# Patient Record
Sex: Female | Born: 1935 | Race: White | Hispanic: No | Marital: Married | Smoking: Never smoker
Health system: Southern US, Community
[De-identification: ages and names within clinical notes are randomized; demographics above are authoritative.]

## PROBLEM LIST (undated history)

## (undated) DIAGNOSIS — M1990 Unspecified osteoarthritis, unspecified site: Secondary | ICD-10-CM

## (undated) DIAGNOSIS — G709 Myoneural disorder, unspecified: Secondary | ICD-10-CM

## (undated) DIAGNOSIS — E785 Hyperlipidemia, unspecified: Secondary | ICD-10-CM

## (undated) DIAGNOSIS — R222 Localized swelling, mass and lump, trunk: Secondary | ICD-10-CM

## (undated) DIAGNOSIS — F419 Anxiety disorder, unspecified: Secondary | ICD-10-CM

## (undated) DIAGNOSIS — C801 Malignant (primary) neoplasm, unspecified: Secondary | ICD-10-CM

## (undated) HISTORY — DX: Malignant (primary) neoplasm, unspecified: C80.1

## (undated) HISTORY — PX: ABDOMINAL HYSTERECTOMY: SHX81

## (undated) HISTORY — PX: JOINT REPLACEMENT: SHX530

## (undated) HISTORY — DX: Unspecified osteoarthritis, unspecified site: M19.90

## (undated) HISTORY — DX: Anxiety disorder, unspecified: F41.9

## (undated) HISTORY — DX: Localized swelling, mass and lump, trunk: R22.2

## (undated) HISTORY — DX: Hyperlipidemia, unspecified: E78.5

## (undated) HISTORY — PX: CHOLECYSTECTOMY: SHX55

## (undated) HISTORY — PX: APPENDECTOMY: SHX54

## (undated) HISTORY — PX: EYE SURGERY: SHX253

## (undated) HISTORY — DX: Myoneural disorder, unspecified: G70.9

## (undated) HISTORY — PX: BREAST SURGERY: SHX581

---

## 1998-07-09 ENCOUNTER — Encounter: Admission: RE | Admit: 1998-07-09 | Discharge: 1998-07-09 | Payer: Self-pay | Admitting: Family Medicine

## 1998-07-13 ENCOUNTER — Encounter: Admission: RE | Admit: 1998-07-13 | Discharge: 1998-07-13 | Payer: Self-pay | Admitting: Sports Medicine

## 1998-09-09 ENCOUNTER — Encounter: Admission: RE | Admit: 1998-09-09 | Discharge: 1998-09-09 | Payer: Self-pay | Admitting: Family Medicine

## 1998-09-27 ENCOUNTER — Ambulatory Visit (HOSPITAL_BASED_OUTPATIENT_CLINIC_OR_DEPARTMENT_OTHER): Admission: RE | Admit: 1998-09-27 | Discharge: 1998-09-27 | Payer: Self-pay | Admitting: *Deleted

## 1998-10-12 ENCOUNTER — Inpatient Hospital Stay (HOSPITAL_COMMUNITY): Admission: RE | Admit: 1998-10-12 | Discharge: 1998-10-13 | Payer: Self-pay | Admitting: *Deleted

## 1998-10-12 ENCOUNTER — Encounter: Payer: Self-pay | Admitting: *Deleted

## 1999-08-25 ENCOUNTER — Encounter: Admission: RE | Admit: 1999-08-25 | Discharge: 1999-08-25 | Payer: Self-pay | Admitting: Family Medicine

## 1999-09-01 ENCOUNTER — Encounter: Admission: RE | Admit: 1999-09-01 | Discharge: 1999-09-01 | Payer: Self-pay | Admitting: Sports Medicine

## 1999-09-01 ENCOUNTER — Encounter: Payer: Self-pay | Admitting: Sports Medicine

## 2000-01-12 ENCOUNTER — Encounter: Admission: RE | Admit: 2000-01-12 | Discharge: 2000-01-12 | Payer: Self-pay | Admitting: Hematology and Oncology

## 2000-01-12 ENCOUNTER — Encounter: Payer: Self-pay | Admitting: Hematology and Oncology

## 2000-06-21 ENCOUNTER — Encounter: Admission: RE | Admit: 2000-06-21 | Discharge: 2000-06-21 | Payer: Self-pay | Admitting: Family Medicine

## 2000-06-21 ENCOUNTER — Ambulatory Visit (HOSPITAL_COMMUNITY): Admission: RE | Admit: 2000-06-21 | Discharge: 2000-06-21 | Payer: Self-pay | Admitting: Family Medicine

## 2000-08-13 ENCOUNTER — Encounter: Admission: RE | Admit: 2000-08-13 | Discharge: 2000-08-13 | Payer: Self-pay | Admitting: Family Medicine

## 2000-09-13 ENCOUNTER — Encounter: Admission: RE | Admit: 2000-09-13 | Discharge: 2000-09-13 | Payer: Self-pay | Admitting: Hematology and Oncology

## 2000-09-13 ENCOUNTER — Encounter: Payer: Self-pay | Admitting: Hematology and Oncology

## 2001-01-24 ENCOUNTER — Ambulatory Visit (HOSPITAL_COMMUNITY): Admission: RE | Admit: 2001-01-24 | Discharge: 2001-01-24 | Payer: Self-pay | Admitting: Hematology and Oncology

## 2001-01-24 ENCOUNTER — Encounter: Payer: Self-pay | Admitting: Hematology and Oncology

## 2001-03-01 ENCOUNTER — Encounter: Admission: RE | Admit: 2001-03-01 | Discharge: 2001-03-01 | Payer: Self-pay | Admitting: Family Medicine

## 2001-03-14 ENCOUNTER — Encounter: Admission: RE | Admit: 2001-03-14 | Discharge: 2001-03-14 | Payer: Self-pay | Admitting: Family Medicine

## 2001-04-04 ENCOUNTER — Encounter: Admission: RE | Admit: 2001-04-04 | Discharge: 2001-04-04 | Payer: Self-pay | Admitting: Family Medicine

## 2001-04-12 ENCOUNTER — Encounter: Admission: RE | Admit: 2001-04-12 | Discharge: 2001-07-11 | Payer: Self-pay | Admitting: Family Medicine

## 2001-04-18 ENCOUNTER — Encounter: Admission: RE | Admit: 2001-04-18 | Discharge: 2001-04-18 | Payer: Self-pay | Admitting: Family Medicine

## 2001-05-03 ENCOUNTER — Encounter: Admission: RE | Admit: 2001-05-03 | Discharge: 2001-05-03 | Payer: Self-pay | Admitting: Family Medicine

## 2001-05-09 ENCOUNTER — Encounter: Admission: RE | Admit: 2001-05-09 | Discharge: 2001-05-09 | Payer: Self-pay | Admitting: Family Medicine

## 2001-08-06 ENCOUNTER — Encounter: Admission: RE | Admit: 2001-08-06 | Discharge: 2001-11-04 | Payer: Self-pay | Admitting: Family Medicine

## 2001-09-16 ENCOUNTER — Encounter: Admission: RE | Admit: 2001-09-16 | Discharge: 2001-09-16 | Payer: Self-pay | Admitting: Hematology and Oncology

## 2001-09-16 ENCOUNTER — Encounter: Payer: Self-pay | Admitting: Hematology and Oncology

## 2001-11-05 ENCOUNTER — Encounter: Admission: RE | Admit: 2001-11-05 | Discharge: 2002-02-03 | Payer: Self-pay | Admitting: Family Medicine

## 2002-02-04 ENCOUNTER — Encounter: Admission: RE | Admit: 2002-02-04 | Discharge: 2002-05-05 | Payer: Self-pay | Admitting: Family Medicine

## 2002-03-19 ENCOUNTER — Encounter: Admission: RE | Admit: 2002-03-19 | Discharge: 2002-03-19 | Payer: Self-pay | Admitting: Family Medicine

## 2002-03-25 ENCOUNTER — Encounter: Admission: RE | Admit: 2002-03-25 | Discharge: 2002-03-25 | Payer: Self-pay | Admitting: Family Medicine

## 2002-06-02 ENCOUNTER — Encounter: Admission: RE | Admit: 2002-06-02 | Discharge: 2002-08-12 | Payer: Self-pay | Admitting: Family Medicine

## 2002-09-29 ENCOUNTER — Encounter: Admission: RE | Admit: 2002-09-29 | Discharge: 2002-09-29 | Payer: Self-pay | Admitting: *Deleted

## 2002-09-29 ENCOUNTER — Encounter: Payer: Self-pay | Admitting: *Deleted

## 2003-01-12 ENCOUNTER — Encounter: Admission: RE | Admit: 2003-01-12 | Discharge: 2003-01-12 | Payer: Self-pay | Admitting: Family Medicine

## 2003-02-25 ENCOUNTER — Ambulatory Visit (HOSPITAL_COMMUNITY): Admission: RE | Admit: 2003-02-25 | Discharge: 2003-02-25 | Payer: Self-pay | Admitting: *Deleted

## 2003-02-25 ENCOUNTER — Encounter: Payer: Self-pay | Admitting: *Deleted

## 2003-04-20 ENCOUNTER — Encounter: Admission: RE | Admit: 2003-04-20 | Discharge: 2003-04-20 | Payer: Self-pay | Admitting: Family Medicine

## 2003-08-19 ENCOUNTER — Encounter: Admission: RE | Admit: 2003-08-19 | Discharge: 2003-08-19 | Payer: Self-pay | Admitting: Family Medicine

## 2003-09-03 ENCOUNTER — Encounter: Admission: RE | Admit: 2003-09-03 | Discharge: 2003-09-03 | Payer: Self-pay | Admitting: Sports Medicine

## 2003-10-02 ENCOUNTER — Encounter: Admission: RE | Admit: 2003-10-02 | Discharge: 2003-10-02 | Payer: Self-pay | Admitting: Oncology

## 2004-01-04 ENCOUNTER — Inpatient Hospital Stay (HOSPITAL_COMMUNITY): Admission: RE | Admit: 2004-01-04 | Discharge: 2004-01-08 | Payer: Self-pay | Admitting: Orthopedic Surgery

## 2004-02-05 ENCOUNTER — Other Ambulatory Visit: Admission: RE | Admit: 2004-02-05 | Discharge: 2004-02-05 | Payer: Self-pay | Admitting: Oncology

## 2004-04-25 ENCOUNTER — Encounter: Admission: RE | Admit: 2004-04-25 | Discharge: 2004-04-25 | Payer: Self-pay | Admitting: Family Medicine

## 2004-05-18 ENCOUNTER — Encounter: Admission: RE | Admit: 2004-05-18 | Discharge: 2004-05-18 | Payer: Self-pay | Admitting: Family Medicine

## 2004-05-24 ENCOUNTER — Encounter: Admission: RE | Admit: 2004-05-24 | Discharge: 2004-05-24 | Payer: Self-pay | Admitting: Family Medicine

## 2004-08-08 ENCOUNTER — Inpatient Hospital Stay (HOSPITAL_COMMUNITY): Admission: RE | Admit: 2004-08-08 | Discharge: 2004-08-11 | Payer: Self-pay | Admitting: Orthopedic Surgery

## 2004-09-07 ENCOUNTER — Ambulatory Visit: Payer: Self-pay | Admitting: Family Medicine

## 2004-10-04 ENCOUNTER — Encounter: Admission: RE | Admit: 2004-10-04 | Discharge: 2004-10-04 | Payer: Self-pay | Admitting: Oncology

## 2005-01-20 ENCOUNTER — Ambulatory Visit: Payer: Self-pay | Admitting: Oncology

## 2005-07-21 ENCOUNTER — Ambulatory Visit: Payer: Self-pay | Admitting: Oncology

## 2005-08-10 ENCOUNTER — Ambulatory Visit: Payer: Self-pay | Admitting: Family Medicine

## 2005-10-05 ENCOUNTER — Encounter: Admission: RE | Admit: 2005-10-05 | Discharge: 2005-10-05 | Payer: Self-pay | Admitting: Oncology

## 2005-11-24 ENCOUNTER — Ambulatory Visit: Payer: Self-pay | Admitting: Sports Medicine

## 2006-01-12 ENCOUNTER — Ambulatory Visit: Payer: Self-pay | Admitting: Oncology

## 2006-02-20 ENCOUNTER — Ambulatory Visit: Payer: Self-pay | Admitting: Family Medicine

## 2006-07-12 ENCOUNTER — Ambulatory Visit: Payer: Self-pay | Admitting: Oncology

## 2006-07-16 LAB — COMPREHENSIVE METABOLIC PANEL
AST: 25 U/L (ref 0–37)
Albumin: 4.3 g/dL (ref 3.5–5.2)
BUN: 13 mg/dL (ref 6–23)
CO2: 24 mEq/L (ref 19–32)
Calcium: 9.4 mg/dL (ref 8.4–10.5)
Chloride: 106 mEq/L (ref 96–112)
Glucose, Bld: 124 mg/dL — ABNORMAL HIGH (ref 70–99)
Potassium: 4.3 mEq/L (ref 3.5–5.3)

## 2006-07-16 LAB — CBC WITH DIFFERENTIAL/PLATELET
Basophils Absolute: 0.1 10*3/uL (ref 0.0–0.1)
EOS%: 2.3 % (ref 0.0–7.0)
Eosinophils Absolute: 0.2 10*3/uL (ref 0.0–0.5)
HCT: 41.9 % (ref 34.8–46.6)
HGB: 14.3 g/dL (ref 11.6–15.9)
MONO#: 0.6 10*3/uL (ref 0.1–0.9)
NEUT#: 2.5 10*3/uL (ref 1.5–6.5)
RDW: 13.6 % (ref 11.3–14.5)
WBC: 10.1 10*3/uL — ABNORMAL HIGH (ref 3.9–10.0)
lymph#: 6.7 10*3/uL — ABNORMAL HIGH (ref 0.9–3.3)

## 2006-07-16 LAB — LACTATE DEHYDROGENASE: LDH: 149 U/L (ref 94–250)

## 2006-08-16 ENCOUNTER — Ambulatory Visit: Payer: Self-pay | Admitting: Family Medicine

## 2006-09-12 ENCOUNTER — Encounter: Admission: RE | Admit: 2006-09-12 | Discharge: 2006-09-12 | Payer: Self-pay | Admitting: Gastroenterology

## 2006-10-08 ENCOUNTER — Ambulatory Visit: Payer: Self-pay | Admitting: Family Medicine

## 2006-10-08 ENCOUNTER — Encounter: Admission: RE | Admit: 2006-10-08 | Discharge: 2006-10-08 | Payer: Self-pay | Admitting: Oncology

## 2006-10-08 ENCOUNTER — Encounter: Admission: RE | Admit: 2006-10-08 | Discharge: 2006-10-08 | Payer: Self-pay | Admitting: Family Medicine

## 2006-12-27 DIAGNOSIS — M1990 Unspecified osteoarthritis, unspecified site: Secondary | ICD-10-CM | POA: Insufficient documentation

## 2006-12-27 DIAGNOSIS — L821 Other seborrheic keratosis: Secondary | ICD-10-CM

## 2006-12-27 DIAGNOSIS — E785 Hyperlipidemia, unspecified: Secondary | ICD-10-CM | POA: Insufficient documentation

## 2006-12-27 DIAGNOSIS — B351 Tinea unguium: Secondary | ICD-10-CM

## 2006-12-27 DIAGNOSIS — G2589 Other specified extrapyramidal and movement disorders: Secondary | ICD-10-CM

## 2007-01-10 ENCOUNTER — Ambulatory Visit: Payer: Self-pay | Admitting: Oncology

## 2007-01-15 LAB — CBC WITH DIFFERENTIAL/PLATELET
EOS%: 1.7 % (ref 0.0–7.0)
Eosinophils Absolute: 0.2 10*3/uL (ref 0.0–0.5)
MCV: 90.6 fL (ref 81.0–101.0)
MONO%: 5.8 % (ref 0.0–13.0)
NEUT#: 2.2 10*3/uL (ref 1.5–6.5)
RBC: 4.69 10*6/uL (ref 3.70–5.32)
RDW: 13.8 % (ref 11.3–14.5)

## 2007-01-15 LAB — COMPREHENSIVE METABOLIC PANEL
AST: 32 U/L (ref 0–37)
Albumin: 4.2 g/dL (ref 3.5–5.2)
Alkaline Phosphatase: 99 U/L (ref 39–117)
Potassium: 4.2 mEq/L (ref 3.5–5.3)
Sodium: 140 mEq/L (ref 135–145)
Total Protein: 7.1 g/dL (ref 6.0–8.3)

## 2007-01-23 ENCOUNTER — Telehealth: Payer: Self-pay | Admitting: Family Medicine

## 2007-01-28 ENCOUNTER — Ambulatory Visit: Payer: Self-pay | Admitting: Family Medicine

## 2007-03-11 ENCOUNTER — Encounter: Admission: RE | Admit: 2007-03-11 | Discharge: 2007-03-11 | Payer: Self-pay | Admitting: Sports Medicine

## 2007-03-11 ENCOUNTER — Ambulatory Visit: Payer: Self-pay | Admitting: Family Medicine

## 2007-03-11 ENCOUNTER — Telehealth: Payer: Self-pay | Admitting: *Deleted

## 2007-03-11 DIAGNOSIS — R05 Cough: Secondary | ICD-10-CM

## 2007-03-11 DIAGNOSIS — R059 Cough, unspecified: Secondary | ICD-10-CM | POA: Insufficient documentation

## 2007-03-20 ENCOUNTER — Ambulatory Visit: Payer: Self-pay | Admitting: Family Medicine

## 2007-04-01 ENCOUNTER — Telehealth: Payer: Self-pay | Admitting: Family Medicine

## 2007-04-01 ENCOUNTER — Telehealth: Payer: Self-pay | Admitting: *Deleted

## 2007-05-17 ENCOUNTER — Telehealth: Payer: Self-pay | Admitting: *Deleted

## 2007-05-24 ENCOUNTER — Ambulatory Visit: Payer: Self-pay | Admitting: Family Medicine

## 2007-06-07 ENCOUNTER — Telehealth (INDEPENDENT_AMBULATORY_CARE_PROVIDER_SITE_OTHER): Payer: Self-pay | Admitting: Family Medicine

## 2007-06-26 ENCOUNTER — Encounter (INDEPENDENT_AMBULATORY_CARE_PROVIDER_SITE_OTHER): Payer: Self-pay | Admitting: Family Medicine

## 2007-06-26 ENCOUNTER — Ambulatory Visit: Payer: Self-pay | Admitting: Family Medicine

## 2007-06-26 LAB — CONVERTED CEMR LAB

## 2007-06-27 ENCOUNTER — Telehealth (INDEPENDENT_AMBULATORY_CARE_PROVIDER_SITE_OTHER): Payer: Self-pay | Admitting: Family Medicine

## 2007-06-27 LAB — CONVERTED CEMR LAB
AST: 49 units/L — ABNORMAL HIGH (ref 0–37)
BUN: 14 mg/dL (ref 6–23)
Calcium: 9.2 mg/dL (ref 8.4–10.5)
Chloride: 106 meq/L (ref 96–112)
Creatinine, Ser: 0.7 mg/dL (ref 0.40–1.20)
Glucose, Bld: 137 mg/dL — ABNORMAL HIGH (ref 70–99)
HCT: 41.6 % (ref 36.0–46.0)
Hemoglobin: 13.8 g/dL (ref 12.0–15.0)
Platelets: 192 10*3/uL (ref 150–400)
Potassium: 4 meq/L (ref 3.5–5.3)
Sodium: 140 meq/L (ref 135–145)

## 2007-07-05 ENCOUNTER — Ambulatory Visit: Payer: Self-pay | Admitting: Oncology

## 2007-07-05 ENCOUNTER — Encounter: Admission: RE | Admit: 2007-07-05 | Discharge: 2007-07-05 | Payer: Self-pay | Admitting: Sports Medicine

## 2007-07-09 LAB — CBC WITH DIFFERENTIAL/PLATELET
BASO%: 1.1 % (ref 0.0–2.0)
EOS%: 2.1 % (ref 0.0–7.0)
MCH: 31.2 pg (ref 26.0–34.0)
MCHC: 34.7 g/dL (ref 32.0–36.0)
MCV: 89.8 fL (ref 81.0–101.0)
MONO%: 5.3 % (ref 0.0–13.0)
RBC: 4.48 10*6/uL (ref 3.70–5.32)
RDW: 13.4 % (ref 11.3–14.5)
lymph#: 6.9 10*3/uL — ABNORMAL HIGH (ref 0.9–3.3)

## 2007-07-09 LAB — COMPREHENSIVE METABOLIC PANEL
Albumin: 4.3 g/dL (ref 3.5–5.2)
Alkaline Phosphatase: 96 U/L (ref 39–117)
BUN: 9 mg/dL (ref 6–23)
CO2: 24 mEq/L (ref 19–32)
Glucose, Bld: 117 mg/dL — ABNORMAL HIGH (ref 70–99)
Total Bilirubin: 0.7 mg/dL (ref 0.3–1.2)
Total Protein: 7.2 g/dL (ref 6.0–8.3)

## 2007-07-09 LAB — MORPHOLOGY

## 2007-07-11 ENCOUNTER — Encounter: Admission: RE | Admit: 2007-07-11 | Discharge: 2007-07-11 | Payer: Self-pay | Admitting: Sports Medicine

## 2007-07-16 ENCOUNTER — Encounter (INDEPENDENT_AMBULATORY_CARE_PROVIDER_SITE_OTHER): Payer: Self-pay | Admitting: Family Medicine

## 2007-07-17 ENCOUNTER — Encounter (INDEPENDENT_AMBULATORY_CARE_PROVIDER_SITE_OTHER): Payer: Self-pay | Admitting: Family Medicine

## 2007-08-14 ENCOUNTER — Ambulatory Visit: Payer: Self-pay | Admitting: Family Medicine

## 2007-08-21 ENCOUNTER — Encounter (INDEPENDENT_AMBULATORY_CARE_PROVIDER_SITE_OTHER): Payer: Self-pay | Admitting: Family Medicine

## 2007-10-10 ENCOUNTER — Encounter: Admission: RE | Admit: 2007-10-10 | Discharge: 2007-10-10 | Payer: Self-pay | Admitting: Oncology

## 2007-10-28 ENCOUNTER — Encounter: Admission: RE | Admit: 2007-10-28 | Discharge: 2007-10-28 | Payer: Self-pay | Admitting: Oncology

## 2008-01-02 ENCOUNTER — Ambulatory Visit: Payer: Self-pay | Admitting: Oncology

## 2008-01-06 LAB — COMPREHENSIVE METABOLIC PANEL
ALT: 42 U/L — ABNORMAL HIGH (ref 0–35)
CO2: 22 mEq/L (ref 19–32)
Creatinine, Ser: 0.67 mg/dL (ref 0.40–1.20)
Glucose, Bld: 117 mg/dL — ABNORMAL HIGH (ref 70–99)
Total Bilirubin: 0.5 mg/dL (ref 0.3–1.2)

## 2008-01-06 LAB — CBC WITH DIFFERENTIAL/PLATELET
BASO%: 0.6 % (ref 0.0–2.0)
HCT: 40.8 % (ref 34.8–46.6)
LYMPH%: 68.6 % — ABNORMAL HIGH (ref 14.0–48.0)
MCHC: 34.6 g/dL (ref 32.0–36.0)
MCV: 91 fL (ref 81.0–101.0)
MONO#: 0.6 10*3/uL (ref 0.1–0.9)
MONO%: 5.6 % (ref 0.0–13.0)
NEUT%: 23.8 % — ABNORMAL LOW (ref 39.6–76.8)
Platelets: 158 10*3/uL (ref 145–400)
WBC: 10.6 10*3/uL — ABNORMAL HIGH (ref 3.9–10.0)

## 2008-01-06 LAB — LACTATE DEHYDROGENASE: LDH: 165 U/L (ref 94–250)

## 2008-01-13 ENCOUNTER — Encounter (INDEPENDENT_AMBULATORY_CARE_PROVIDER_SITE_OTHER): Payer: Self-pay | Admitting: Family Medicine

## 2008-02-18 ENCOUNTER — Ambulatory Visit: Payer: Self-pay | Admitting: Family Medicine

## 2008-02-20 ENCOUNTER — Encounter (INDEPENDENT_AMBULATORY_CARE_PROVIDER_SITE_OTHER): Payer: Self-pay | Admitting: *Deleted

## 2008-02-20 ENCOUNTER — Ambulatory Visit: Payer: Self-pay | Admitting: Family Medicine

## 2008-07-01 ENCOUNTER — Ambulatory Visit: Payer: Self-pay | Admitting: Oncology

## 2008-07-07 LAB — CBC WITH DIFFERENTIAL/PLATELET
Basophils Absolute: 0.1 10*3/uL (ref 0.0–0.1)
EOS%: 1.4 % (ref 0.0–7.0)
HCT: 43.8 % (ref 34.8–46.6)
HGB: 15 g/dL (ref 11.6–15.9)
MCH: 31.7 pg (ref 26.0–34.0)
MONO#: 0.6 10*3/uL (ref 0.1–0.9)
NEUT%: 20.4 % — ABNORMAL LOW (ref 39.6–76.8)
Platelets: 164 10*3/uL (ref 145–400)
lymph#: 8.3 10*3/uL — ABNORMAL HIGH (ref 0.9–3.3)

## 2008-07-07 LAB — COMPREHENSIVE METABOLIC PANEL
ALT: 72 U/L — ABNORMAL HIGH (ref 0–35)
AST: 62 U/L — ABNORMAL HIGH (ref 0–37)
Alkaline Phosphatase: 92 U/L (ref 39–117)
Glucose, Bld: 135 mg/dL — ABNORMAL HIGH (ref 70–99)
Sodium: 138 mEq/L (ref 135–145)
Total Bilirubin: 0.6 mg/dL (ref 0.3–1.2)
Total Protein: 7.3 g/dL (ref 6.0–8.3)

## 2008-07-14 ENCOUNTER — Encounter (INDEPENDENT_AMBULATORY_CARE_PROVIDER_SITE_OTHER): Payer: Self-pay | Admitting: Family Medicine

## 2008-07-22 ENCOUNTER — Ambulatory Visit: Payer: Self-pay | Admitting: Family Medicine

## 2008-07-22 DIAGNOSIS — R351 Nocturia: Secondary | ICD-10-CM

## 2008-07-22 DIAGNOSIS — R030 Elevated blood-pressure reading, without diagnosis of hypertension: Secondary | ICD-10-CM | POA: Insufficient documentation

## 2008-07-22 DIAGNOSIS — F438 Other reactions to severe stress: Secondary | ICD-10-CM

## 2008-07-27 ENCOUNTER — Ambulatory Visit: Payer: Self-pay | Admitting: Family Medicine

## 2008-07-27 ENCOUNTER — Encounter (INDEPENDENT_AMBULATORY_CARE_PROVIDER_SITE_OTHER): Payer: Self-pay | Admitting: Family Medicine

## 2008-07-27 LAB — CONVERTED CEMR LAB
Cholesterol: 231 mg/dL — ABNORMAL HIGH (ref 0–200)
HDL: 50 mg/dL (ref >39)
Total CHOL/HDL Ratio: 4.6
Triglycerides: 159 mg/dL — ABNORMAL HIGH (ref <150)

## 2008-08-07 ENCOUNTER — Encounter (INDEPENDENT_AMBULATORY_CARE_PROVIDER_SITE_OTHER): Payer: Self-pay | Admitting: Family Medicine

## 2008-11-03 ENCOUNTER — Encounter: Admission: RE | Admit: 2008-11-03 | Discharge: 2008-11-03 | Payer: Self-pay | Admitting: Oncology

## 2008-11-10 ENCOUNTER — Encounter: Admission: RE | Admit: 2008-11-10 | Discharge: 2008-11-10 | Payer: Self-pay | Admitting: Oncology

## 2008-11-10 ENCOUNTER — Encounter (INDEPENDENT_AMBULATORY_CARE_PROVIDER_SITE_OTHER): Payer: Self-pay | Admitting: Diagnostic Radiology

## 2008-11-18 ENCOUNTER — Encounter (INDEPENDENT_AMBULATORY_CARE_PROVIDER_SITE_OTHER): Payer: Self-pay | Admitting: Family Medicine

## 2008-12-04 DIAGNOSIS — C50919 Malignant neoplasm of unspecified site of unspecified female breast: Secondary | ICD-10-CM | POA: Insufficient documentation

## 2008-12-07 ENCOUNTER — Encounter (INDEPENDENT_AMBULATORY_CARE_PROVIDER_SITE_OTHER): Payer: Self-pay | Admitting: Surgery

## 2008-12-07 ENCOUNTER — Ambulatory Visit (HOSPITAL_COMMUNITY): Admission: RE | Admit: 2008-12-07 | Discharge: 2008-12-08 | Payer: Self-pay | Admitting: Surgery

## 2008-12-14 ENCOUNTER — Ambulatory Visit: Payer: Self-pay | Admitting: Oncology

## 2008-12-16 ENCOUNTER — Encounter (INDEPENDENT_AMBULATORY_CARE_PROVIDER_SITE_OTHER): Payer: Self-pay | Admitting: Family Medicine

## 2008-12-29 ENCOUNTER — Encounter (INDEPENDENT_AMBULATORY_CARE_PROVIDER_SITE_OTHER): Payer: Self-pay | Admitting: Family Medicine

## 2008-12-29 LAB — COMPREHENSIVE METABOLIC PANEL
ALT: 46 U/L — ABNORMAL HIGH (ref 0–35)
Alkaline Phosphatase: 92 U/L (ref 39–117)
CO2: 27 mEq/L (ref 19–32)
Creatinine, Ser: 0.69 mg/dL (ref 0.40–1.20)
Total Bilirubin: 0.5 mg/dL (ref 0.3–1.2)

## 2008-12-29 LAB — CBC WITH DIFFERENTIAL/PLATELET
BASO%: 0.7 % (ref 0.0–2.0)
EOS%: 2 % (ref 0.0–7.0)
HCT: 41.7 % (ref 34.8–46.6)
LYMPH%: 71.1 % — ABNORMAL HIGH (ref 14.0–49.7)
MCH: 31.6 pg (ref 25.1–34.0)
MCHC: 34 g/dL (ref 31.5–36.0)
MCV: 92.8 fL (ref 79.5–101.0)
MONO#: 0.5 10*3/uL (ref 0.1–0.9)
MONO%: 4.1 % (ref 0.0–14.0)
NEUT%: 22.1 % — ABNORMAL LOW (ref 38.4–76.8)
Platelets: 197 10*3/uL (ref 145–400)

## 2008-12-29 LAB — LACTATE DEHYDROGENASE: LDH: 159 U/L (ref 94–250)

## 2009-01-07 ENCOUNTER — Encounter: Admission: RE | Admit: 2009-01-07 | Discharge: 2009-01-07 | Payer: Self-pay | Admitting: Oncology

## 2009-01-29 ENCOUNTER — Ambulatory Visit: Payer: Self-pay | Admitting: Oncology

## 2009-01-29 ENCOUNTER — Encounter (INDEPENDENT_AMBULATORY_CARE_PROVIDER_SITE_OTHER): Payer: Self-pay | Admitting: Family Medicine

## 2009-02-01 LAB — LIPID PANEL
Cholesterol: 210 mg/dL — ABNORMAL HIGH (ref 0–200)
HDL: 44 mg/dL (ref >39)
Total CHOL/HDL Ratio: 4.8 Ratio
Triglycerides: 166 mg/dL — ABNORMAL HIGH (ref <150)
VLDL: 33 mg/dL (ref 0–40)

## 2009-02-02 ENCOUNTER — Encounter (INDEPENDENT_AMBULATORY_CARE_PROVIDER_SITE_OTHER): Payer: Self-pay | Admitting: Family Medicine

## 2009-02-11 ENCOUNTER — Ambulatory Visit: Payer: Self-pay | Admitting: Family Medicine

## 2009-02-11 DIAGNOSIS — E559 Vitamin D deficiency, unspecified: Secondary | ICD-10-CM | POA: Insufficient documentation

## 2009-03-03 ENCOUNTER — Encounter (INDEPENDENT_AMBULATORY_CARE_PROVIDER_SITE_OTHER): Payer: Self-pay | Admitting: Family Medicine

## 2009-03-03 ENCOUNTER — Ambulatory Visit: Payer: Self-pay | Admitting: Family Medicine

## 2009-03-10 ENCOUNTER — Encounter (INDEPENDENT_AMBULATORY_CARE_PROVIDER_SITE_OTHER): Payer: Self-pay | Admitting: Family Medicine

## 2009-03-11 ENCOUNTER — Ambulatory Visit: Payer: Self-pay | Admitting: Family Medicine

## 2009-06-04 ENCOUNTER — Ambulatory Visit: Payer: Self-pay | Admitting: Oncology

## 2009-06-08 ENCOUNTER — Encounter: Payer: Self-pay | Admitting: Family Medicine

## 2009-06-08 LAB — COMPREHENSIVE METABOLIC PANEL
ALT: 56 U/L — ABNORMAL HIGH (ref 0–35)
AST: 58 U/L — ABNORMAL HIGH (ref 0–37)
Creatinine, Ser: 0.64 mg/dL (ref 0.40–1.20)
Total Bilirubin: 0.6 mg/dL (ref 0.3–1.2)

## 2009-06-08 LAB — CBC WITH DIFFERENTIAL/PLATELET
BASO%: 0.6 % (ref 0.0–2.0)
EOS%: 1.9 % (ref 0.0–7.0)
HCT: 41 % (ref 34.8–46.6)
LYMPH%: 70.2 % — ABNORMAL HIGH (ref 14.0–49.7)
MCH: 31.4 pg (ref 25.1–34.0)
MCHC: 33.6 g/dL (ref 31.5–36.0)
MCV: 93.3 fL (ref 79.5–101.0)
MONO%: 5 % (ref 0.0–14.0)
NEUT%: 22.3 % — ABNORMAL LOW (ref 38.4–76.8)
Platelets: 136 10*3/uL — ABNORMAL LOW (ref 145–400)

## 2009-07-22 ENCOUNTER — Encounter: Payer: Self-pay | Admitting: Family Medicine

## 2009-07-22 ENCOUNTER — Ambulatory Visit: Payer: Self-pay | Admitting: Family Medicine

## 2009-07-22 DIAGNOSIS — G4700 Insomnia, unspecified: Secondary | ICD-10-CM

## 2009-07-22 DIAGNOSIS — R5381 Other malaise: Secondary | ICD-10-CM | POA: Insufficient documentation

## 2009-07-22 DIAGNOSIS — T678XXA Other effects of heat and light, initial encounter: Secondary | ICD-10-CM

## 2009-07-22 DIAGNOSIS — R5383 Other fatigue: Secondary | ICD-10-CM

## 2009-08-06 ENCOUNTER — Ambulatory Visit: Payer: Self-pay | Admitting: Family Medicine

## 2009-09-27 ENCOUNTER — Ambulatory Visit: Payer: Self-pay | Admitting: Oncology

## 2009-09-27 LAB — CBC WITH DIFFERENTIAL/PLATELET
Basophils Absolute: 0 10*3/uL (ref 0.0–0.1)
Eosinophils Absolute: 0.2 10*3/uL (ref 0.0–0.5)
HGB: 13.9 g/dL (ref 11.6–15.9)
LYMPH%: 62.3 % — ABNORMAL HIGH (ref 14.0–49.7)
MCV: 94.8 fL (ref 79.5–101.0)
MONO%: 5.4 % (ref 0.0–14.0)
NEUT#: 3.2 10*3/uL (ref 1.5–6.5)
Platelets: 147 10*3/uL (ref 145–400)
RDW: 13.7 % (ref 11.2–14.5)

## 2009-09-27 LAB — MORPHOLOGY

## 2009-09-28 LAB — COMPREHENSIVE METABOLIC PANEL
ALT: 49 U/L — ABNORMAL HIGH (ref 0–35)
Albumin: 4 g/dL (ref 3.5–5.2)
Alkaline Phosphatase: 98 U/L (ref 39–117)
Glucose, Bld: 139 mg/dL — ABNORMAL HIGH (ref 70–99)
Potassium: 4 mEq/L (ref 3.5–5.3)
Sodium: 141 mEq/L (ref 135–145)
Total Protein: 6.9 g/dL (ref 6.0–8.3)

## 2009-09-28 LAB — VITAMIN D 25 HYDROXY (VIT D DEFICIENCY, FRACTURES): Vit D, 25-Hydroxy: 31 ng/mL (ref 30–89)

## 2009-10-04 ENCOUNTER — Encounter: Payer: Self-pay | Admitting: Family Medicine

## 2010-01-10 ENCOUNTER — Encounter: Admission: RE | Admit: 2010-01-10 | Discharge: 2010-01-10 | Payer: Self-pay | Admitting: Oncology

## 2010-01-31 ENCOUNTER — Ambulatory Visit: Payer: Self-pay | Admitting: Oncology

## 2010-02-01 ENCOUNTER — Ambulatory Visit: Payer: Self-pay | Admitting: Family Medicine

## 2010-02-01 ENCOUNTER — Inpatient Hospital Stay (HOSPITAL_COMMUNITY): Admission: EM | Admit: 2010-02-01 | Discharge: 2010-02-02 | Payer: Self-pay | Admitting: Family Medicine

## 2010-02-01 ENCOUNTER — Ambulatory Visit (HOSPITAL_COMMUNITY): Admission: RE | Admit: 2010-02-01 | Discharge: 2010-02-01 | Payer: Self-pay | Admitting: Family Medicine

## 2010-02-01 DIAGNOSIS — R0789 Other chest pain: Secondary | ICD-10-CM | POA: Insufficient documentation

## 2010-02-01 DIAGNOSIS — R259 Unspecified abnormal involuntary movements: Secondary | ICD-10-CM | POA: Insufficient documentation

## 2010-02-01 DIAGNOSIS — I499 Cardiac arrhythmia, unspecified: Secondary | ICD-10-CM | POA: Insufficient documentation

## 2010-02-01 LAB — CONVERTED CEMR LAB
Glucose, Urine, Semiquant: NEGATIVE
Nitrite: NEGATIVE
Protein, U semiquant: NEGATIVE
Specific Gravity, Urine: 1.02
WBC Urine, dipstick: NEGATIVE
pH: 6

## 2010-02-02 ENCOUNTER — Encounter: Payer: Self-pay | Admitting: Family Medicine

## 2010-02-04 ENCOUNTER — Encounter: Payer: Self-pay | Admitting: Family Medicine

## 2010-02-04 LAB — CBC WITH DIFFERENTIAL/PLATELET
EOS%: 1.6 % (ref 0.0–7.0)
Eosinophils Absolute: 0.2 10*3/uL (ref 0.0–0.5)
LYMPH%: 68.5 % — ABNORMAL HIGH (ref 14.0–49.7)
MCH: 32.6 pg (ref 25.1–34.0)
MCHC: 34.6 g/dL (ref 31.5–36.0)
MCV: 94.1 fL (ref 79.5–101.0)
MONO%: 6.2 % (ref 0.0–14.0)
NEUT#: 2.7 10*3/uL (ref 1.5–6.5)
Platelets: 127 10*3/uL — ABNORMAL LOW (ref 145–400)
RBC: 4.32 10*6/uL (ref 3.70–5.45)
RDW: 13.4 % (ref 11.2–14.5)

## 2010-02-04 LAB — VITAMIN D 25 HYDROXY (VIT D DEFICIENCY, FRACTURES): Vit D, 25-Hydroxy: 28 ng/mL — ABNORMAL LOW (ref 30–89)

## 2010-02-08 ENCOUNTER — Ambulatory Visit: Payer: Self-pay | Admitting: Family Medicine

## 2010-02-08 ENCOUNTER — Encounter: Payer: Self-pay | Admitting: Family Medicine

## 2010-02-08 DIAGNOSIS — E162 Hypoglycemia, unspecified: Secondary | ICD-10-CM | POA: Insufficient documentation

## 2010-03-08 ENCOUNTER — Telehealth: Payer: Self-pay | Admitting: Family Medicine

## 2010-05-27 ENCOUNTER — Encounter: Payer: Self-pay | Admitting: Family Medicine

## 2010-06-02 ENCOUNTER — Ambulatory Visit: Payer: Self-pay | Admitting: Oncology

## 2010-06-06 ENCOUNTER — Encounter: Payer: Self-pay | Admitting: Family Medicine

## 2010-06-06 LAB — COMPREHENSIVE METABOLIC PANEL
ALT: 53 U/L — ABNORMAL HIGH (ref 0–35)
AST: 65 U/L — ABNORMAL HIGH (ref 0–37)
Albumin: 4.1 g/dL (ref 3.5–5.2)
Alkaline Phosphatase: 93 U/L (ref 39–117)
BUN: 9 mg/dL (ref 6–23)
Calcium: 9.2 mg/dL (ref 8.4–10.5)
Chloride: 105 mEq/L (ref 96–112)
Potassium: 4.1 mEq/L (ref 3.5–5.3)
Sodium: 139 mEq/L (ref 135–145)
Total Protein: 6.8 g/dL (ref 6.0–8.3)

## 2010-06-06 LAB — CBC WITH DIFFERENTIAL/PLATELET
Basophils Absolute: 0.1 10*3/uL (ref 0.0–0.1)
EOS%: 2 % (ref 0.0–7.0)
Eosinophils Absolute: 0.2 10*3/uL (ref 0.0–0.5)
MCH: 32.3 pg (ref 25.1–34.0)
MCV: 94.5 fL (ref 79.5–101.0)
NEUT%: 22.5 % — ABNORMAL LOW (ref 38.4–76.8)
WBC: 9.8 10*3/uL (ref 3.9–10.3)
lymph#: 6.7 10*3/uL — ABNORMAL HIGH (ref 0.9–3.3)

## 2010-06-06 LAB — MORPHOLOGY

## 2010-06-22 ENCOUNTER — Encounter: Admission: RE | Admit: 2010-06-22 | Discharge: 2010-06-22 | Payer: Self-pay | Admitting: Emergency Medicine

## 2010-06-22 ENCOUNTER — Ambulatory Visit: Payer: Self-pay | Admitting: Family Medicine

## 2010-06-22 DIAGNOSIS — R209 Unspecified disturbances of skin sensation: Secondary | ICD-10-CM | POA: Insufficient documentation

## 2010-06-22 DIAGNOSIS — M479 Spondylosis, unspecified: Secondary | ICD-10-CM | POA: Insufficient documentation

## 2010-06-23 ENCOUNTER — Telehealth: Payer: Self-pay | Admitting: *Deleted

## 2010-08-09 ENCOUNTER — Ambulatory Visit: Payer: Self-pay | Admitting: Family Medicine

## 2010-10-06 ENCOUNTER — Ambulatory Visit: Payer: Self-pay | Admitting: Oncology

## 2010-10-10 ENCOUNTER — Encounter: Payer: Self-pay | Admitting: Family Medicine

## 2010-10-10 LAB — CBC WITH DIFFERENTIAL/PLATELET
BASO%: 1.9 % (ref 0.0–2.0)
EOS%: 2.1 % (ref 0.0–7.0)
HCT: 41.3 % (ref 34.8–46.6)
MCH: 32.6 pg (ref 25.1–34.0)
MCHC: 34.5 g/dL (ref 31.5–36.0)
MONO#: 0.7 10*3/uL (ref 0.1–0.9)
NEUT%: 25.7 % — ABNORMAL LOW (ref 38.4–76.8)
RBC: 4.38 10*6/uL (ref 3.70–5.45)
WBC: 9.6 10*3/uL (ref 3.9–10.3)
lymph#: 6.1 10*3/uL — ABNORMAL HIGH (ref 0.9–3.3)

## 2010-10-10 LAB — MORPHOLOGY: RBC Comments: NORMAL

## 2010-10-11 LAB — COMPREHENSIVE METABOLIC PANEL
Albumin: 4.1 g/dL (ref 3.5–5.2)
BUN: 11 mg/dL (ref 6–23)
Calcium: 9.6 mg/dL (ref 8.4–10.5)
Chloride: 104 mEq/L (ref 96–112)
Creatinine, Ser: 0.6 mg/dL (ref 0.40–1.20)
Glucose, Bld: 103 mg/dL — ABNORMAL HIGH (ref 70–99)
Potassium: 4.1 mEq/L (ref 3.5–5.3)

## 2010-10-11 LAB — VITAMIN D 25 HYDROXY (VIT D DEFICIENCY, FRACTURES): Vit D, 25-Hydroxy: 32 ng/mL (ref 30–89)

## 2010-11-19 ENCOUNTER — Other Ambulatory Visit: Payer: Self-pay | Admitting: Oncology

## 2010-11-19 DIAGNOSIS — Z79899 Other long term (current) drug therapy: Secondary | ICD-10-CM

## 2010-11-20 ENCOUNTER — Encounter: Payer: Self-pay | Admitting: Oncology

## 2010-11-25 ENCOUNTER — Encounter: Payer: Self-pay | Admitting: Family Medicine

## 2010-11-28 ENCOUNTER — Telehealth: Payer: Self-pay | Admitting: Family Medicine

## 2010-11-29 NOTE — Consult Note (Signed)
Summary: [facility] Hematology/Oncology  [facility] Hematology/Oncology   Imported By: Raymond Gurney 11/10/2009 14:09:20  _____________________________________________________________________  External Attachment:    Type:   Image     Comment:   External Document

## 2010-11-29 NOTE — Consult Note (Signed)
Summary: [facility] Hem/Onc  [facility]/Onc   Imported By: Raymond Gurney 06/23/2010 11:06:36  _____________________________________________________________________  External Attachment:    Type:   Image     Comment:   External Document

## 2010-11-29 NOTE — Progress Notes (Signed)
Summary: Rx Req: Lorazepam  Phone Note Refill Request Call back at Home Phone [phone] Message from:  Patient  Refills Requested: Medication #1:  LORAZEPAM 0.5 MG TABS 1 tab by mouth q 8 hrs as needed travel anxiety [facility].  Initial call taken by: Raymond Gurney,  Mar 08, 2010 9:30 AM    Prescriptions: LORAZEPAM 0.5 MG TABS (LORAZEPAM) 1 tab by mouth q 8 hrs as needed travel anxiety  #30 x 5   Entered and Authorized by:   Esmeralda Arthur MD   Signed by:   Esmeralda Arthur MD on 03/08/2010   Method used:   Historical   RxID:   [id number]  [place] Refill form back to refill this med. Esmeralda Arthur MD  Mar 08, 2010 10:07 AM

## 2010-11-29 NOTE — Assessment & Plan Note (Signed)
Summary: neck pain,df   Vital Signs:  Patient profile:   77 year old female Height:      61 inches Weight:      161.31 pounds BMI:     30.59 Pulse rate:   76 / minute BP sitting:   139 / 74  (left arm)  Vitals Entered By: Hedy Camara (June 22, 2010 9:33 AM) CC: tingling sensation in left side of neck x 6 weeks Is Patient Diabetic? No Pain Assessment Patient in pain? no        Primary Care Provider:  Esmeralda Arthur MD  CC:  tingling sensation in left side of neck x 6 weeks.  History of Present Illness: 77 yo F:  1. Neck Numbness/Tingling: left side, x 6 weeks, worse with reading and praying (neck flexion), no neck pain, no injury. She has a PMHx of OA. She has not tried anything for treatment. No bowel/bladder incontinence.   Habits & Providers  Alcohol-Tobacco-Diet     Tobacco Status: never  Current Medications (verified): 1)  Aspirin Ec 81 Mg Tbec (Aspirin) .... Take 1 Tablet By Mouth Every Morning 2)  Lorazepam 0.5 Mg Tabs (Lorazepam) .Marland Kitchen.. 1 Tab By Mouth Q 8 Hrs As Needed Travel Anxiety 3)  Femara 2.5 Mg Tabs (Letrozole) .Marland Kitchen.. 1 Tab By Mouth Daily 4)  Tylenol Pm Extra Strength 500-25 Mg Tabs (Diphenhydramine-Apap (Sleep)) .... At Bedtime As Needed 5)  Acetaminophen 500 Mg Tabs (Acetaminophen) .... As Needed 6)  Calcium 1200 1200-1000 Mg-Unit Chew (Calcium Carbonate-Vit D-Min) .... Take 1 Pill A Day (Not Chew). 7)  Ambien 10 Mg Tabs (Zolpidem Tartrate) .... Take One At Night For Insomnia 8)  Vitamin D-1000 Max St [phone] Mg-Unit Tabs (Calcium-Vitamin D) .... Take 1 A Day  Allergies (verified): 1)  ! Codeine PMH-FH-SH reviewed for relevance  Review of Systems General:  Denies chills and fever. CV:  Denies chest pain or discomfort, lightheadness, palpitations, and shortness of breath with exertion. MS:  Denies joint pain, joint redness, joint swelling, and loss of strength. Derm:  Denies lesion(s) and rash. Neuro:  Complains of numbness and tingling; denies  falling down, headaches, and visual disturbances.  Physical Exam  General:  Well-developed, well-nourished, in no acute distress; alert, appropriate and cooperative throughout examination. Vitals reviewed and WNL. Head:  Normocephalic and atraumatic without obvious abnormalities.  Eyes:  No corneal or conjunctival inflammation noted. EOMI. Perrla.  Ears:  R ear normal and L ear normal.   Nose:  External nasal examination shows no deformity or inflammation. Nasal mucosa are pink and moist without lesions or exudates. Mouth:  Oral mucosa and oropharynx without lesions or exudates.   Neck:  No deformities, masses, or tenderness noted. Heart:  Normal rate and regular rhythm. S1 and S2 normal without gallop, murmur, click, rub or other extra sounds. No bruit. Msk:  Neck with fairly normal ROM. Symptoms manifest with flexion and palpation along left cervical paraspinal mm. + crepitus with movement.  Neurologic:  Alert & oriented X3, cranial nerves II-XII intact, strength normal in all extremities, sensation intact to light touch, and DTRs symmetrical and normal.   Skin:  Intact without suspicious lesions or rashes. Psych:  Oriented X3, memory intact for recent and remote, normally interactive, good eye contact, not anxious appearing, and not depressed appearing.     Impression & Recommendations:  Problem # 1:  DISTURBANCE OF SKIN SENSATION (ICD-782.0) Assessment New  Suspect cervical spine arthritis. No red flags. Will obtain XRAY. Will order PT - can  be done at patient's living facility.  Orders: [place]- Est Level  3 (65681)  Problem # 2:  ARTHRITIS, CERVICAL SPINE (ICD-721.90) Assessment: New  Orders: Diagnostic X-Ray/Fluoroscopy (Diagnostic X-Ray/Flu) [place]- Est Level  3 (27517)  Complete Medication List: 1)  Aspirin Ec 81 Mg Tbec (Aspirin) .... Take 1 tablet by mouth every morning 2)  Lorazepam 0.5 Mg Tabs (Lorazepam) .Marland Kitchen.. 1 tab by mouth q 8 hrs as needed travel anxiety 3)  Femara 2.5  Mg Tabs (Letrozole) .Marland Kitchen.. 1 tab by mouth daily 4)  Tylenol Pm Extra Strength 500-25 Mg Tabs (Diphenhydramine-apap (sleep)) .... At bedtime as needed 5)  Acetaminophen 500 Mg Tabs (Acetaminophen) .... As needed 6)  Calcium 1200 1200-1000 Mg-unit Chew (Calcium carbonate-vit d-min) .... Take 1 pill a day (not chew). 7)  Ambien 10 Mg Tabs (Zolpidem tartrate) .... Take one at night for insomnia 8)  Vitamin D-1000 Max St [phone] Mg-unit Tabs (Calcium-vitamin d) .... Take 1 a day  Patient Instructions: 1)  It was nice to meet you today! 2)  I am sending you for an xray of your neck. 3)  I recommend physical therapy and Ibuprofen.

## 2010-11-29 NOTE — Assessment & Plan Note (Signed)
Summary: hosp f/u, hypoglycemia?   Vital Signs:  Patient profile:   77 year old female Weight:      157.4 pounds Temp:     97.9 degrees F oral Pulse rate:   80 / minute Pulse rhythm:   regular BP sitting:   133 / 75  (left arm) Cuff size:   regular  Vitals Entered By: Audelia Hives CMA (February 08, 2010 9:01 AM)  Primary Care Provider:  Esmeralda Arthur MD  CC:  Hosp d/c follow up and ? hypoglycemia.  History of Present Illness: Chest discomfort: Pt comes in today after being in the hospital for a chest pain rule-out. She did not have an MI and her symptoms of chest tightness were not due to cardiac reasons.   Hypoglycemia: She still complains of an "ickish" feeling that she says she can not describe any other way. She says that she has this "ickish" feeling right before she should be eating and that eating makes it go away. I am concerned for hypoglycemia however we have not been able to test her when she is feeling bad. Discussed with patient keeping candy in her purse to increase her blood sugar and drinking plenty of water. Pt says she knows she does not drink enought water. Recommended 8-10 glasses a day.   Current Medications (verified): 1)  Aspirin Ec 81 Mg Tbec (Aspirin) .... Take 1 Tablet By Mouth Every Morning 2)  Lorazepam 0.5 Mg Tabs (Lorazepam) .Marland Kitchen.. 1 Tab By Mouth Q 8 Hrs As Needed Travel Anxiety 3)  Femara 2.5 Mg Tabs (Letrozole) .Marland Kitchen.. 1 Tab By Mouth Daily 4)  Tylenol Pm Extra Strength 500-25 Mg Tabs (Diphenhydramine-Apap (Sleep)) .... At Bedtime As Needed 5)  Acetaminophen 500 Mg Tabs (Acetaminophen) .... As Needed 6)  Calcium 1200 1200-1000 Mg-Unit Chew (Calcium Carbonate-Vit D-Min) .... Take 1 Pill A Day (Not Chew). 7)  Ambien 10 Mg Tabs (Zolpidem Tartrate) .... Take One At Night For Insomnia 8)  Vitamin D-1000 Max St [phone] Mg-Unit Tabs (Calcium-Vitamin D) .... Take 1 A Day  Allergies (verified): 1)  ! Codeine  Review of Systems        vitals reviewed and  pertinent negatives and positives seen in HPI   Physical Exam  General:  Well-developed,well-nourished,in no acute distress; alert,appropriate and cooperative throughout examination Psych:  Cognition and judgment appear intact. Alert and cooperative with normal attention span and concentration. No apparent delusions, illusions, hallucinations   Impression & Recommendations:  Problem # 1:  CHEST DISCOMFORT (ICD-786.59) Assessment Improved Pt was found to not have an MI. Plan to repeat EKG in 6 months to get a baseline when she is not having chest discomfort.   Orders: [place]- Est Level  3 (20254)  Problem # 2:  HYPOGLYCEMIA, UNSPECIFIED (ICD-251.2) Assessment: Unchanged Pt advised to keep candy or food in purse and drink plenty of water. Pt may be hypoglycemic or may be hypotensive.   Orders: [place]- Est Level  3 (27062)  Complete Medication List: 1)  Aspirin Ec 81 Mg Tbec (Aspirin) .... Take 1 tablet by mouth every morning 2)  Lorazepam 0.5 Mg Tabs (Lorazepam) .Marland Kitchen.. 1 tab by mouth q 8 hrs as needed travel anxiety 3)  Femara 2.5 Mg Tabs (Letrozole) .Marland Kitchen.. 1 tab by mouth daily 4)  Tylenol Pm Extra Strength 500-25 Mg Tabs (Diphenhydramine-apap (sleep)) .... At bedtime as needed 5)  Acetaminophen 500 Mg Tabs (Acetaminophen) .... As needed 6)  Calcium 1200 1200-1000 Mg-unit Chew (Calcium carbonate-vit d-min) .... Take 1 pill  a day (not chew). 7)  Ambien 10 Mg Tabs (Zolpidem tartrate) .... Take one at night for insomnia 8)  Vitamin D-1000 Max St [phone] Mg-unit Tabs (Calcium-vitamin d) .... Take 1 a day  Patient Instructions: 1)  I recommend 6 small meals a day or snacks with sugar in your purse to keep from having hypoglycemia 2)  Drink 8-10 glasses of water a day.

## 2010-11-29 NOTE — Assessment & Plan Note (Signed)
Summary: hospital H & P   Vital Signs:  Patient profile:   77 year old female Weight:      157.2 pounds Temp:     97.9 degrees F oral Pulse rate:   80 / minute BP sitting:   153 / 74  (left arm) Cuff size:   regular  Vitals Entered By: Audelia Hives CMA (February 01, 2010 10:17 AM)   Primary Care Provider:  Esmeralda Arthur MD   History of Present Illness: 77 y/o F with h/o bilateral breast Ca s/p bilateral mastectomies, hyperlipidemia, and hyperglycemia. Pt has had about 9 days of chest tightness at night. She describes it as a fullness that she feels in her chest after dinner at night. She has some feelings of nausea but it is relieved with eating. She denies chest pain. She does not have shortness of breath. There is no change with exertion. She has no radiation of chest tightness or diaphoresis but says she is always hot. She has had a cough for the last 9 days. She has no swelling in her lower extremities.   She has a h/o hyperlipidemia and hyperglycemia. Not currently on meds for either one.  She also has CLL that is currently untreated. Her Oncologist is Dr. Claudette Head. She is suppose to have an appointment with her oncologist tomorrow at 11am.  Pt has a h/o breast ca and was treated with mastectomies and Tamoxafin, currently treated with Femera.   Current Medications (verified): 1)  Aspirin Ec 81 Mg Tbec (Aspirin) .... Take 1 Tablet By Mouth Every Morning 2)  Lorazepam 0.5 Mg Tabs (Lorazepam) .Marland Kitchen.. 1 Tab By Mouth Q 8 Hrs As Needed Travel Anxiety 3)  Femara 2.5 Mg Tabs (Letrozole) .Marland Kitchen.. 1 Tab By Mouth Daily 4)  Tylenol Pm Extra Strength 500-25 Mg Tabs (Diphenhydramine-Apap (Sleep)) .... At Bedtime As Needed 5)  Acetaminophen 500 Mg Tabs (Acetaminophen) .... As Needed 6)  Calcium 1200 1200-1000 Mg-Unit Chew (Calcium Carbonate-Vit D-Min) .... Take 1 Pill A Day (Not Chew).  Allergies (verified): 1)  ! Codeine  Past History:  Past Medical History: Last updated: 07/22/2008 (invasive  ductal) s/p 5 yrs of tamoxifen,  CLL -- stage 0 dx`d 3/05,  L breast CA - s/p complete mastectomy 12/99 Benign pollyps on colonocopy 2008 ANXIETY, SITUATIONAL (ICD-308.3) SEBORRHEIC KERATOSIS, NOS (ICD-702.19) RESTLESS LEGS SYNDROME (ICD-333.99) OSTEOARTHRITIS, MULTI SITES (ICD-715.98) ONYCHOMYCOSIS (ICD-110.1) HYPERLIPIDEMIA (ICD-272.4) DJD, UNSPECIFIED (ICD-715.90) BLOOD IN STOOL, MELENA (ICD-578.1)    Past Surgical History: Last updated: 05/24/2007 Appendectomy - 08/17/2000 Cholecystectomy -,  Hysterectomy & BSO -03/03/04 Mastectomy (left) - 09/29/1998, right TKA - 12/29/2003, TAH/BSO - 03/03/2004 T-score = +1.08 - 03/26/2002  Family History: Last updated: 12/27/2006 DM in mother and father., + HTN in sister, + RA in mother  Social History: Last updated: 07/22/2008 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  No etoh, tob abuse. Divorced x 30 yrs. Remarried 2004. 3 sons, 1 daughter. Lives at [facility]  Risk Factors: Smoking Status: never (07/22/2009)  Review of Systems        vitals reviewed and pertinent negatives and positives seen in HPI   Physical Exam  General:  Well-developed,well-nourished,in no acute distress; alert,appropriate and cooperative throughout examination Head:  Normocephalic and atraumatic without obvious abnormalities. No apparent alopecia or balding. Breasts:  bilateral scars at sights of breast, Rt breast scar puckered but no erythema. Lungs:  Normal respiratory effort, chest expands symmetrically. Lungs are clear to auscultation, no crackles or wheezes. Heart:  Normal rate and regular rhythm. S1 and S2 normal without gallop, murmur, click, rub or other extra sounds. Pulses:  R and L carotid,radial,femoral,dorsalis pedis and posterior tibial pulses are full and equal bilaterally Extremities:  No clubbing, cyanosis, edema, or deformity noted with normal full range of motion of all joints.   Skin:  Intact without suspicious lesions or  rashes Psych:  tearful and slightly anxious.     Impression & Recommendations:  Problem # 1:  CHEST DISCOMFORT (ICD-786.59) Pt has been having chest pressure for 9 days. She has EKG changes that show q waves in lead 1 and AVL. She also has peaked T waves in V2-4. No prior EKG in the last 5 years to compare.  Will get CE x3 q8 hours, will also repeat ECG in am as well as put her on Tele monitoring. Plan to also get a CXR in case this chest tightness is due to some changes in her lungs. Of note, the patient is concerned about her breast cancer returning or an infection in her lungs. Will get CBC to help eval for infection.   Problem # 2:  OTHER MALAISE AND FATIGUE (ICD-780.79) Pt has been feeling maliase and has c/o shaky hand movements before meals. I am concerned for hypoglycemia even though the patient has a h/o hyperglycemia. Her blood glucose was 113 here in the office today. I will check an A1c. Pt should also be monitored for hypoglycemia. Will let admit team know. Will get CMP to eval for other causes of fatigue.   Problem # 3:  VITAMIN D DEFICIENCY (ICD-268.9) Pt takes a Ca 1200 mg and Vit D 1000 International Units tablet every day. She was put on this by her oncologist. Pt can continue upon discharge.   Problem # 4:  INSOMNIA (ICD-780.52) Pt has h/o insomnia. She says the Tylenol PM is not helping much. Will start Trazedone qHS and Ambien PRN  that she can get while in the hospital.   Her updated medication list for this problem includes:    Tylenol Pm Extra Strength 500-25 Mg Tabs (Diphenhydramine-apap (sleep)) .Marland Kitchen... At bedtime as needed  Problem # 5:  NOCTURIA (IRW-431.54) PT is having increased urination at night and some during the day as well. She does not have a UTI based on her UA here in the office. She has a stong FH of diabetes. Concerned for diabetes so willed check A1c. May consider starting ditropan or other antispasmodic in the future.   Orders: Urinalysis-[facility]  (00000)  Problem # 6:  ANXIETY, SITUATIONAL (ICD-308.3) Pt states that being at the doctors office and going to the hosptial make her uncomfortable. She may have increased anxiety while in the hospital. She has home Lorazepam that works well for her so she can continue getting this in the hospital.   Problem # 7:  FEN/GI Pt can have a normal diet unless she has some elevation in her A1c at which time we will switch it over to a diabetic diet. She does not look dehydrated but says that she is urinating a lot so will gently rehydrated wtih IVF's at 100 cc/hr  Problem # 8:  Dispo Will d/c to home tomorrow if pt rules out for MI. Pt may need to be set up with Cardiology in the future.   Complete Medication List: 1)  Aspirin Ec 81 Mg Tbec (Aspirin) .... Take 1 tablet by mouth every morning 2)  Lorazepam 0.5 Mg Tabs (Lorazepam) .Marland Kitchen.. 1 tab by mouth q 8  hrs as needed travel anxiety 3)  Femara 2.5 Mg Tabs (Letrozole) .Marland Kitchen.. 1 tab by mouth daily 4)  Tylenol Pm Extra Strength 500-25 Mg Tabs (Diphenhydramine-apap (sleep)) .... At bedtime as needed 5)  Acetaminophen 500 Mg Tabs (Acetaminophen) .... As needed 6)  Calcium 1200 1200-1000 Mg-unit Chew (Calcium carbonate-vit d-min) .... Take 1 pill a day (not chew).  Other Orders: 12 Lead EKG (12 Lead EKG) Glucose Cap-[facility] (18343) Prescriptions: CALCIUM 1200 1200-1000 MG-UNIT CHEW (CALCIUM CARBONATE-VIT D-MIN) take 1 pill a day (not chew).  #31 x 0   Entered and Authorized by:   Esmeralda Arthur MD   Signed by:   Esmeralda Arthur MD on 02/01/2010   Method used:   Electronically to        [facility]  [phone]* (retail)       [place],   [address]       Ph: [phone] or [phone]       Fax: [phone]   RxID:   [id number]   Laboratory Results   Urine Tests  Date/Time Received: February 01, 2010 11:53  AM  Date/Time Reported: February 01, 2010 12:19 PM   Routine Urinalysis   Color: yellow Appearance:  Clear Glucose: negative   (Normal Range: Negative) Bilirubin: negative   (Normal Range: Negative) Ketone: negative   (Normal Range: Negative) Spec. Gravity: 1.020   (Normal Range: 1.003-1.035) Blood: negative   (Normal Range: Negative) pH: 6.0   (Normal Range: 5.0-8.0) Protein: negative   (Normal Range: Negative) Urobilinogen: 0.2   (Normal Range: 0-1) Nitrite: negative   (Normal Range: Negative) Leukocyte Esterace: negative   (Normal Range: Negative)    Comments: ...........test performed by...........Marland KitchenHedy Camara, CMA      Appended Document: hospital H & P    Clinical Lists Changes  Orders: Added new Test order of Hospital Admit-[facility] ([place]) - Signed      Appended Document: hospital H & P dict #  [phone]

## 2010-11-29 NOTE — Progress Notes (Signed)
Summary: phn msg  Phone Note Call from Patient Call back at Home Phone [phone]   Caller: Patient Summary of Call: pt is returning a call from someone. Initial call taken by: Audie Clear,  June 23, 2010 2:38 PM  Follow-up for Phone Call        lvm for pt Follow-up by: Audelia Hives CMA,  June 23, 2010 3:37 PM  Additional Follow-up for Phone Call Additional follow up Details #1::        spoke with pt and she will hv pt done at the place that she resides Additional Follow-up by: Audelia Hives CMA,  June 24, 2010 9:32 AM

## 2010-11-29 NOTE — Consult Note (Signed)
Summary: [facility]   Imported By: Raymond Gurney 06/10/2010 11:04:02  _____________________________________________________________________  External Attachment:    Type:   Image     Comment:   External Document

## 2010-11-29 NOTE — Miscellaneous (Signed)
  Clinical Lists Changes  Medications: Added new medication of AMBIEN 10 MG TABS (ZOLPIDEM TARTRATE) take one at night for insomnia - Signed Rx of AMBIEN 10 MG TABS (ZOLPIDEM TARTRATE) take one at night for insomnia;  #15 x 0;  Signed;  Entered by: Marlana Salvage MD;  Authorized by: Marlana Salvage MD;  Method used: Printed then faxed to [facility]  [phone]*, [address], [place], [place]  [address], Ph: [phone] or [phone], Fax: [phone]    Prescriptions: AMBIEN 10 MG TABS (ZOLPIDEM TARTRATE) take one at night for insomnia  #15 x 0   Entered and Authorized by:   Marlana Salvage MD   Signed by:   Marlana Salvage MD on 02/02/2010   Method used:   Printed then faxed to ...       [facility]  [phone]* (retail)       [address] [place], [place]  [address]       Ph: [phone] or [phone]       Fax: [phone]   RxID:   [id number]

## 2010-11-29 NOTE — Assessment & Plan Note (Signed)
Summary: flu shot/eo  Nurse Visit   Vital Signs:  Patient profile:   77 year old female Temp:     98.4 degrees F  Vitals Entered By: Marcell Barlow RN (August 09, 2010 11:05 AM)  Allergies: 1)  ! Codeine  Immunizations Administered:  Influenza Vaccine # 1:    Vaccine Type: Fluvax MCR    Site: left deltoid    Mfr: GlaxoSmithKline    Dose: 0.5 ml    Route: IM    Given by: Marcell Barlow RN    Exp. Date: 04/26/2011    Lot #: [id number]    VIS given: 05/24/10 version given August 09, 2010.  Flu Vaccine Consent Questions:    Do you have a history of severe allergic reactions to this vaccine? no    Any prior history of allergic reactions to egg and/or gelatin? no    Do you have a sensitivity to the preservative Thimersol? no    Do you have a past history of Guillan-Barre Syndrome? no    Do you currently have an acute febrile illness? no    Have you ever had a severe reaction to latex? no    Vaccine information given and explained to patient? yes    Are you currently pregnant? no  Orders Added: 1)  Influenza Vaccine MCR [00025] 2)  Administration Flu vaccine - MCR [A9191]     Vital Signs:  Patient profile:   77 year old female Temp:     98.4 degrees F  Vitals Entered By: Marcell Barlow RN (August 09, 2010 11:05 AM)

## 2010-11-29 NOTE — Consult Note (Signed)
Summary: [facility]   Imported By: Raymond Gurney 03/01/2010 16:20:00  _____________________________________________________________________  External Attachment:    Type:   Image     Comment:   External Document

## 2010-12-01 NOTE — Consult Note (Signed)
Summary: [facility]: getting bone scan soon  [facility]   Imported By: Beryle Lathe 11/07/2010 17:11:04  _____________________________________________________________________  External Attachment:    Type:   Image     Comment:   External Document

## 2010-12-07 NOTE — Progress Notes (Signed)
Summary: refill Lorazepam with 4 refills.   Phone Note Refill Request Call back at Home Phone [phone] Message from:  Patient  Refills Requested: Medication #1:  LORAZEPAM 0.5 MG TABS 1 tab by mouth q 8 hrs as needed travel anxiety Initial call taken by: Audie Clear,  November 28, 2010 9:14 AM  Follow-up for Phone Call        refaxed back to pharmacy 11-29-10 Follow-up by: Esmeralda Arthur MD,  November 29, 2010 1:37 PM    New/Updated Medications: LORAZEPAM 0.5 MG TABS (LORAZEPAM) 1 tab by mouth q 8 hrs as needed travel anxiety Prescriptions: LORAZEPAM 0.5 MG TABS (LORAZEPAM) 1 tab by mouth q 8 hrs as needed travel anxiety  #30 x 0   Entered and Authorized by:   Esmeralda Arthur MD   Signed by:   Esmeralda Arthur MD on 11/29/2010   Method used:   Historical   RxID:   [id number]

## 2010-12-15 NOTE — Consult Note (Signed)
Summary: [facility]   Imported By: Audie Clear 12/02/2010 12:05:52  _____________________________________________________________________  External Attachment:    Type:   Image     Comment:   External Document

## 2011-01-08 NOTE — H&P (Signed)
NAME:  Karen Richard, Karen Richard NO.:  [id number]  MEDICAL RECORD NO.:  [id number]          PATIENT TYPE:  OUT  LOCATION:  EKG                          FACILITY:  [facility]  PHYSICIAN:  Olamide Lahaie A. Walker Kehr, M.D.    DATE OF BIRTH:  08-06-1927  DATE OF ADMISSION:  02/01/2010 DATE OF DISCHARGE:  02/01/2010                             HISTORY & PHYSICAL   Vital signs for this patient is weight 157.2 pounds, temperature 97.9, pulse in the 80, blood pressure 153/74.  HISTORY OF PRESENT ILLNESS:  This is an 77 year old female with a history of bilateral breast cancer status post bilateral mastectomies, hyperlipidemia, and hyperglycemia.  The patient had about 9 days of chest tightness.  She has this chest tightness mostly at night.  She describes that as a fullness that she feels in her chest, especially after dinner.  She has some feelings of nausea, but it is relieved by eating.  She denies actual chest pain.  She does not have any shortness of breath.  There is no change with exertion.  She has no radiation of chest tightness or diaphoresis, but says she is always hot.  She has had a cough for the last 9 days.  She has no swelling in her lower extremities.  She has a history of hyperlipidemia and hyperglycemia. She is not currently on meds for either one of these conditions.  She has a history of CLL which is currently untreated.  Her oncologist is Dr. Marko Plume.  She is supposed to have an appointment with her oncologist tomorrow at 11 a.m.  The patient has a history of breast cancer and was treated with mastectomies and tamoxifen, but not radiation or chemo. She is currently treated with Femara.  Her current medications include: 1. Aspirin 81 mg p.o. q.a.m. 2. Lorazepam 0.5 mg p.o. q.8 hours as needed for travel anxiety. 3. Femara 2.5 mg 1 tab p.o. daily. 4. Tylenol PM Extra Strength 500/25 mg at bedtime as needed. 5. Acetaminophen 500 mg p.o. p.r.n. 6. Calcium and vitamin D  combination 1 pill a day.  She has allergy to CODEINE.  PAST MEDICAL HISTORY:  Includes invasive ductal carcinoma status post 5 years of tamoxifen, currently on Femara, CLL stage 0 diagnosed back in March 2005, left breast cancer status post complete mastectomy in December 1999, benign polyps on colonoscopy in 2008, situational anxiety, seborrheic keratosis, restless legs syndrome, osteoarthritis, onychomycosis, hyperlipidemia, DJD, and blood in the stools.  She has a past surgical history significant for an appendectomy in 2001, cholecystectomy and hysterectomy and BSO in 2005, mastectomy in 1999, right TKA in 2005, and a T-score of positive 1.08 in 2003.  FAMILY HISTORY:  Significant for diabetes in her mom and her father, positive hypertension in her sister, and rheumatoid arthritis in her mother.  SOCIAL HISTORY:  She was remarried in 2004.  She currently lives with her husband.  She has 3 sons and 1 daughter.  She lives at [facility].  REVIEW OF SYSTEMS:  Vital signs reviewed and pertinent negatives and positives are seen in the HPI.  PHYSICAL EXAMINATION:  VITAL SIGNS:  Noted as above. GENERAL:  The patient is a well developed, well nourished, in no acute distress.  She is alert and cooperative throughout the exam. HEENT:  Her head is normocephalic, atraumatic without obvious abnormalities with no apparent alopecia or balding. BREASTS:  She has bilateral scars at the site of her breast mastectomies.  She has right breast scar puckering, but no erythema. LUNGS:  Normal respiratory effort with chest expands symmetrically. Lungs are clear to auscultation with no crackles or wheezes. HEART:  Normal rate, regular rhythm.  She has an S1 and S2 that are normal without gallop, murmur, click, rub, or any other extra sounds. EXTREMITIES:  Her pulses are normal in her dorsalis pedis and posterior tibial pulses are equal bilaterally.  Extremities show no clubbing, cyanosis, or  edema.  No deformity.  She has full range of motion of all her joints. SKIN:  Intact without suspicious lesions or rashes. PSYCHIATRIC:  She is slightly tearful and anxious.  ASSESSMENT AND PLAN: 1. Chest discomfort.  The patient has been living with chest     discomfort for the last 9 days.  She does have EKG changes that     showed Q-waves in lead I and aVL.  She also has peaked T-waves in     V2-V4.  She has no prior EKG in the last 5 years to compare with.     We will get cardiac enzymes x3 q.8 hours.  We will also repeat the     ECG in the morning as well as put her on telemonitoring.  Plan to     get a chest x-ray incase the chest tightness is due to some changes     in her lung.  Of note, the patient is concerned about her breast     cancer returning or an infection in her lung at this time.  We will     get CBC to help evaluate for infection. 2. Malaise and fatigue.  The patient has been feeling malaise and has     complaint of shaky hand movement before meals.  I am concerned for     hypoglycemia even though the patient has a history of     hyperglycemia.  Her blood glucose was 113 here in the office today,     but this is not fasting glucose.  I will all check an A1c while in     the hospital with plan to be monitoring her for hypoglycemia.  We     will let the admitting team know.  Plan to get a CMP to evaluate     for other causes of fatigue suggestive of liver or renal function     changes. 3. Vitamin D deficiency.  The patient takes calcium 1200 mg and     vitamin D 1000 international units tablets daily.  She was put on     this by her oncologist.  We will plan to continue this upon     discharge, but not during the hospitalization. 4. Insomnia.  The patient has a history of insomnia.  She currently     takes Tylenol PM, but it is not helping very much.  We will plan to     start trazodone q.h.s. and Ambien p.r.n. that she can use while she     is in the hospital. 5.  Nocturia.  The patient is having increased urination at nighttime     and sometime during the day as well.  She does not have a urinary  tract infection based on her UA here in the office.  She has a     strong family history of diabetes.  I am concerned for diabetes, so     we will check her A1c, may consider starting Ditropan or other     antispasmodic in the future. 6. Anxiety.  The patient states that being into doctor's office and     going to the hospital makes her uncomfortable.  She may have     increased anxiety while in the hospital.  She has home lorazepam     that works well for her, so she is continuing during this in the     hospital. 7. Fluids, electrolytes, nutrition/gastrointestinal.  The plan is that     the patient can have a normal diet unless there is any elevation in     her A1c, at which time, I will switch her over to diabetic diet.     She does not look dehydrated, but says that she is urinating a lot,     so we will gently rehydrate her with 100 mL of IV of normal saline     an hour. 8. Disposition.  We will plan to discharge the patient tomorrow if the     patient rules out for an myocardial infarction.  Plan to set up     with Cardiology in the future if her chest pain continues.  Of     note, her urinalysis was completely normal, negative nitrite,     negative leukocyte esterase.     Esmeralda Arthur, MD   ______________________________ Arty Baumgartner. Walker Kehr, M.D.    AS/MEDQ  D:  02/10/2010  T:  02/11/2010  Job:  [id number]  Electronically Signed by Esmeralda Arthur MD on 01/02/2011 09:02:53 AM Electronically Signed by Candelaria Celeste M.D. on 01/08/2011 08:53:05 PM

## 2011-01-11 ENCOUNTER — Encounter: Payer: Self-pay | Admitting: Family Medicine

## 2011-01-11 ENCOUNTER — Ambulatory Visit (INDEPENDENT_AMBULATORY_CARE_PROVIDER_SITE_OTHER): Payer: Medicare Other | Admitting: Family Medicine

## 2011-01-11 VITALS — BP 148/74 | HR 90 | Temp 97.8°F | Ht 62.0 in | Wt 160.0 lb

## 2011-01-11 DIAGNOSIS — R1011 Right upper quadrant pain: Secondary | ICD-10-CM

## 2011-01-11 DIAGNOSIS — R350 Frequency of micturition: Secondary | ICD-10-CM

## 2011-01-11 DIAGNOSIS — R300 Dysuria: Secondary | ICD-10-CM

## 2011-01-11 LAB — POCT URINALYSIS DIPSTICK
Leukocytes, UA: NEGATIVE
Nitrite, UA: NEGATIVE
Protein, UA: NEGATIVE
Urobilinogen, UA: 0.2
pH, UA: 6.5

## 2011-01-11 NOTE — Assessment & Plan Note (Signed)
Pt has been getting constipation. Advised using some Colace to soften stools and eating lots of foods with fiber. No other signs of RUQ pathology.

## 2011-01-11 NOTE — Patient Instructions (Signed)
Your urinalysis was normal.  Try to request your medication refills 2 weeks in advance so we have time to refill them.  Take colace to keep your stools soft.  Keep drinking plenty of water and start exercising.  Try to exercise 30 min 5 days a week. Do fun things that you like. Try to get outside so you can get some Vit D through the sunlight.  Eat lots of leafy greens to get some calcium.  Take Motrin for back pain. You can take 400 mg every 4-6 hours as needed.

## 2011-01-11 NOTE — Progress Notes (Signed)
Urinary frequency Pt has been having UTI symptoms with urinary frequency with some pressure in her bladder that started about 6 days ago. She has been drinking lots of water, OJ and grape juice. She says the symptoms have gone away over the last few days.   RUQ pain: Pt is having some RUQ pain when she is constipated. She normally has 2-3 BM's a day but has been taking calcium and thinks that is causing constipation. She has recently stopped the calcium and because she got a viral gastroenteritis she has started moving her bowels again. Her viral gastroenteritis kept her up all Sat night with diarrhea and a fever of 100.3 but she had not vomiting. Her stool is back to normal now.   ROS:  Neg except as noted in the HPI.   PE: Gen: Pt is comfortable sitting on the table.  CV: RRR, no murmur Pulm: clear to ausculation bilaterally, no wheezing.  Abd: no masses, no tenderness, no distention, hypoactive BS but they were present. No suprapubic pain.

## 2011-01-11 NOTE — Assessment & Plan Note (Signed)
UA in office was found to be neg. Encouraged continued water drinking.

## 2011-01-12 ENCOUNTER — Ambulatory Visit
Admission: RE | Admit: 2011-01-12 | Discharge: 2011-01-12 | Disposition: A | Discharge status: Home / Self Care | Payer: Medicare Other | Source: Ambulatory Visit | Attending: Oncology | Admitting: Oncology

## 2011-01-12 DIAGNOSIS — Z79899 Other long term (current) drug therapy: Secondary | ICD-10-CM

## 2011-01-18 LAB — COMPREHENSIVE METABOLIC PANEL
ALT: 61 U/L — ABNORMAL HIGH (ref 0–35)
AST: 80 U/L — ABNORMAL HIGH (ref 0–37)
Albumin: 3.9 g/dL (ref 3.5–5.2)
CO2: 28 mEq/L (ref 19–32)
Calcium: 9.3 mg/dL (ref 8.4–10.5)
Creatinine, Ser: 0.62 mg/dL (ref 0.4–1.2)
GFR calc Af Amer: >60 mL/min (ref >60)
Sodium: 139 mEq/L (ref 135–145)

## 2011-01-18 LAB — CBC
MCHC: 33.4 g/dL (ref 30.0–36.0)
MCV: 95.5 fL (ref 78.0–100.0)
Platelets: 118 10*3/uL — ABNORMAL LOW (ref 150–400)
RBC: 4.66 MIL/uL (ref 3.87–5.11)
WBC: 11.1 10*3/uL — ABNORMAL HIGH (ref 4.0–10.5)

## 2011-01-18 LAB — CARDIAC PANEL(CRET KIN+CKTOT+MB+TROPI)
CK, MB: 0.8 ng/mL (ref 0.3–4.0)
Relative Index: INVALID (ref 0.0–2.5)
Relative Index: INVALID (ref 0.0–2.5)
Relative Index: INVALID (ref 0.0–2.5)
Total CK: 51 U/L (ref 7–177)
Total CK: 55 U/L (ref 7–177)
Troponin I: 0.01 ng/mL (ref 0.00–0.06)
Troponin I: 0.02 ng/mL (ref 0.00–0.06)
Troponin I: <0.01 ng/mL (ref 0.00–0.06)

## 2011-01-18 LAB — LIPID PANEL
HDL: 36 mg/dL — ABNORMAL LOW (ref >39)
Total CHOL/HDL Ratio: 5.1 RATIO
VLDL: 25 mg/dL (ref 0–40)

## 2011-02-01 ENCOUNTER — Encounter: Payer: Self-pay | Admitting: Home Health Services

## 2011-02-14 LAB — COMPREHENSIVE METABOLIC PANEL
ALT: 57 U/L — ABNORMAL HIGH (ref 0–35)
AST: 62 U/L — ABNORMAL HIGH (ref 0–37)
Alkaline Phosphatase: 91 U/L (ref 39–117)
CO2: 29 mEq/L (ref 19–32)
Calcium: 9.7 mg/dL (ref 8.4–10.5)
Chloride: 106 mEq/L (ref 96–112)
GFR calc Af Amer: >60 mL/min (ref >60)
GFR calc non Af Amer: >60 mL/min (ref >60)
Potassium: 4.6 mEq/L (ref 3.5–5.1)
Sodium: 140 mEq/L (ref 135–145)

## 2011-02-14 LAB — DIFFERENTIAL
Basophils Absolute: 0.2 10*3/uL — ABNORMAL HIGH (ref 0.0–0.1)
Eosinophils Relative: 2 % (ref 0–5)
Lymphs Abs: 11.1 10*3/uL — ABNORMAL HIGH (ref 0.7–4.0)
Monocytes Absolute: 0.8 10*3/uL (ref 0.1–1.0)
Monocytes Relative: 5 % (ref 3–12)
Neutro Abs: 3.1 10*3/uL (ref 1.7–7.7)

## 2011-02-14 LAB — CBC
Hemoglobin: 15 g/dL (ref 12.0–15.0)
MCHC: 34.5 g/dL (ref 30.0–36.0)
RBC: 4.72 MIL/uL (ref 3.87–5.11)
WBC: 15.5 10*3/uL — ABNORMAL HIGH (ref 4.0–10.5)

## 2011-03-14 NOTE — Op Note (Signed)
Karen Richard, Karen Richard             ACCOUNT NO.:  [id number]   MEDICAL RECORD NO.:  [id number]          PATIENT TYPE:  OIB   LOCATION:  5155                         FACILITY:  [facility]   PHYSICIAN:  Fenton Malling. Lucia Gaskins, M.D.  DATE OF BIRTH:  [birth year]   DATE OF PROCEDURE:  12/07/2008  DATE OF DISCHARGE:                               OPERATIVE REPORT   Second edit  Date of surgery ?   PREOPERATIVE DIAGNOSIS:  Right breast carcinoma at 10 o'clock position.   POSTOPERATIVE DIAGNOSIS:  Right breast carcinoma at 10 o'clock position,  negative sentinel lymph node per Dr. Enid Cutter.   PROCEDURE:  Injection of right breast with methylene blue (2 mL), right  axillary sentinel lymph node biopsy, right simple mastectomy.   SURGEON:  Fenton Malling. Lucia Gaskins, M.D.   ASSISTANT:  None.   ANESTHESIA:  General endotracheal.   ESTIMATED BLOOD LOSS:  100 mL.   DRAINS LEFT IN:  One 74 Blake drain.   INDICATIONS FOR PROCEDURE:  Karen Richard is a 77 year old white female who  is a patient of Dr. Daine Gip of [facility] and a patient of Dr.  Evlyn Clines.  She had a left breast cancer in December 1999 and has  been doing well since that surgery.  She has come up with a new breast  cancer in her right breast which shows an invasive ductal carcinoma.  She is interest in having a mastectomy on the right side.  I discussed  both lumpectomy, which she was not interested in, and reconstruction,  which she was not interested in.  The risk of mastectomy to the right  side include bleeding, infection, nerve injury, and recurrence of the  tumor and I also discussed with her sentinel lymph node biopsy.   OPERATIVE NOTE:  The patient was placed in a supine position and given a  general endotracheal anesthetic.  Her right chest wall was prepped with  Techni-Care substitute and a time-out was held.  I had marked the right  side, identifying the location in the side of the breast.  I had also  injected the  subareolar position with 2 mL of 40% methylene blue.   I then after draping her, marked her skin.  I found a hot area in the  right axilla and I went ahead and made my skin incisions for my  mastectomy, but then cut down towards the axilla and found a hot blue  lymph node.  The counts were 1400 with backgrounds about 10 and lymph  node was bluish tinge.   Dr. Enid Cutter called back and said touch prep was a negative sentinel  lymph node.   I then went ahead and started my breast dissection.  I went cephalad to  about 2 fingerbreadths below the clavicle.  I went medial to the edge of  the sternum.  I went inferior to the mesenteric fascia of the rectus  abdominis muscle and lateral to latissimus dorsi muscle.  I reflected  the breast off the chest wall.  I put a long suture lateral to mark the  orientation of the breast  and sent this as a complete specimen.   I then irrigated the chest wall and tissues.  I then brought a #19  single Blake drain through the inferior stab wound and sewed this in  place with a 2-0 nylon suture.  I then took a piece of skin at the  cranial margin of the dissection and put a suture on the inferior margin  of that as an additional skin.  I then closed the wound with  subcutaneous interrupted 3-0 Vicryl suture and a subcuticular 4-0 Vicryl  suture, painted the wound with a tincture of benzoin and Steri-Strips.   The patient tolerated the procedure well and was transported to recovery  room in good condition.  Sponge and needle counts were correct at the  end of case.      Fenton Malling. Lucia Gaskins, M.D.  Electronically Signed     DHN/MEDQ  D:  12/07/2008  T:  12/07/2008  Job:  [id number]   cc:   Lennis P. Marko Plume, M.D.  [facility] A. Walker Kehr, M.D.

## 2011-03-17 NOTE — H&P (Signed)
Karen Richard, Karen Richard NO.:  [id number]   MEDICAL RECORD NO.:  [id number]          PATIENT TYPE:  INP   LOCATION:  6384                         FACILITY:  [facility]   PHYSICIAN:  Gaynelle Arabian, M.D.    DATE OF BIRTH:  12/31/1926   DATE OF ADMISSION:  08/08/2004  DATE OF DISCHARGE:                                HISTORY & PHYSICAL   CHIEF COMPLAINT:  Left knee pain.   HISTORY OF PRESENT ILLNESS:  The patient is a 77 year old female well known  to Dr. Gaynelle Arabian, having previously undergone a right total knee  arthroplasty earlier in this year in March 2005.  She has done well with her  right knee;  however, has continued to be plagued with problems with her  left knee.  She has known arthritis on that left side and it continues to be  problematic.  Since she did well with her right knee, she would like to have  surgery for the left.  Risks and benefits discussed.  The patient is  subsequently admitted to the hospital.   ALLERGIES:  No known drug allergies.   INTOLERANCES:  CODEINE causes nausea.   CURRENT MEDICATIONS:  Mobic 15 mg 1/2 tablet b.i.d.   PAST MEDICAL HISTORY:  1.  History of cholelithiasis.  2.  Asymptomatic hiatal hernia.  3.  History of breast cancer.  4.  Past history of urinary tract infection.  5.  Chronic leukemia.   PAST SURGICAL HISTORY:  1.  Appendectomy.  2.  Tubal ligation.  3.  Hysterectomy.  4.  Cholecystectomy.  5.  Left mastectomy in December 1999.  6.  Colonoscopy.  7.  Recent right total knee arthroplasty.   SOCIAL HISTORY:  Married, nonsmoker, no alcohol, has four children.   FAMILY HISTORY:  Mother age 66, with a history of diabetes and heart  disease.  Father age 39, with a history of diabetes and heart disease.  Both  parents are deceased.   REVIEW OF SYSTEMS:  GENERAL:  No fevers, chills, or night sweats.  NEUROLOGIC:  No seizures, syncope, or paralysis.  RESPIRATORY:  No shortness  of breath, productive cough,  or hemoptysis.  CARDIOVASCULAR:  No chest pain,  angina, or orthopnea.  GASTROINTESTINAL:  No nausea, vomiting, diarrhea, or  constipation.  GENITOURINARY:  No dysuria, hematuria, discharge.  MUSCULOSKELETAL:  Pertinent to that of the knee found in the history of  present illness.   PHYSICAL EXAMINATION:  VITAL SIGNS:  Pulse 87, respirations 14, blood  pressure 136/68.  GENERAL:  The patient is a 77 year old white female, petite, well-developed,  well-nourished, appears to be in no acute distress.  She is accompanied by  her husband.  HEENT:  Normocephalic, atraumatic.  Pupils equal, round, reactive to light.  Oropharynx clear.  EOM's intact.  NECK:  Supple.  No carotid bruits.  CHEST:  Clear anterior and posterior chest walls.  No wheezes, rhonchi, or  rales.  HEART:  Regular rate and rhythm, no murmurs.  S1 and S2 noted.  ABDOMEN:  Soft, nontender, bowel sounds are present.  RECTAL:  Not done, not pertinent to  present illness.  BREASTS:  Not done, not pertinent to present illness.  GENITALIA:  Not done, not pertinent to present illness.  EXTREMITIES:  Significant to the left lower extremity.  Left knee shows a  varus deformity with range of motion of 5 to 110 degrees, moderate crepitus,  no effusion.  The right knee shows range of motion of 0 to 115, no swelling,  no instability.   IMPRESSION:  1.  Osteoarthritis, left knee.  2.  History of cholelithiasis.  3.  Asymptomatic hiatal hernia.  4.  History of breast cancer.  5.  Chronic leukemia.   PLAN:  The patient will be admitted to [facility] to undergo a  left total knee replacement arthroplasty.  Surgery will be performed by Dr.  Gaynelle Arabian.     [name]   ALP/MEDQ  D:  08/10/2004  T:  08/10/2004  Job:  [id number]   cc:   Harmon Pier, M.D.  [facility].  Family Prac. Resident  [place], [place] [address]  Fax: [phone]   Lennis P. Marko Plume, M.D.  [address] [place] [address]  Fax:  [phone]

## 2011-03-17 NOTE — Discharge Summary (Signed)
NAMEDENAI, CABA NO.:  [id number]   MEDICAL RECORD NO.:  [id number]          PATIENT TYPE:  INP   LOCATION:  5409                         FACILITY:  [facility]   PHYSICIAN:  Gaynelle Arabian, M.D.    DATE OF BIRTH:  October 24, 1927   DATE OF ADMISSION:  08/08/2004  DATE OF DISCHARGE:  08/11/2004                                 DISCHARGE SUMMARY   ADMISSION DIAGNOSES:  1.  Osteoarthritis left knee.  2.  History of cholelithiasis  3.  Asymptomatic hiatal hernia.  4.  History of breast cancer.  5.  Chronic leukemia.   DISCHARGE DIAGNOSES:  1.  Osteoarthritis left knee status post left total knee arthroplasty.  2.  History of cholelithiasis  3.  Asymptomatic hiatal hernia.  4.  History of breast cancer.  5.  Chronic leukemia.  6.  Mild postoperative blood loss anemia.   PROCEDURE:  August 08, 2004 left total knee arthroplasty, surgeon Dr. Gaynelle Arabian, assistant Arlee Muslim P.A.-C., anesthesia general, postop Marcaine  pain pump, minimal blood loss.  Hemovac drain x1.  Tourniquet time 46  minutes at 300 mmHg.   CONSULTATIONS:  None.   BRIEF HISTORY:  Karen Richard is a 77 year old female with end-stage arthritis  of the left knee.  The pain has been intractable and she has undergone a  successful right total knee in March of 2005 and now presents for a left  total knee.   LABORATORY DATA:  Preop CBC, hemoglobin of 13.4, hematocrit of 39.7.  Neutrophils low at 33, lymphs high at 58, mono 6, eos 2, baso 1.  Postop H&H  10.2 and 30.1, last noted H&H 10.3 and 30.4.  PT and PTT preop 12.1 and 28  respectively and INR 0.9.  Serial pro times followed, last noted PT and INR  15.8 and 1.4.  Chem panel on admission minimally elevated, glucose of 138,  remaining chem panel within normal limits.  Serial BMET's are followed.  Electrolytes remained within normal limits. Glucose came down to 118. Preop  UA negative.  Blood group type O negative.   HOSPITAL COURSE:  The  patient admitted to [facility], taken to the  OR, underwent the above stated procedure without complications.  The patient  tolerated the procedure well, later transferred to the recovery room and  then to the orthopedic floor to continue postoperative care.  Vital signs  were followed.  Patient with PCA and p.o. analgesic with pain control  following surgery. The patient did actually have a good night on the first  evening of surgery. She did not have any nausea, the pain was under good  control. Fluids were KVO'd. She started getting up with physical therapy. By  day two, she did well through the night but did have a little headedness  with change in position, did resolve once she was back in bed.  Her pressure  remained a little low through the night but has been okay during the  morning.  The patient felt it was her muscle relaxant.  PCA and IV's were  discontinued.  Continue with her therapy. Her  Marcaine pain pump was removed  on day two.  She did well with physical therapy.  On day two, she did walk  100 and 120 feet.  By day three she was doing well. The light headedness had  resolved. She had been seen on rounds with Dr. Wynelle Link, progressing with  therapy and was discharged home.   PLAN:  1.  The patient discharged home on August 11, 2004.  2.  Discharge diagnosis please see above.  3.  Discharge medications, Darvocet, Phenergan, Robaxin, Coumadin.  4.  Diet as tolerated.  5.  Followup two weeks from surgery.  Call the office for an appointment.  6.  Activity weightbearing as tolerated.  Home health PT, home health      nursing, total knee protocol. May start showering.   DISPOSITION:  Home.   CONDITION ON DISCHARGE:  Improved.     [name]   ALP/MEDQ  D:  09/21/2004  T:  09/22/2004  Job:  [id number]   cc:   Harmon Pier, M.D.  [facility].  Family Prac. Resident  [place], [place] [address]  Fax: [phone]   Lennis P. Marko Plume, M.D.  [address] [address]  Fax: [phone]

## 2011-03-17 NOTE — Consult Note (Signed)
NAME:  Karen Richard, Karen Richard                       ACCOUNT NO.:  [id number]   MEDICAL RECORD NO.:  [id number]                   PATIENT TYPE:  INP   LOCATION:  0467                                 FACILITY:  [facility]   PHYSICIAN:  Lennis P. Marko Plume, M.D.             DATE OF BIRTH:  1927-06-20   DATE OF CONSULTATION:  01/05/2004  DATE OF DISCHARGE:                                   CONSULTATION   HISTORY OF PRESENT ILLNESS:  The patient is a very pleasant 77 year old  white female whom I am seeing today in consultation at the request of Dr.  Wynelle Link, with mild leukocytosis and predominant lymphocytes on her  preoperative blood work for orthopaedic procedure.  She has been followed at  the [facility] since December 1999, when she was diagnosed with  a T1 N0 left breast cancer, that treated with mastectomy and 13 node  axillary node dissection by Dr. Gretta Cool, ER positive, PR negative tumor,  five years of Tamoxifen which she completed and stopped in January 2005.  I  had met Mr. and Mrs. O'Brien once, in October 2004, as she was transferring  her medical oncology followup to myself.  She tells me that she has had no  problems since she has been off the Tamoxifen.  She also had no recent viral  type infections before the preoperative labs were drawn on December 28, 2003.  CBC from December 28, 2003, had a hemoglobin of 13, white count of  10.1, platelets 232, absolute neutrophils 3, and differential showed  absolute lymphocytes at 6.1, atypical lymphs called, and some smudge cells  called.  That slide is not available for my review.  CMET, coags, and  urinalysis were all good preoperatively.  Previous labs from our office on  August 19, 2003, white count of 9.4, hemoglobin 13, absolute neutrophils 3,  platelets 198, LDH was normal then.  In April 2004, white count was 9000,  hemoglobin 12.8, platelets 198.  In March 2003, white count was 7.1,  hemoglobin 12.8, platelets 206.   PAST MEDICAL HISTORY:  1. Remote appendectomy.  2. Cholecystectomy.  3. Hysterectomy with oophorectomy in 1985.   ALLERGIES:  She is intolerant to CODEINE which has caused nausea.   SOCIAL HISTORY:  No history of tobacco.   PHYSICAL EXAMINATION:  GENERAL:  Presently, she is awake, just generally  uncomfortable and in some pain in bed after a rather restless night, is  appropriate and not in acute distress, and her husband is present, very  supportive and also very anxious.  VITAL SIGNS:  Temperature is 99.3, heart rate 105, respirations 20, blood  pressure 144/57.  LUNGS:  Clear anteriorly and laterally.  HEART:  Regular rate and rhythm.  BREASTS:  I did not repeat breast examination.  ABDOMEN:  Soft without clear organomegaly on just gentle examination now.  NECK:  I do not appreciate any adenopathy;  cervical, supraclavicular,  bilateral axillary now.  EXTREMITIES:  She has compression hose on.   LABORATORY DATA:  CBC is pending from today.   I have told Mr. and Mrs. Werner Lean that the present white count is really not  remarkably different than several past CBCs at our office, though the  predominance of lymphocytes may indicate an early and probably asymptomatic  blood problem at this point that certainly does warrant further evaluation.  I imagine that further heme evaluation will be most easily done as an  outpatient after she is improved from the present orthopaedic surgery.  She  does already have a follow up visit scheduled with me in April 2005.   IMPRESSION AND PLAN:  1. Mild leukocytosis with predominant lymphocytes, adequate neutrophils and     good hemoglobin and platelets preoperatively.  This could well be an     early stage chronic lymphocytic leukemia and I doubt it will impact     particularly on her postoperative course.  I will plan to complete     evaluation, most likely as an outpatient.  2. History of T1 N0 left breast carcinoma, post treatment as above  and not     known recurrent, having completed five years of Tamoxifen in January     2005.  3. Postoperative day #1 from right total knee arthroplasty.                                               Lennis P. Marko Plume, M.D.    LPL/MEDQ  D:  01/05/2004  T:  01/05/2004  Job:  [id number]   cc:   Dr. Lorene Dy

## 2011-03-23 ENCOUNTER — Encounter (INDEPENDENT_AMBULATORY_CARE_PROVIDER_SITE_OTHER): Payer: Self-pay | Admitting: Surgery

## 2011-04-11 ENCOUNTER — Other Ambulatory Visit: Payer: Self-pay | Admitting: Oncology

## 2011-04-11 ENCOUNTER — Encounter (HOSPITAL_BASED_OUTPATIENT_CLINIC_OR_DEPARTMENT_OTHER): Payer: Medicare Other | Admitting: Oncology

## 2011-04-11 DIAGNOSIS — M899 Disorder of bone, unspecified: Secondary | ICD-10-CM

## 2011-04-11 DIAGNOSIS — Z853 Personal history of malignant neoplasm of breast: Secondary | ICD-10-CM

## 2011-04-11 DIAGNOSIS — C50919 Malignant neoplasm of unspecified site of unspecified female breast: Secondary | ICD-10-CM

## 2011-04-11 DIAGNOSIS — C9110 Chronic lymphocytic leukemia of B-cell type not having achieved remission: Secondary | ICD-10-CM

## 2011-04-11 LAB — CBC WITH DIFFERENTIAL/PLATELET
Basophils Absolute: 0.1 10*3/uL (ref 0.0–0.1)
EOS%: 1.8 % (ref 0.0–7.0)
HCT: 42.3 % (ref 34.8–46.6)
HGB: 14.4 g/dL (ref 11.6–15.9)
MCH: 32.3 pg (ref 25.1–34.0)
MCV: 94.9 fL (ref 79.5–101.0)
MONO%: 4.3 % (ref 0.0–14.0)
NEUT%: 26.3 % — ABNORMAL LOW (ref 38.4–76.8)

## 2011-04-11 LAB — COMPREHENSIVE METABOLIC PANEL
AST: 56 U/L — ABNORMAL HIGH (ref 0–37)
Alkaline Phosphatase: 104 U/L (ref 39–117)
BUN: 11 mg/dL (ref 6–23)
Creatinine, Ser: 0.57 mg/dL (ref 0.50–1.10)
Total Bilirubin: 0.8 mg/dL (ref 0.3–1.2)

## 2011-05-26 ENCOUNTER — Encounter: Payer: Self-pay | Admitting: Family Medicine

## 2011-05-26 ENCOUNTER — Ambulatory Visit (INDEPENDENT_AMBULATORY_CARE_PROVIDER_SITE_OTHER): Payer: Medicare Other | Admitting: Family Medicine

## 2011-05-26 DIAGNOSIS — G4700 Insomnia, unspecified: Secondary | ICD-10-CM

## 2011-05-26 DIAGNOSIS — C50919 Malignant neoplasm of unspecified site of unspecified female breast: Secondary | ICD-10-CM

## 2011-05-26 NOTE — Patient Instructions (Addendum)
It was very nice to meet you. If you start becoming confused or increasingly drowsy, please hold Ativan dose. Continue to keep eating healthy foods. Please schedule an annual physical at your earliest convenience.

## 2011-06-02 NOTE — Assessment & Plan Note (Signed)
Currently being followed by Dr. Elsie Stain. Taking Letrozole per Dr. Marko Plume. Will continue to monitor progress and follow up recommendations.

## 2011-06-02 NOTE — Assessment & Plan Note (Signed)
Discussed the disadvantages of continuing benzodiazepines in elderly patient. Patient was very adamant about taking medication for bedtime, because nothing else has worked. Patient takes Ativan 0.5 mg once at bedtime and is not interested in changing medications at this time. Will continue to refill Ativan, but advised patient to hold it if she experiences excessive drowsiness in addition to anti-cholinergic adverse effects. Patient agreed.

## 2011-06-02 NOTE — Progress Notes (Signed)
  Subjective:    Patient ID: Karen Richard, female    DOB: [birth year], 77 y.o.   MRN: [id number]  HPI  77 year old female presents to clinic to meet new MD.  Patient doing well and has no complaints.  She spends most of the encounter telling me which medications she takes and which ones she has stopped taking.  She and husband tell me that the only medication they need me to refill is Ativan for insomnia.  Patient has had a mastectomy and sees Dr. Marko Richard for hx breast cancer.  She states that she does not need any more colonoscopies.  Patient has no complaints/concerns at this time.   Review of Systems Denies fever, chills, night sweats, recent illnesses, or hospitalizations.  Denies abdominal pain, change in appetite, change in weight, chest pain, or SOB.    Objective:   Physical Exam  Constitutional: She appears well-developed and well-nourished.  HENT:  Head: Normocephalic and atraumatic.  Cardiovascular: Regular rhythm and intact distal pulses.  Exam reveals no gallop and no friction rub.   No murmur heard. Pulmonary/Chest: Effort normal and breath sounds normal. She has no wheezes. She has no rales. She exhibits no tenderness.  Abdominal: Soft. Bowel sounds are normal.  Skin: Skin is warm and dry.  Psychiatric:       Somewhat anxious appearing          Assessment & Plan:

## 2011-08-15 ENCOUNTER — Encounter: Payer: Self-pay | Admitting: Family Medicine

## 2011-08-15 ENCOUNTER — Ambulatory Visit (INDEPENDENT_AMBULATORY_CARE_PROVIDER_SITE_OTHER): Payer: Medicare Other | Admitting: Family Medicine

## 2011-08-15 VITALS — BP 154/78 | HR 87 | Temp 98.0°F | Ht 62.0 in | Wt 164.0 lb

## 2011-08-15 DIAGNOSIS — Z79899 Other long term (current) drug therapy: Secondary | ICD-10-CM

## 2011-08-15 DIAGNOSIS — Z23 Encounter for immunization: Secondary | ICD-10-CM

## 2011-08-15 DIAGNOSIS — Z0000 Encounter for general adult medical examination without abnormal findings: Secondary | ICD-10-CM

## 2011-08-15 MED ORDER — LORAZEPAM 0.5 MG PO TABS: 0.5000 mg | ORAL_TABLET | Freq: Every day | ORAL | Status: DC

## 2011-08-15 NOTE — Patient Instructions (Signed)
It was nice to see you again. I will send you a letter with your lab results. You may schedule an appointment with me in 6 months. Thanks, Dr. Cori Razor Maryjean Morn

## 2011-08-16 ENCOUNTER — Encounter: Payer: Self-pay | Admitting: Family Medicine

## 2011-08-16 LAB — CBC
HCT: 41.7 % (ref 36.0–46.0)
MCV: 95 fL (ref 78.0–100.0)
Platelets: 128 10*3/uL — ABNORMAL LOW (ref 150–400)
RBC: 4.39 MIL/uL (ref 3.87–5.11)
RDW: 14.1 % (ref 11.5–15.5)
WBC: 10.7 10*3/uL — ABNORMAL HIGH (ref 4.0–10.5)

## 2011-08-16 LAB — COMPREHENSIVE METABOLIC PANEL
AST: 48 U/L — ABNORMAL HIGH (ref 0–37)
Albumin: 3.8 g/dL (ref 3.5–5.2)
Alkaline Phosphatase: 107 U/L (ref 39–117)
BUN: 11 mg/dL (ref 6–23)
Creat: 0.54 mg/dL (ref 0.50–1.10)
Glucose, Bld: 132 mg/dL — ABNORMAL HIGH (ref 70–99)
Potassium: 3.8 mEq/L (ref 3.5–5.3)
Total Bilirubin: 0.8 mg/dL (ref 0.3–1.2)

## 2011-08-16 LAB — VITAMIN B12: Vitamin B-12: 746 pg/mL (ref 211–911)

## 2011-08-16 LAB — TSH: TSH: 2.599 u[IU]/mL (ref 0.350–4.500)

## 2011-08-21 NOTE — Progress Notes (Signed)
  Subjective:     Karen Richard is a 77 y.o. female and is here for a comprehensive physical exam. The patient reports problems - swelling underneath R axillary, fatigue, and frequent urination.Marland Kitchen  History   Social History  . Marital Status: Married    Spouse Name: N/A    Number of Children: none  .     Occupational History  .    Social History Main Topics  . Smoking status: Never Smoker   . Smokeless tobacco: Not on file  . Alcohol Use: No  . Drug Use: No  . Sexually Active: Not Currently          Health Maintenance  Topic Date Due  . Colonoscopy  08/30/1979  . Influenza Vaccine  07/30/2012  . Tetanus/tdap  02/27/2018  . Pneumococcal Polysaccharide Vaccine Age 54 And Over  Completed  . Zostavax  Completed    The following portions of the patient's history were reviewed and updated as appropriate: allergies, current medications, past medical history and problem list.  Review of Systems Pertinent items are noted in HPI.   Objective:    General appearance: alert, cooperative and no distress Head: Normocephalic, without obvious abnormality, atraumatic Eyes: conjunctivae/corneas clear. PERRL, EOM's intact. Fundi benign. Throat: lips, mucosa, and tongue normal; teeth and gums normal Neck: no adenopathy, no JVD and supple, symmetrical, trachea midline Lungs: clear to auscultation bilaterally Heart: regular rate and rhythm, S1, S2 normal, no murmur, click, rub or gallop Abdomen: soft, non-tender; bowel sounds normal; no masses,  no organomegaly Extremities: extremities normal, atraumatic, no cyanosis or edema Skin: Skin color, texture, turgor normal. No rashes or lesions Neurologic: Alert and oriented X 3, normal strength and tone. Normal symmetric reflexes. Normal coordination and gait    Assessment:    Healthy female exam, Fatigue, right breast/axillary swelling.       Plan:   Fatigue: likely multi-factorial etiology.  Will check TSH, B12, CMET, CBC, and A1c  today.  Will send letter with results.  Advised patient to take multi-vitamins and may need B12 shots if level is low.  Advised patient to exercise daily to keep up strength and maintain stamina.  Follow up in 6 months.  Swelling, right axillary: likely swelling due to previous mastectomy.  Advised patient to follow up with Dr. Marko Plume next week and ask surgeon about swelling and if anything can done.  Patient agreed to plan.  Most recent mammogram was negative, but patient states she no longer needs further mammograms s/p bilateral mastectomy.  Follow up in 6 months.   See After Visit Summary for Counseling Recommendations

## 2011-08-22 ENCOUNTER — Other Ambulatory Visit: Payer: Self-pay | Admitting: Oncology

## 2011-08-22 ENCOUNTER — Encounter (HOSPITAL_BASED_OUTPATIENT_CLINIC_OR_DEPARTMENT_OTHER): Payer: Medicare Other | Admitting: Oncology

## 2011-08-22 DIAGNOSIS — M899 Disorder of bone, unspecified: Secondary | ICD-10-CM

## 2011-08-22 DIAGNOSIS — C50919 Malignant neoplasm of unspecified site of unspecified female breast: Secondary | ICD-10-CM

## 2011-08-22 DIAGNOSIS — C9110 Chronic lymphocytic leukemia of B-cell type not having achieved remission: Secondary | ICD-10-CM

## 2011-08-22 DIAGNOSIS — Z79899 Other long term (current) drug therapy: Secondary | ICD-10-CM

## 2011-08-22 DIAGNOSIS — Z853 Personal history of malignant neoplasm of breast: Secondary | ICD-10-CM

## 2011-08-22 LAB — COMPREHENSIVE METABOLIC PANEL
ALT: 39 U/L — ABNORMAL HIGH (ref 0–35)
AST: 56 U/L — ABNORMAL HIGH (ref 0–37)
Albumin: 4 g/dL (ref 3.5–5.2)
Alkaline Phosphatase: 109 U/L (ref 39–117)
BUN: 10 mg/dL (ref 6–23)
Creatinine, Ser: 0.58 mg/dL (ref 0.50–1.10)
Potassium: 4.1 mEq/L (ref 3.5–5.3)

## 2011-08-22 LAB — CBC WITH DIFFERENTIAL/PLATELET
BASO%: 0.8 % (ref 0.0–2.0)
Eosinophils Absolute: 0.2 10*3/uL (ref 0.0–0.5)
HCT: 39.9 % (ref 34.8–46.6)
HGB: 13.8 g/dL (ref 11.6–15.9)
LYMPH%: 69.4 % — ABNORMAL HIGH (ref 14.0–49.7)
MCHC: 34.6 g/dL (ref 31.5–36.0)
MONO#: 0.5 10*3/uL (ref 0.1–0.9)
NEUT#: 2.2 10*3/uL (ref 1.5–6.5)
NEUT%: 22.8 % — ABNORMAL LOW (ref 38.4–76.8)
Platelets: 121 10*3/uL — ABNORMAL LOW (ref 145–400)
WBC: 9.6 10*3/uL (ref 3.9–10.3)
lymph#: 6.7 10*3/uL — ABNORMAL HIGH (ref 0.9–3.3)

## 2011-08-22 LAB — MORPHOLOGY: PLT EST: DECREASED

## 2011-08-24 ENCOUNTER — Encounter (INDEPENDENT_AMBULATORY_CARE_PROVIDER_SITE_OTHER): Payer: Self-pay | Admitting: Surgery

## 2011-08-24 ENCOUNTER — Ambulatory Visit (INDEPENDENT_AMBULATORY_CARE_PROVIDER_SITE_OTHER): Payer: Medicare Other | Admitting: Surgery

## 2011-08-24 VITALS — BP 138/78 | HR 80 | Temp 97.2°F | Resp 16 | Ht 62.0 in | Wt 162.5 lb

## 2011-08-24 DIAGNOSIS — C50919 Malignant neoplasm of unspecified site of unspecified female breast: Secondary | ICD-10-CM

## 2011-08-24 NOTE — Progress Notes (Signed)
ASSESSMENT AND PLAN: 1.  Redundant skin/soft tissue right chest wall.  She has the same issue on the left side, to a lesser extent.  I discussed the options for treatment - using different bras vs. Surgery.  At this time, the patient wants to try different bras to see if she can keep this skin in the right place.  If this does not work, I could excise some of her excess skin.  Surgery carries the risks of infection, bleeding, and possibly not fixing her complaint.  I see no evidence of recurrent cancer or any concern like that.  2.  Left breast cancer  Stage 1, left mastectomy, disease free since 1999.  3.  Right breast cancer  Stage 1, right mastectomy 12/07/2008.  Disease free.  4.  On Femara, tolerating well.  HISTORY OF PRESENT ILLNESS: Chief Complaint  Patient presents with  . Routine Post Op    swelling on right side where mastectomy was performed     Karen Richard is a 77 y.o. (DOB: [birth year])  white female who is a patient of DE LA CRUZ,IVY, MD and comes to me today for fluid at mastectomy site.  She sees Dr. Godfrey Pick from an oncology standpoint.  The patient comes accompanied with her husband. Her complaint is that her right chest wall swells at night. She is convinced that she has fluid laterally under her right mastectomy incision. She says by the end of the day the skin in her right lateral mastectomy skin is over her bra laterally.  To a lesser extent, she has the same thing on the left.  She's noticed no new mass, nodule, or area of concern. She is on Femara and tolerating this well. We discussed some of the changes that are going on at [facility].  Path (side, TNM): T1c, N0, LEFT Surgery: Left MRM   Date: Dec 1999 Size of tumor: 1.6  cm Nodes: 0/13 ER: 100% PR: 0% Ki67: -%  HER2Neu: Neg  Path (side, TNM): T1c, N0,  RIGHT Surgery: RIGHT simple mastectomy, SLNBx   Date: 12/07/2008 Size of tumor: 1.4 cm Nodes: 0/1 ER: 100% PR: 46% Ki67: 8%  HER2Neu:  Neg  Medical Oncologist: [place]  Radiation Oncologist: -  PHYSICAL EXAM: BP 138/78  Pulse 80  Temp(Src) 97.2 F (36.2 C) (Temporal)  Resp 16  Ht 5' 2"  (1.575 m)  Wt 162 lb 8 oz (73.71 kg)  BMI 29.72 kg/m2  HEENT:  Pupils equal.  Dentition good.  No injury. NECK:  Supple.  No thyroid mass. LYMPH NODES:  No cervical, supraclavicular, or axillary adenopathy. BREASTS -  RIGHT:  Absent.  No mass or lesion.  She has redundant skin laterally, towards the axilla, that can be pulled to hang over her lateral bra.   LEFT:  Absent.  No mass or lesion.  Same skin problems as right, but less pronounced. UPPER EXTREMITIES:  No evidence of lymphedema.  DATA REVIEWED: [place] of right chest wall in office.  She has redundant skin and soft tissue.  No evidence of fluid.  I showed both the patient and her husband the [place].

## 2011-10-17 ENCOUNTER — Other Ambulatory Visit: Payer: Self-pay | Admitting: Oncology

## 2011-10-17 DIAGNOSIS — Z853 Personal history of malignant neoplasm of breast: Secondary | ICD-10-CM

## 2012-01-26 ENCOUNTER — Ambulatory Visit
Admission: RE | Admit: 2012-01-26 | Discharge: 2012-01-26 | Disposition: A | Discharge status: Home / Self Care | Payer: Medicare Other | Source: Ambulatory Visit | Attending: Oncology | Admitting: Oncology

## 2012-01-26 DIAGNOSIS — Z79899 Other long term (current) drug therapy: Secondary | ICD-10-CM

## 2012-01-30 ENCOUNTER — Telehealth: Payer: Self-pay | Admitting: Family Medicine

## 2012-01-30 NOTE — Telephone Encounter (Signed)
LVM for patient to call back to schedule appointment

## 2012-01-30 NOTE — Telephone Encounter (Signed)
Thanks for trying, Tonya.  I haven't filled her Ativan since last October 2012.  I'm curious to see who has been filling it for her since I haven't been.

## 2012-01-30 NOTE — Telephone Encounter (Signed)
Karen Richard, Patient has not been here since 07/2011. Does she need to come in before she gets the refill?

## 2012-01-30 NOTE — Telephone Encounter (Signed)
Patient is calling for a refill on Lorazepam to go to [facility] on [address].

## 2012-01-30 NOTE — Telephone Encounter (Signed)
Pt stated that she will not come into office just for a refill. She has an appt with her oncologist on the 22nd of this month. I told her that if she should need anything to please call. Pt understood and agreed.Karen Richard

## 2012-01-30 NOTE — Telephone Encounter (Signed)
Yes, please schedule an appointment with me.  It's about time for a check up anyway.

## 2012-01-31 ENCOUNTER — Other Ambulatory Visit: Payer: Self-pay | Admitting: *Deleted

## 2012-01-31 NOTE — Telephone Encounter (Signed)
Received fax for refill on lorazepam.   [facility]ed [facility]  To check last refill:  Done #30 on 09/15/11 by Dr. Maryjean Morn.  Called pharmacy and requested that they contact Dr. Maryjean Morn - patient's PCP for refill.  Pharmacy stated that patient says that Dr. Marko Plume is now her PCP. Let them know that Dr. Peggye Fothergill is her oncologist.   Note in EMR from 01/30/12 from Dr. Annice Needy office that patient is due for a check-up and was to schedule appt. To see Dr. Maryjean Morn.  But refused.   Pt. Due to see Dr. Edwyna Shell on 02/19/12.  Will let Dr. Edwyna Shell know disposition of lorazepam request..

## 2012-02-01 ENCOUNTER — Encounter: Payer: Self-pay | Admitting: Medical Oncology

## 2012-02-01 NOTE — Progress Notes (Signed)
Received a 2nd request from [facility] for lor[facility]epam 0.29m. JMarjory Liesspoke with [facility] yesterday to let them know Dr. LMarko Plumeis pt's oncologist and not PCP. Dr. LMarko Plumewill address lorazepam with pt at office visit 02/19/12. I faxed request back to [facility] with the above information.

## 2012-02-19 ENCOUNTER — Ambulatory Visit (HOSPITAL_BASED_OUTPATIENT_CLINIC_OR_DEPARTMENT_OTHER): Payer: Medicare Other | Admitting: Oncology

## 2012-02-19 ENCOUNTER — Encounter: Payer: Self-pay | Admitting: Oncology

## 2012-02-19 ENCOUNTER — Telehealth: Payer: Self-pay | Admitting: Oncology

## 2012-02-19 ENCOUNTER — Other Ambulatory Visit (HOSPITAL_BASED_OUTPATIENT_CLINIC_OR_DEPARTMENT_OTHER): Payer: Medicare Other | Admitting: Lab

## 2012-02-19 VITALS — BP 139/65 | HR 79 | Temp 96.9°F | Ht 62.0 in | Wt 161.8 lb

## 2012-02-19 DIAGNOSIS — C9110 Chronic lymphocytic leukemia of B-cell type not having achieved remission: Secondary | ICD-10-CM

## 2012-02-19 DIAGNOSIS — C50919 Malignant neoplasm of unspecified site of unspecified female breast: Secondary | ICD-10-CM

## 2012-02-19 DIAGNOSIS — I972 Postmastectomy lymphedema syndrome: Secondary | ICD-10-CM

## 2012-02-19 DIAGNOSIS — I890 Lymphedema, not elsewhere classified: Secondary | ICD-10-CM

## 2012-02-19 DIAGNOSIS — M899 Disorder of bone, unspecified: Secondary | ICD-10-CM

## 2012-02-19 LAB — MORPHOLOGY: RBC Comments: NORMAL

## 2012-02-19 LAB — COMPREHENSIVE METABOLIC PANEL
ALT: 31 U/L (ref 0–35)
Albumin: 3.9 g/dL (ref 3.5–5.2)
CO2: 26 mEq/L (ref 19–32)
Calcium: 9.7 mg/dL (ref 8.4–10.5)
Chloride: 106 mEq/L (ref 96–112)
Glucose, Bld: 94 mg/dL (ref 70–99)
Potassium: 4.1 mEq/L (ref 3.5–5.3)
Sodium: 141 mEq/L (ref 135–145)
Total Bilirubin: 0.9 mg/dL (ref 0.3–1.2)
Total Protein: 6.3 g/dL (ref 6.0–8.3)

## 2012-02-19 LAB — CBC WITH DIFFERENTIAL/PLATELET
Basophils Absolute: 0.1 10*3/uL (ref 0.0–0.1)
EOS%: 1.5 % (ref 0.0–7.0)
Eosinophils Absolute: 0.2 10*3/uL (ref 0.0–0.5)
HGB: 14.2 g/dL (ref 11.6–15.9)
LYMPH%: 70.5 % — ABNORMAL HIGH (ref 14.0–49.7)
MCH: 32.3 pg (ref 25.1–34.0)
MCV: 95.8 fL (ref 79.5–101.0)
MONO%: 7.2 % (ref 0.0–14.0)
NEUT#: 2.4 10*3/uL (ref 1.5–6.5)
Platelets: 117 10*3/uL — ABNORMAL LOW (ref 145–400)
RDW: 13.5 % (ref 11.2–14.5)

## 2012-02-19 NOTE — Progress Notes (Signed)
OFFICE PROGRESS NOTE Date of Visit 02-19-2012 Physicians: D.Lucia Gaskins, A.Strother  INTERVAL HISTORY:  Patient is seen, together with husband, in scheduled follow up of her history of breast cancer and of her CLL. History is of T1N0 left breast cancer diagnosed Nov 1999, 13 nodes negative, ER positive, PR negative, treated with mastectomy and axillary node dissection. She had T1 sentinel node negative right breast cancer in Feb 2010, ER/PR positive and HER 2 negative, treated with mastectomy and the sentinel node evaluation by Dr.Newman and on Femara since March 2010. The CLL was diagnosed in 2005 and has not required any intervention to date.  Patient has known low bone density. She uses occasional Tums but does not tolerate calcium supplements well; she has begun using a treadmill ~ 20 min daily. Repeat bone density scan done at [facility] 01-26-2012 (medically necessary yearly with this history as she is on chronic high risk aromatase inhibitor) still shows osteopenia in lumbar spine tho a little improved from 2012, and normal bone density in hips. We have reviewed this scan information now, and I have encouraged her to use Tums for extra calcium and to continue the treadmill.  Patient still notices variable puffiness at medial left mastectomy scar, which is uncomfortable with clothing at times. She saw Dr.Newman after last visit here, with no accumulation of fluid to aspirate. She agrees to lymphedema PT evaluation, which we will set up.  Patient's daughter has been ill, with weight loss down to ~ 80 lbs and short term memory loss. Patient has been anxious because of this and has used occasional ativan, which has been helpful. Otherwise she has had no recent infectious illness, no new or different pain, some intentional weight loss with good appetite, no other GI,respiratory, hematologic, neuro, cardiac complaints. Remainder of 10 point Review of Systems negative.    Objective:  Vital signs in  last 24 hours:  BP 139/65  Pulse 79  Temp(Src) 96.9 F (36.1 C) (Oral)  Ht 5' 2"  (1.575 m)  Wt 161 lb 12.8 oz (73.392 kg)  BMI 29.59 kg/m2 Weight is down 3 lbs from last fall. Easily ambulatory, looks comfortable, husband very supportive as always.  HEENT:mucous membranes moist, pharynx normal without lesions. PERRL, not icteric. Neck supple without JVD. LymphaticsCervical, supraclavicular, and axillary nodes normal.No inguinal adenopathy. Resp: clear to auscultation bilaterally and normal percussion bilaterally Cardio: regular rate and rhythm GI: soft, non-tender; bowel sounds normal; no masses,  no organomegaly Extremities: extremities normal, atraumatic, no cyanosis or edema Neuro:no focal deficits noted Left mastectomy scar not remarkable, nothing left axilla, slight increase in size of left forearm compared to right by visualization. Right mastectomy scar well healed, with slight soft tissue prominence medially which may be lymphedema, all soft and nothing that seems to be local recurrence. Right axilla not remarkable, no obvious lymphedema RUE by visualization. Skin with scattered seborrheic keratoses. Lab Results:   Basename 02/19/12 1045  WBC 11.8*  HGB 14.2  HCT 42.0  PLT 117*  ANC 2.4. Variant lymphs and some large plt noted on smear. These counts are essentially stable in past year.  BMET  Basename 02/19/12 1045  NA 141  K 4.1  CL 106  CO2 26  GLUCOSE 94  BUN 12  CREATININE 0.60  CALCIUM 9.7  AST improved at 52 and remainder of full CMET WNL  Studies/Results:  [facility]26[facility]2012 as above. Medications: I have reviewed the patient's current medications. Encouraged regular use of Tums as above  Assessment/Plan:  1.history of bilateral breast carcinoma: most recent Feb 2010, continuing Femara. I will see her back in 6 mo or sooner if needed. Referral to lymphedema PT 2.CLL: stable counts, no bleeding or infections. Follow counts at next  visit 3.osteopenia as above. Medically necessary to get bone density scans yearly for reasons as above.        Dionte Blaustein P, MD   02/19/2012, 4:12 PM

## 2012-02-19 NOTE — Patient Instructions (Signed)
Appointment with lymphedema physical therapist to show you gentle massage to help tissue fluid   It would be good to take TUMS daily for extra calcium. Treadmill is great!

## 2012-02-19 NOTE — Telephone Encounter (Signed)
Gv pt apppt for oct2013.  called April @ [facility] and informed her of referral for appt

## 2012-02-20 ENCOUNTER — Other Ambulatory Visit: Payer: Medicare Other | Admitting: Lab

## 2012-03-05 ENCOUNTER — Other Ambulatory Visit: Payer: Self-pay

## 2012-03-05 DIAGNOSIS — F438 Other reactions to severe stress: Secondary | ICD-10-CM

## 2012-03-05 MED ORDER — LORAZEPAM 0.5 MG PO TABS: 0.5000 mg | ORAL_TABLET | ORAL | Status: DC | PRN

## 2012-03-22 ENCOUNTER — Other Ambulatory Visit: Payer: Self-pay | Admitting: Oncology

## 2012-05-14 ENCOUNTER — Ambulatory Visit (INDEPENDENT_AMBULATORY_CARE_PROVIDER_SITE_OTHER): Payer: Medicare Other | Admitting: Family Medicine

## 2012-05-14 ENCOUNTER — Encounter: Payer: Self-pay | Admitting: Family Medicine

## 2012-05-14 VITALS — BP 149/79 | HR 86 | Temp 98.3°F | Ht 62.0 in | Wt 161.0 lb

## 2012-05-14 DIAGNOSIS — R030 Elevated blood-pressure reading, without diagnosis of hypertension: Secondary | ICD-10-CM

## 2012-05-14 DIAGNOSIS — M546 Pain in thoracic spine: Secondary | ICD-10-CM

## 2012-05-14 MED ORDER — DICLOFENAC SODIUM 1 % TD GEL: 1.0000 "application " | Freq: Four times a day (QID) | TRANSDERMAL | Status: DC

## 2012-05-14 MED ORDER — IBUPROFEN 600 MG PO TABS: 600.0000 mg | ORAL_TABLET | Freq: Three times a day (TID) | ORAL | Status: DC | PRN

## 2012-05-14 NOTE — Assessment & Plan Note (Signed)
BP elevated today and has been elevated at previous visits.  Patient denies any taking BP medications and says she avoids salty foods, but does not exercise. She would like to work on diet and exercise first prior to starting BP medications.  Will repeat BP in 1 month and likely start HCTZ or Lisinopril at next visit.

## 2012-05-14 NOTE — Assessment & Plan Note (Signed)
Likely MSK-related muscle strain from overuse or arthritis.  I do not believe this is related to cough.  No signs of cervical radiculopathy or shoulder/nerve injury.  Will treat with NSAIDS, both Ibuprofen 600 mg PRN and Voltaren gel x 4 weeks.  Patient to return in 1 month if symptoms do not improve.  May need X-ray or referral to PT if pain worsens at next visit.

## 2012-05-14 NOTE — Patient Instructions (Addendum)
It was great to see you again. Please pick up pain medications from pharmacy and use as directed. If pain does not improve after one month, please return to clinic. Schedule annual physical with Dr. Francesco Sor in the Fall.  Muscle Strain A muscle strain, or pulled muscle, occurs when a muscle is over-stretched. A small number of muscle fibers may also be torn. This is especially common in athletes. This happens when a sudden violent force placed on a muscle pushes it past its capacity. Usually, recovery from a pulled muscle takes 1 to 2 weeks. But complete healing will take 5 to 6 weeks. There are millions of muscle fibers. Following injury, your body will usually return to normal quickly. HOME CARE INSTRUCTIONS   While awake, apply ice to the sore muscle for 15 to 20 minutes each hour for the first 2 days. Put ice in a plastic bag and place a towel between the bag of ice and your skin.   Do not use the pulled muscle for several days. Do not use the muscle if you have pain.   You may wrap the injured area with an elastic bandage for comfort. Be careful not to bind it too tightly. This may interfere with blood circulation.   Only take over-the-counter or prescription medicines for pain, discomfort, or fever as directed by your caregiver. Do not use aspirin as this will increase bleeding (bruising) at injury site.   Warming up before exercise helps prevent muscle strains.  SEEK MEDICAL CARE IF:  There is increased pain or swelling in the affected area. MAKE SURE YOU:   Understand these instructions.   Will watch your condition.   Will get help right away if you are not doing well or get worse.  Document Released: 10/16/2005 Document Revised: 10/05/2011 Document Reviewed: 05/15/2007 [facility] Patient Information 2012 [place].

## 2012-05-14 NOTE — Progress Notes (Signed)
  Subjective:    Patient ID: Karen Richard, female    DOB: October 26, 1927, 78 y.o.   MRN: [id number]  HPI  Patient presents to clinic for upper back pain.  Pain is located left of thoracic spine, near scapula.  Patient says pain is intermittent, somewhat relieved by OTC Ibuprofen 400 mg.  She denies any recent injury or trauma to LT shoulder.  Denies any associated numbness or tingling of upper extremities.  Patient says pain is worse in the AM when she gets out of bed and gets better throughout the day.  Pain does not interfere with ADLS.  She also complains of cough which has been going on for several years.  Cough is dry.  Patient concerned because her back pain is painful when she coughs.  Denies any associated fever, chills, phlegm, decreased appetite, SOB, of fatigue.    Review of Systems  Per HPI    Objective:   Physical Exam  Constitutional: She is oriented to person, place, and time. No distress.  Neck: Normal range of motion. Neck supple. No JVD present.  Cardiovascular: Normal rate, regular rhythm and normal heart sounds.   Pulmonary/Chest: Effort normal and breath sounds normal. She has no wheezes. She has no rales.  Musculoskeletal:       LEFT: No tissue texture changes or decreased ROM of thoracic spine.  Tenderness on palpation of paraspinal muscles left of thoracic spine.  Shoulder exam negative for nerve impingement or rotator cuff injury.  Cervical exam also normal.   RIGHT shoulder: normal exam  Lymphadenopathy:    She has no cervical adenopathy.  Neurological: She is alert and oriented to person, place, and time. She exhibits normal muscle tone.          Assessment & Plan:

## 2012-06-11 ENCOUNTER — Ambulatory Visit
Admission: RE | Admit: 2012-06-11 | Discharge: 2012-06-11 | Disposition: A | Discharge status: Home / Self Care | Payer: Medicare Other | Source: Ambulatory Visit | Attending: Family Medicine | Admitting: Family Medicine

## 2012-06-11 ENCOUNTER — Ambulatory Visit (INDEPENDENT_AMBULATORY_CARE_PROVIDER_SITE_OTHER): Payer: Medicare Other | Admitting: Family Medicine

## 2012-06-11 ENCOUNTER — Encounter: Payer: Self-pay | Admitting: Family Medicine

## 2012-06-11 VITALS — BP 132/72 | Temp 98.2°F | Ht 61.6 in | Wt 161.0 lb

## 2012-06-11 DIAGNOSIS — R7309 Other abnormal glucose: Secondary | ICD-10-CM

## 2012-06-11 DIAGNOSIS — M546 Pain in thoracic spine: Secondary | ICD-10-CM

## 2012-06-11 DIAGNOSIS — Z859 Personal history of malignant neoplasm, unspecified: Secondary | ICD-10-CM

## 2012-06-11 LAB — COMPREHENSIVE METABOLIC PANEL
AST: 49 U/L — ABNORMAL HIGH (ref 0–37)
Alkaline Phosphatase: 105 U/L (ref 39–117)
BUN: 12 mg/dL (ref 6–23)
Calcium: 9.3 mg/dL (ref 8.4–10.5)
Chloride: 107 mEq/L (ref 96–112)
Creat: 0.56 mg/dL (ref 0.50–1.10)
Total Bilirubin: 1.2 mg/dL (ref 0.3–1.2)

## 2012-06-11 NOTE — Patient Instructions (Addendum)
It was good to see you today.  I am sorry you are not feeling better. Please go to [facility] for a chest X-ray.  We will call you 24-48 hours after you get this done. Depending on the results, I will decide if you will need a referral to Physical Therapy. For pain, continue to take Motrin as needed and apply heating pads to affected area. Please schedule follow up appointment with me as needed.

## 2012-06-12 ENCOUNTER — Encounter: Payer: Self-pay | Admitting: Family Medicine

## 2012-06-12 LAB — CBC WITH DIFFERENTIAL/PLATELET
Basophils Absolute: 0.1 10*3/uL (ref 0.0–0.1)
Basophils Relative: 1 % (ref 0–1)
Eosinophils Absolute: 0.2 10*3/uL (ref 0.0–0.7)
Eosinophils Relative: 2 % (ref 0–5)
HCT: 42.8 % (ref 36.0–46.0)
MCHC: 33.2 g/dL (ref 30.0–36.0)
MCV: 95.5 fL (ref 78.0–100.0)
Monocytes Absolute: 0.7 10*3/uL (ref 0.1–1.0)
RDW: 14.3 % (ref 11.5–15.5)

## 2012-06-12 NOTE — Progress Notes (Signed)
  Subjective:    Patient ID: Karen Richard, female    DOB: [birth year], 78 y.o.   MRN: [id number]  HPI  Patient returns to clinic for follow up thoracic pain, left side.  Located LT rib cage below scapula.  Pain described as "fullness" and patient states "it feels so full like it will pop."  Pain radiates to LT upper extremity.  She denies any numbness/tingling sensation.  Patient says Voltaren gel did relieve symptoms.  Motrin does help, but patient does not want to keep taking pain medication for this.  Pain is constant but worse at night time, she says it is painful to lay on her LT side and she has had difficulty sleeping.  She denies any associated fever, chills, NS, or recent weight loss, nausea or vomiting.  Complains of weakness of RT upper extremity.  Denies any shoulder pain, neck pain or stiffness.  Of note, patient has a hx of breast cancer status post double mastectomy and CLL.  She is followed by Dr. Marko Plume.  Per Dr. Mariana Kaufman last note, patient may benefit from lymphedema PT.  Patient and husband say that PT was recommended awhile ago when she was having RT sided back pain.  Patient requesting to have lab work done today and to be checked for diabetes.  Review of Systems  Per HPI    Objective:   Physical Exam  Constitutional: No distress.  Neck: Normal range of motion. Neck supple.       No stiffness or bony tenderness of cervical spine  Cardiovascular: Normal rate, regular rhythm and normal heart sounds.   Pulmonary/Chest: Effort normal and breath sounds normal. No respiratory distress. She has no wheezes. She has no rales.  Musculoskeletal:       Shoulder, LT: Inspection reveals no abnormalities, atrophy or asymmetry. ROM is full in all planes. 4/5 strength compared to RT. No signs of impingement. Normal scapular function observed.  Thoracic spine: Tenderness on palpation located LT ribs 8-9. No swelling, erythema, or skin changes.           Assessment &  Plan:

## 2012-06-12 NOTE — Assessment & Plan Note (Addendum)
LT thoracic and upper extremity pain getting worse. Will order CXR to rule out rib fractures vs. Bony mets vs. Acute disease. Will order CBC, CMET, and sed rate. Pain may be secondary to lymphedema, but patient and husband do not recall any referral for lymphedema PT. Will call Dr. Marko Plume for additional recommendations regarding LT side rib/arm pain in patient with hx breast cancer and CLL. Follow up in 4 weeks or sooner as needed.

## 2012-06-14 ENCOUNTER — Telehealth: Payer: Self-pay | Admitting: Family Medicine

## 2012-06-14 NOTE — Telephone Encounter (Signed)
Called patient back and reviewed all results.

## 2012-06-14 NOTE — Telephone Encounter (Signed)
Would like the results of x ray of her spine. Can leave results on VM.Karen Richard [place]

## 2012-07-14 ENCOUNTER — Other Ambulatory Visit: Payer: Self-pay | Admitting: Family Medicine

## 2012-07-30 ENCOUNTER — Ambulatory Visit (INDEPENDENT_AMBULATORY_CARE_PROVIDER_SITE_OTHER): Payer: Medicare Other | Admitting: *Deleted

## 2012-07-30 DIAGNOSIS — Z23 Encounter for immunization: Secondary | ICD-10-CM

## 2012-08-19 ENCOUNTER — Telehealth: Payer: Self-pay | Admitting: Oncology

## 2012-08-19 ENCOUNTER — Encounter: Payer: Self-pay | Admitting: Oncology

## 2012-08-19 ENCOUNTER — Ambulatory Visit (HOSPITAL_BASED_OUTPATIENT_CLINIC_OR_DEPARTMENT_OTHER): Payer: Medicare Other | Admitting: Oncology

## 2012-08-19 ENCOUNTER — Other Ambulatory Visit (HOSPITAL_BASED_OUTPATIENT_CLINIC_OR_DEPARTMENT_OTHER): Payer: Medicare Other | Admitting: Lab

## 2012-08-19 VITALS — BP 153/75 | HR 79 | Temp 97.7°F | Resp 20 | Ht 61.6 in | Wt 165.3 lb

## 2012-08-19 DIAGNOSIS — M810 Age-related osteoporosis without current pathological fracture: Secondary | ICD-10-CM

## 2012-08-19 DIAGNOSIS — C9110 Chronic lymphocytic leukemia of B-cell type not having achieved remission: Secondary | ICD-10-CM

## 2012-08-19 DIAGNOSIS — M949 Disorder of cartilage, unspecified: Secondary | ICD-10-CM

## 2012-08-19 DIAGNOSIS — C50919 Malignant neoplasm of unspecified site of unspecified female breast: Secondary | ICD-10-CM

## 2012-08-19 LAB — CBC WITH DIFFERENTIAL/PLATELET
BASO%: 1 % (ref 0.0–2.0)
Basophils Absolute: 0.1 10*3/uL (ref 0.0–0.1)
Eosinophils Absolute: 0.2 10*3/uL (ref 0.0–0.5)
HCT: 41.5 % (ref 34.8–46.6)
HGB: 14 g/dL (ref 11.6–15.9)
LYMPH%: 68.1 % — ABNORMAL HIGH (ref 14.0–49.7)
MCHC: 33.8 g/dL (ref 31.5–36.0)
MONO#: 0.8 10*3/uL (ref 0.1–0.9)
NEUT%: 21.7 % — ABNORMAL LOW (ref 38.4–76.8)
Platelets: 113 10*3/uL — ABNORMAL LOW (ref 145–400)
WBC: 10.5 10*3/uL — ABNORMAL HIGH (ref 3.9–10.3)

## 2012-08-19 LAB — COMPREHENSIVE METABOLIC PANEL (CC13)
BUN: 10 mg/dL (ref 7.0–26.0)
CO2: 25 mEq/L (ref 22–29)
Calcium: 9.1 mg/dL (ref 8.4–10.4)
Chloride: 107 mEq/L (ref 98–107)
Creatinine: 0.7 mg/dL (ref 0.6–1.1)
Glucose: 130 mg/dl — ABNORMAL HIGH (ref 70–99)
Total Bilirubin: 1 mg/dL (ref 0.20–1.20)

## 2012-08-19 LAB — TECHNOLOGIST REVIEW

## 2012-08-19 NOTE — Telephone Encounter (Signed)
gv pt appt schedule for April 2014 including bone density appt.

## 2012-08-19 NOTE — Patient Instructions (Addendum)
Calcium  1200 mg daily as pills (TUMS fine, or any over the counter calcium with D), in addition to what you get in diet.

## 2012-08-19 NOTE — Progress Notes (Signed)
OFFICE PROGRESS NOTE   08/19/2012   Physicians: D.Lucia Gaskins, A.Strother  INTERVAL HISTORY:  Patient is seen, together with husband, in ongoing follow up of her CLL and history of breast cancer.  History is of T1N0 left breast cancer diagnosed Nov 1999, 13 nodes negative, ER positive, PR negative, treated with mastectomy and axillary node dissection. She had T1 sentinel node negative right breast cancer in Feb 2010, ER/PR positive and HER 2 negative, treated with mastectomy and the sentinel node evaluation by Dr.Newman and on Femara since March 2010. Last bone density scan was March 2013 at [facility], with progressive osteopenia compared with 2011 and 2010. She is still not taking calcium regularly and we have discussed this again today (is able to tolerate TUMS). The CLL was diagnosed in 2005 and has not required any intervention to date, without recurrent infections, bulky adenopathy, or marked cytopenias.  Patient reports that she has generally felt well. She had PE recently, mentioned left chest wall discomfort and had CXR. She did not go to lymphedema PT for that symptom previously. She has had no fever or symptoms of infection. Appetite is good and she has had difficulty managing calories with the dining program at their assisted living. She is not exercising regularly. The chest wall discomfort is not bothering her now.  Remainder of 10 point Review of Systems negative.  Objective:  Vital signs in last 24 hours:  BP 153/75  Pulse 79  Temp 97.7 F (36.5 C) (Oral)  Resp 20  Ht 5' 1.6" (1.565 m)  Wt 165 lb 4.8 oz (74.98 kg)  BMI 30.63 kg/m2 Ambulatory without assistance, respirations not labored, looks comfortable, excellent rapport with husband.    HEENT:PERRLA, sclera clear, anicteric and oropharynx clear, no lesions normal hair pattern, no JVD LymphaticsCervical, supraclavicular, and axillary nodes normal.No inguinal adenopathy Resp: clear to auscultation bilaterally and normal  percussion bilaterally Cardio: regular rate and rhythm GI: soft, non-tender; bowel sounds normal; no masses,  no organomegaly. Protuberant but not distended. Extremities: extremities normal, atraumatic, no cyanosis or edema Neuro:no sensory deficits noted Breast:bilateral mastectomy scars not remarkable, no tenderness across chest wall now, no rash. Nothing in either axilla. No very obvious lymphedema now.   Lab Results:  CBC today with WBC 10.5, ANC 2.3, Hgb 14 and plt 113K, all generally stable. CMET available after visit Na 139, K 4.2, Cl 107, glu 130, BUN 10, creat 0.7, Tbili 1, AP 122, AST 55, AST 35, Tprot 6.3, alb 3.3, ca 9.1  Studies/Results: CHEST - 2 VIEW 06-11-12 Comparison: Chest x-ray of 02/01/2010  Findings: No active infiltrate or effusion is seen. There is some  peribronchial thickening present. Probable scarring anteriorly  within the lingula or right middle lobe on the lateral view appears  stable. The heart is mildly enlarged and stable. The bones are  osteopenic and there are degenerative changes throughout the  thoracic spine.  IMPRESSION:  Stable chronic change. Mild cardiomegaly. No active lung disease.   Medications: I have reviewed the patient's current medications. She has had flu vaccine. She needs to take TUMS daily for at least 1200 mg calcium, this in addition to dietary intake. Written and oral instructions given to patient/husband now.  Assessment/Plan: 1. T1N0 left breast cancer Nov 1999 and T1N0 right breast cancer Feb 2010: on Femara since March 2010 planned at least for 5 years. Post bilateral mastectomies. 2.Osteopenia progressive since she has been on aromatase inhibitors: daily supplemental calcium again stressed, also exercise. Repeat bone density scan April 2014  medically necessary with ongoing high risk medication (aromatase inhibitor Femara) 3.CLL: observation still appropriate. I will see her back in 6 months after bone density scan, or  sooner if needed. 4.flu vaccine done  Patient and husband followed conversation well and are in agreement with plan.     Gordy Levan, MD   08/19/2012, 8:54 PM

## 2012-09-23 ENCOUNTER — Other Ambulatory Visit: Payer: Self-pay | Admitting: Oncology

## 2012-10-08 ENCOUNTER — Other Ambulatory Visit: Payer: Self-pay | Admitting: Oncology

## 2012-10-08 DIAGNOSIS — F438 Other reactions to severe stress: Secondary | ICD-10-CM

## 2013-01-09 ENCOUNTER — Other Ambulatory Visit: Payer: Self-pay | Admitting: Family Medicine

## 2013-01-29 ENCOUNTER — Ambulatory Visit
Admission: RE | Admit: 2013-01-29 | Discharge: 2013-01-29 | Disposition: A | Discharge status: Home / Self Care | Payer: Medicare Other | Source: Ambulatory Visit | Attending: Oncology | Admitting: Oncology

## 2013-01-29 DIAGNOSIS — C50919 Malignant neoplasm of unspecified site of unspecified female breast: Secondary | ICD-10-CM

## 2013-01-29 DIAGNOSIS — M810 Age-related osteoporosis without current pathological fracture: Secondary | ICD-10-CM

## 2013-02-03 ENCOUNTER — Telehealth: Payer: Self-pay | Admitting: Family Medicine

## 2013-02-03 NOTE — Telephone Encounter (Signed)
Hi Karen Richard, would it be possible to reschedule this appointment to [facility] instead?  She is supposed to see me Friday morning, but it would be great if she could come to [facility] on Thursday morning instead.  Please let me know.  Thanks!

## 2013-02-06 ENCOUNTER — Encounter: Payer: Self-pay | Admitting: Family Medicine

## 2013-02-06 ENCOUNTER — Ambulatory Visit (INDEPENDENT_AMBULATORY_CARE_PROVIDER_SITE_OTHER): Payer: Medicare Other | Admitting: Family Medicine

## 2013-02-06 VITALS — BP 135/82 | HR 71 | Ht 61.5 in | Wt 160.2 lb

## 2013-02-06 DIAGNOSIS — R609 Edema, unspecified: Secondary | ICD-10-CM | POA: Insufficient documentation

## 2013-02-06 DIAGNOSIS — R5383 Other fatigue: Secondary | ICD-10-CM

## 2013-02-06 DIAGNOSIS — R600 Localized edema: Secondary | ICD-10-CM | POA: Insufficient documentation

## 2013-02-06 DIAGNOSIS — R5381 Other malaise: Secondary | ICD-10-CM

## 2013-02-06 LAB — LIPID PANEL
Cholesterol: 171 mg/dL (ref 0–200)
HDL: 44 mg/dL (ref >39)
LDL Cholesterol: 106 mg/dL — ABNORMAL HIGH (ref 0–99)
Triglycerides: 105 mg/dL (ref <150)
VLDL: 21 mg/dL (ref 0–40)

## 2013-02-06 LAB — COMPREHENSIVE METABOLIC PANEL
Alkaline Phosphatase: 128 U/L — ABNORMAL HIGH (ref 39–117)
BUN: 13 mg/dL (ref 6–23)
CO2: 27 mEq/L (ref 19–32)
Glucose, Bld: 107 mg/dL — ABNORMAL HIGH (ref 70–99)
Sodium: 140 mEq/L (ref 135–145)
Total Bilirubin: 1.3 mg/dL — ABNORMAL HIGH (ref 0.3–1.2)
Total Protein: 6.5 g/dL (ref 6.0–8.3)

## 2013-02-06 LAB — CBC WITH DIFFERENTIAL/PLATELET
Basophils Relative: 1 % (ref 0–1)
Eosinophils Absolute: 0.2 10*3/uL (ref 0.0–0.7)
Eosinophils Relative: 2 % (ref 0–5)
HCT: 42.8 % (ref 36.0–46.0)
Hemoglobin: 14.6 g/dL (ref 12.0–15.0)
Lymphs Abs: 8.7 10*3/uL — ABNORMAL HIGH (ref 0.7–4.0)
MCH: 32 pg (ref 26.0–34.0)
MCHC: 34.1 g/dL (ref 30.0–36.0)
MCV: 93.9 fL (ref 78.0–100.0)
Monocytes Absolute: 0.8 10*3/uL (ref 0.1–1.0)
Monocytes Relative: 6 % (ref 3–12)
Neutrophils Relative %: 19 % — ABNORMAL LOW (ref 43–77)
RBC: 4.56 MIL/uL (ref 3.87–5.11)

## 2013-02-06 NOTE — Assessment & Plan Note (Signed)
Patient requesting lab work done, it has been over one year.  Will order CBC, Vitamin D, and CMET.

## 2013-02-06 NOTE — Assessment & Plan Note (Signed)
No signs of edema at this time.  Recommended elevating legs over heart.  Patient does not wish to wear compression hose now that summer is coming, but she will let me know if she changes her mind.  BP at goal.  Edema likely secondary to chronic venous stasis.  Follow up with me in 2 months or sooner as needed.

## 2013-02-06 NOTE — Progress Notes (Signed)
  Subjective:    Patient ID: Karen Richard, female    DOB: [birth year], 79 y.o.   MRN: [id number]  HPI  Patient presents with bilateral leg swelling. Started 1.5 weeks ago, but has been getting better. She elevates both legs at night when she sits in a recliner. She does not wear compression hose, but wears tights on Sundays. Patient denies any leg pain, unilateral swelling, or calf pain. Patient denies any associated SOB, chest pain, but she does endorse chronic cough.  Patient would like to have lipid panel and other annual screening labs done today for her records.   Review of Systems Per HPI    Objective:   Physical Exam  Constitutional: No distress.  Cardiovascular: Normal rate.   Pulmonary/Chest: Effort normal and breath sounds normal.  Musculoskeletal:  Trace non-pitting edema bilateral ankles, spider veins located medial ankle, no open wounds or ulcers; negative Homan sign and no calf tenderness       Assessment & Plan:

## 2013-02-06 NOTE — Patient Instructions (Addendum)
It was good to see you today, Karen Richard. I will order blood work and notify of results. Elevate your legs over your heart and please call me if you need compression hose. Schedule follow up appointment with me in June.  Peripheral Edema You have swelling in your legs (peripheral edema). This swelling is due to excess accumulation of salt and water in your body. Edema may be a sign of heart, kidney or liver disease, or a side effect of a medication. It may also be due to problems in the leg veins. Elevating your legs and using special support stockings may be very helpful, if the cause of the swelling is due to poor venous circulation. Avoid long periods of standing, whatever the cause. Treatment of edema depends on identifying the cause. Chips, pretzels, pickles and other salty foods should be avoided. Restricting salt in your diet is almost always needed. Water pills (diuretics) are often used to remove the excess salt and water from your body via urine. These medicines prevent the kidney from reabsorbing sodium. This increases urine flow. Diuretic treatment may also result in lowering of potassium levels in your body. Potassium supplements may be needed if you have to use diuretics daily. Daily weights can help you keep track of your progress in clearing your edema. You should call your caregiver for follow up care as recommended. SEEK IMMEDIATE MEDICAL CARE IF:   You have increased swelling, pain, redness, or heat in your legs.  You develop shortness of breath, especially when lying down.  You develop chest or abdominal pain, weakness, or fainting.  You have a fever. Document Released: 11/23/2004 Document Revised: 01/08/2012 Document Reviewed: 11/03/2009 [facility] Patient Information 2013 [place].

## 2013-02-07 ENCOUNTER — Ambulatory Visit: Payer: Medicare Other | Admitting: Family Medicine

## 2013-02-17 ENCOUNTER — Ambulatory Visit (HOSPITAL_BASED_OUTPATIENT_CLINIC_OR_DEPARTMENT_OTHER): Payer: Medicare Other | Admitting: Oncology

## 2013-02-17 ENCOUNTER — Telehealth: Payer: Self-pay | Admitting: Oncology

## 2013-02-17 ENCOUNTER — Ambulatory Visit (HOSPITAL_BASED_OUTPATIENT_CLINIC_OR_DEPARTMENT_OTHER): Payer: Medicare Other

## 2013-02-17 ENCOUNTER — Encounter: Payer: Self-pay | Admitting: Oncology

## 2013-02-17 VITALS — BP 177/60 | HR 73 | Temp 98.0°F | Resp 20 | Ht 61.5 in | Wt 163.2 lb

## 2013-02-17 DIAGNOSIS — C50919 Malignant neoplasm of unspecified site of unspecified female breast: Secondary | ICD-10-CM

## 2013-02-17 DIAGNOSIS — C9110 Chronic lymphocytic leukemia of B-cell type not having achieved remission: Secondary | ICD-10-CM

## 2013-02-17 DIAGNOSIS — M899 Disorder of bone, unspecified: Secondary | ICD-10-CM

## 2013-02-17 LAB — COMPREHENSIVE METABOLIC PANEL (CC13)
Alkaline Phosphatase: 138 U/L (ref 40–150)
Glucose: 127 mg/dl — ABNORMAL HIGH (ref 70–99)
Sodium: 140 mEq/L (ref 136–145)
Total Bilirubin: 0.97 mg/dL (ref 0.20–1.20)
Total Protein: 6.6 g/dL (ref 6.4–8.3)

## 2013-02-17 LAB — CBC WITH DIFFERENTIAL/PLATELET
Eosinophils Absolute: 0.2 10*3/uL (ref 0.0–0.5)
LYMPH%: 69.2 % — ABNORMAL HIGH (ref 14.0–49.7)
MCHC: 33.8 g/dL (ref 31.5–36.0)
MCV: 95.3 fL (ref 79.5–101.0)
MONO%: 7 % (ref 0.0–14.0)
NEUT#: 2.2 10*3/uL (ref 1.5–6.5)
Platelets: 114 10*3/uL — ABNORMAL LOW (ref 145–400)
RBC: 4.35 10*6/uL (ref 3.70–5.45)

## 2013-02-17 NOTE — Progress Notes (Signed)
OFFICE PROGRESS NOTE   02/17/2013   Physicians:D.Lucia Gaskins, A.Strother   INTERVAL HISTORY:   Patient is seen, together with husband, in continuing attention to her CLL which has not yet needed treatment, and right breast cancer for which she continues letrozole. She has no new or different complaints since she was here last. DEXA scan 02-17-13 compared with 2013 has some decrease in LS now -1.6, and improvement in femoral neck now -0.3.  History is of T1N0 left breast cancer diagnosed Nov 1999, 13 nodes negative, ER positive, PR negative, treated with mastectomy and axillary node dissection. She had T1 sentinel node negative right breast cancer in Feb 2010, ER/PR positive and HER 2 negative, treated with mastectomy and the sentinel node evaluation by Dr.Newman and on Femara since March 2010. She had repeat bone density scan at [facility] 01-29-2013, which shows increase in bone density in left hip and decrease in lumbar spine compared with March 2013. She has been trying to exercise more regularly. The CLL was diagnosed in 2005 and has not required any intervention to date, without recurrent infections, bulky adenopathy, or marked cytopenias.   Patient is for unilateral cataract surgery upcoming.She denies recent fevers or other symptoms of infection. She has had no bleeding or unusual bruising and is not aware of uncomfortable adenopathy. She complains of "hurting all over" at times, unable to give specifics, none now and thinks it may be weather related. No increased SOB, no change in occasional cough, minimal allergic sinus symptoms. Appetite is good and energy at baseline. No change in bowels. Remainder of 10 point Review of Systems negative.  As Dr Annamary Carolin is out on leave, she is interested in changing PCP to Dr Maury Dus, who also sees her husband. I have spoken with that office now, and patient will call to set up appointment. Objective:  Vital signs in last 24 hours:  BP 177/60   Pulse 73  Temp(Src) 98 F (36.7 C) (Oral)  Resp 20  Ht 5' 1.5" (1.562 m)  Wt 163 lb 3.2 oz (74.027 kg)  BMI 30.34 kg/m2  Weight is down 2 lbs. Alert, very pleasant and talkative, husband supportive. Ambulatory without assistance, able to get on and off exam table.  HEENT:PERRLA, sclera clear, anicteric and oropharynx clear, no lesions Lymphatics no increased adenopathy cervical, supraclavicular, axillary, inguinal. Lungs: clear to auscultation bilaterally and normal percussion bilaterally Cardio: regular rate and rhythm GI: obese, soft, nontender, no appreciable HSM, normal bowel sounds Extremities: extremities normal, atraumatic, no cyanosis or edema Neuro: no focal deficits Breast: bilateral mastectomy scars well healed, without evidence of local recurrence Skin with multiple keratoses, no rash or ecchymoses and no petechiae  Lab Results:  Results for orders placed in visit on 02/17/13  CBC WITH DIFFERENTIAL      Result Value Range   WBC 10.3  3.9 - 10.3 10e3/uL   NEUT# 2.2  1.5 - 6.5 10e3/uL   HGB 14.0  11.6 - 15.9 g/dL   HCT 41.4  34.8 - 46.6 %   Platelets 114 (*) 145 - 400 10e3/uL   MCV 95.3  79.5 - 101.0 fL   MCH 32.3  25.1 - 34.0 pg   MCHC 33.8  31.5 - 36.0 g/dL   RBC 4.35  3.70 - 5.45 10e6/uL   RDW 13.8  11.2 - 14.5 %   lymph# 7.1 (*) 0.9 - 3.3 10e3/uL   MONO# 0.7  0.1 - 0.9 10e3/uL   Eosinophils Absolute 0.2  0.0 - 0.5 10e3/uL  Basophils Absolute 0.1  0.0 - 0.1 10e3/uL   NEUT% 21.2 (*) 38.4 - 76.8 %   LYMPH% 69.2 (*) 14.0 - 49.7 %   MONO% 7.0  0.0 - 14.0 %   EOS% 1.6  0.0 - 7.0 %   BASO% 1.0  0.0 - 2.0 %  COMPREHENSIVE METABOLIC PANEL (SJ62)      Result Value Range   Sodium 140  136 - 145 mEq/L   Potassium 4.0  3.5 - 5.1 mEq/L   Chloride 109 (*) 98 - 107 mEq/L   CO2 24  22 - 29 mEq/L   Glucose 127 (*) 70 - 99 mg/dl   BUN 9.1  7.0 - 26.0 mg/dL   Creatinine 0.7  0.6 - 1.1 mg/dL   Total Bilirubin 0.97  0.20 - 1.20 mg/dL   Alkaline Phosphatase 138  40 - 150  U/L   AST 72 (*) 5 - 34 U/L   ALT 46  0 - 55 U/L   Total Protein 6.6  6.4 - 8.3 g/dL   Albumin 3.0 (*) 3.5 - 5.0 g/dL   Calcium 9.1  8.4 - 10.4 mg/dL  LACTATE DEHYDROGENASE (CC13)      Result Value Range   LDH 216  125 - 245 U/L    Labs reviewed as above with patient and husband.  Studies/Results: DUAL X-RAY ABSORPTIOMETRY (DXA) FOR BONE MINERAL 01-29-2013  DENSITY  AP LUMBAR SPINE L1-L4  Bone Mineral Density (BMD): 0.876 g/cm2  Young Adult T Score: -1.6  Z Score: 1.3  LEFT FEMUR NECK  Bone Mineral Density (BMD): 0.815 g/cm2  Young Adult T Score: -0.3  Z Score: 2.1  ASSESSMENT: Patient's diagnostic category is LOW BONE MASS  (OSTEOPENIA) by WHO Criteria.  FRACTURE RISK: INCREASED  FRAX: Based on the [place] model, the 10  year probability of a major osteoporotic fracture is 9.2%. The 10  year probability of a hip fracture is 1.6%.  Comparison: 01/26/2012.  Since the prior study, there has been a significant DECREASE in  bone mineral density of the lumbar spine (-3.0% with a BMD change  of -0.027).  Since the prior study, there has been a significant INCREASE in  bone mineral density of the left hip (+5.1% with a BMD change of  0.050).   Bone density information reviewed with patient and husband now.  Medications: I have reviewed the patient's current medications. She continues letrozole, which I hope to continue for another year. Again she does not have calcium/D listed on EMR medications tho I believe she is taking this and will have RN confirm   Assessment/Plan:  1.1. T1N0 left breast cancer Nov 1999 and T1N0 right breast cancer Feb 2010: on Femara since March 2010 planned at least for 5 years. Post bilateral mastectomies.  2.Osteopenia progressive since she has been on aromatase inhibitors: needs daily supplemental calcium and regular exercise. Repeat bone density yearly is medically necessary with ongoing high risk medication (aromatase inhibitor  Femara)  3.CLL: counts and exam/ history stable, observation still appropriate.   I will see her back in 6 months or sooner if needed. Patient and husband understand that they can call at any time if needed. Time spent 25 min., >50% in discussion      Drury Ardizzone P, MD   02/17/2013, 12:54 PM

## 2013-02-18 ENCOUNTER — Telehealth: Payer: Self-pay | Admitting: *Deleted

## 2013-02-18 NOTE — Telephone Encounter (Signed)
Called pt at home and left message on voice mail re:  Asked pt to call nurse back to confirm that pt is taking calcium supplement  1200 mg daily - as per Dr. Mariana Kaufman request.

## 2013-02-19 ENCOUNTER — Telehealth: Payer: Self-pay

## 2013-02-19 NOTE — Telephone Encounter (Signed)
Karen Richard is taking Tums 750 mg bid.  Put tums on patient's medication list.

## 2013-04-02 ENCOUNTER — Other Ambulatory Visit: Payer: Self-pay | Admitting: Oncology

## 2013-04-24 ENCOUNTER — Telehealth: Payer: Self-pay | Admitting: Hematology & Oncology

## 2013-04-24 ENCOUNTER — Other Ambulatory Visit: Payer: Self-pay | Admitting: Hematology & Oncology

## 2013-04-24 NOTE — Telephone Encounter (Signed)
Pt called made 10-14 appointment. Dr. Marin Olp checked her fermara and she is aware she has 5 refills remaining. She will call if she needs anything before oct appointment

## 2013-08-12 ENCOUNTER — Other Ambulatory Visit (HOSPITAL_BASED_OUTPATIENT_CLINIC_OR_DEPARTMENT_OTHER): Payer: Medicare Other | Admitting: Lab

## 2013-08-12 ENCOUNTER — Ambulatory Visit (HOSPITAL_BASED_OUTPATIENT_CLINIC_OR_DEPARTMENT_OTHER): Payer: Medicare Other | Admitting: Hematology & Oncology

## 2013-08-12 VITALS — BP 152/52 | HR 82 | Temp 97.9°F | Resp 14 | Ht 60.0 in | Wt 161.0 lb

## 2013-08-12 DIAGNOSIS — E559 Vitamin D deficiency, unspecified: Secondary | ICD-10-CM

## 2013-08-12 DIAGNOSIS — C9110 Chronic lymphocytic leukemia of B-cell type not having achieved remission: Secondary | ICD-10-CM

## 2013-08-12 DIAGNOSIS — D0590 Unspecified type of carcinoma in situ of unspecified breast: Secondary | ICD-10-CM

## 2013-08-12 DIAGNOSIS — Z853 Personal history of malignant neoplasm of breast: Secondary | ICD-10-CM

## 2013-08-12 DIAGNOSIS — C50919 Malignant neoplasm of unspecified site of unspecified female breast: Secondary | ICD-10-CM

## 2013-08-12 LAB — COMPREHENSIVE METABOLIC PANEL
Albumin: 3.4 g/dL — ABNORMAL LOW (ref 3.5–5.2)
CO2: 30 mEq/L (ref 19–32)
Calcium: 8.9 mg/dL (ref 8.4–10.5)
Glucose, Bld: 109 mg/dL — ABNORMAL HIGH (ref 70–99)
Potassium: 4.4 mEq/L (ref 3.5–5.3)
Sodium: 138 mEq/L (ref 135–145)
Total Bilirubin: 1.3 mg/dL — ABNORMAL HIGH (ref 0.3–1.2)
Total Protein: 6.3 g/dL (ref 6.0–8.3)

## 2013-08-12 LAB — CBC WITH DIFFERENTIAL (CANCER CENTER ONLY)
HCT: 40.7 % (ref 34.8–46.6)
MCH: 32.9 pg (ref 26.0–34.0)
MCHC: 34.2 g/dL (ref 32.0–36.0)
Platelets: 121 10*3/uL — ABNORMAL LOW (ref 145–400)
RDW: 13.4 % (ref 11.1–15.7)

## 2013-08-12 LAB — MANUAL DIFFERENTIAL (CHCC SATELLITE)
ANC (CHCC HP manual diff): 4.3 10*3/uL (ref 1.5–6.7)
BASO: 3 % — ABNORMAL HIGH (ref 0–2)
Eos: 2 % (ref 0–7)
LYMPH: 50 % — ABNORMAL HIGH (ref 14–48)
MONO: 9 % (ref 0–13)

## 2013-08-12 NOTE — Progress Notes (Signed)
This office note has been dictated.

## 2013-08-13 NOTE — Progress Notes (Signed)
DIAGNOSES: 1. Stage A CLL. 2. 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 3. History of stage I (T1 N0 M0) ductal carcinoma of the left breast,     1999.  CURRENT THERAPY:  Femara 2.5 mg p.o. daily; however, the patient is to finish up in April of 2015.  INTERIM HISTORY:  [name] came in for her first office visit.  She was seen at the main cancer center by Dr. __________.  [name] has a history of bilateral breast cancer.  First breast cancer was diagnosed back in 1999.  This was ER positive.  She had 13 lymph nodes that were negative.  She had a mastectomy and axillary node dissection.  The patient then had a ductal carcinoma of the right breast.  This was diagnosed in February 2010.  This was a stage I.  It was ER/PR positive and HER-2 negative.  She, again, underwent mastectomy.  The patient has been on Femara.  She has been doing well with the Femara.  She was diagnosed with CLL back in 2005.  She has been followed for this. States she feels well.  She has had no complaints.  There is no nausea or vomiting.  There is no headache.  She has had no problems with recurrent infections.  She has had no palpable lymph glands.  There has been no change in bowel or bladder habits.  She is going to be having some cataract surgery for the left eye in December.  She has bad knees.  She has kind of put off the surgery for these.  She has had no rashes.  She has had no mouth sores.  There has been no cough or shortness of breath.  Since she was last seen in April, there has been no change in her medications. She cannot take vitamin D, as she says this causes some stomach upset.  PHYSICAL EXAMINATION:  General:  This is a fairly well developed, well nourished white female in no obvious distress.  Vital signs: Temperature 97.9, pulse 82, respiratory rate 14, blood pressure 152/52. Weight is 161 pounds.  Head and Neck:  Normocephalic, atraumatic skull. There are no  ocular or oral lesions.  There are no palpable cervical or supraclavicular lymph nodes.  Lungs:  Clear bilaterally.  Cardiac: Regular rate and rhythm with a normal S1, S2.  There are no murmurs, rubs or bruits.  Breasts:  Bilateral breast exam shows bilateral mastectomies.  She has  well-healed mastectomies.  There is no nodularity or erythema on the chest wall.  There is no bilateral axillary adenopathy.  Abdomen:  Soft.  She has good bowel sounds.  There is no fluid wave.  There is no palpable abdominal mass.  There is no palpable hepatosplenomegaly.  Back:  No tenderness over the spine, ribs or hips.  Extremities:  Show no clubbing, cyanosis or edema.  She does have some slight swelling in her knees which appeared to be chronic. She has good range motion of all the joints.  She has some crepitus with range of motion of the knees.  Skin:  Shows numerous seborrheic keratoses.  Neurological:  No focal neurological deficits.  LABORATORY STUDIES:  White cell count 12, hemoglobin 14, hematocrit 41, platelet count 121.  Peripheral smear which I reviewed did show an increase in lymphocytes. These appear to be mature.  She had no rouleaux formation.  There are no schistocytes or spherocytes.  I saw no immature myeloid cells. Platelets are  slightly decreased in number, but stable.  IMPRESSION:  [name]. She has history of bilateral breast cancer.  Her most recent one was back in February 2010.  She is doing well from this.  She will finish up her Femara in April of 2015.  The chronic lymphocytic leukemia is not a problem right now.  Her platelet count is low, but stable.  We will plan to get her back in six months' time.  I reviewed the lab work with her.  I went over my recommendations with her.  I do not think we need to make any medicine changes.  I spent a good 45 minutes or so with her and her  husband.    ______________________________ Volanda Napoleon, M.D. PRE/MEDQ  D:  08/12/2013  T:  08/13/2013  Job:  [id number]

## 2013-08-19 ENCOUNTER — Other Ambulatory Visit: Payer: Medicare Other | Admitting: Lab

## 2013-08-19 ENCOUNTER — Ambulatory Visit: Payer: Medicare Other | Admitting: Oncology

## 2013-08-26 ENCOUNTER — Telehealth: Payer: Self-pay | Admitting: Hematology & Oncology

## 2013-08-26 NOTE — Telephone Encounter (Signed)
Pt's husband Juanda Crumble) came by to sign AUTHORIZATION FOR RELEASE/REQUEST OF MEDICAL INFORMATION, on pt's behalf, to have them faxed to:   [place]. Maury Dus MD P: [phone] F: [place]

## 2013-09-28 ENCOUNTER — Other Ambulatory Visit: Payer: Self-pay | Admitting: Oncology

## 2013-09-28 DIAGNOSIS — C50911 Malignant neoplasm of unspecified site of right female breast: Secondary | ICD-10-CM

## 2013-09-30 ENCOUNTER — Other Ambulatory Visit: Payer: Self-pay | Admitting: Hematology & Oncology

## 2014-01-28 ENCOUNTER — Other Ambulatory Visit: Payer: Self-pay | Admitting: Hematology & Oncology

## 2014-02-10 ENCOUNTER — Encounter: Payer: Self-pay | Admitting: Hematology & Oncology

## 2014-02-10 ENCOUNTER — Ambulatory Visit (HOSPITAL_BASED_OUTPATIENT_CLINIC_OR_DEPARTMENT_OTHER): Payer: Medicare Other | Admitting: Hematology & Oncology

## 2014-02-10 ENCOUNTER — Other Ambulatory Visit (HOSPITAL_BASED_OUTPATIENT_CLINIC_OR_DEPARTMENT_OTHER): Payer: Medicare Other | Admitting: Lab

## 2014-02-10 VITALS — BP 153/55 | HR 76 | Temp 98.1°F | Resp 14 | Ht 61.0 in | Wt 160.0 lb

## 2014-02-10 DIAGNOSIS — C9110 Chronic lymphocytic leukemia of B-cell type not having achieved remission: Secondary | ICD-10-CM

## 2014-02-10 DIAGNOSIS — Z853 Personal history of malignant neoplasm of breast: Secondary | ICD-10-CM

## 2014-02-10 DIAGNOSIS — D696 Thrombocytopenia, unspecified: Secondary | ICD-10-CM

## 2014-02-10 DIAGNOSIS — C50919 Malignant neoplasm of unspecified site of unspecified female breast: Secondary | ICD-10-CM

## 2014-02-10 DIAGNOSIS — E559 Vitamin D deficiency, unspecified: Secondary | ICD-10-CM

## 2014-02-10 LAB — CMP (CANCER CENTER ONLY)
ALBUMIN: 2.8 g/dL — AB (ref 3.3–5.5)
ALT: 38 U/L (ref 10–47)
AST: 68 U/L — AB (ref 11–38)
Alkaline Phosphatase: 145 U/L — ABNORMAL HIGH (ref 26–84)
BUN, Bld: 9 mg/dL (ref 7–22)
CALCIUM: 8.7 mg/dL (ref 8.0–10.3)
CHLORIDE: 104 meq/L (ref 98–108)
CO2: 30 meq/L (ref 18–33)
CREATININE: 0.5 mg/dL — AB (ref 0.6–1.2)
Glucose, Bld: 135 mg/dL — ABNORMAL HIGH (ref 73–118)
POTASSIUM: 3.9 meq/L (ref 3.3–4.7)
Sodium: 137 mEq/L (ref 128–145)
TOTAL PROTEIN: 6.5 g/dL (ref 6.4–8.1)
Total Bilirubin: 1.2 mg/dl (ref 0.20–1.60)

## 2014-02-10 LAB — CBC WITH DIFFERENTIAL (CANCER CENTER ONLY)
BASO#: 0.2 10*3/uL (ref 0.0–0.2)
BASO%: 2.1 % — ABNORMAL HIGH (ref 0.0–2.0)
EOS ABS: 0.2 10*3/uL (ref 0.0–0.5)
EOS%: 2 % (ref 0.0–7.0)
HCT: 40.4 % (ref 34.8–46.6)
HEMOGLOBIN: 14 g/dL (ref 11.6–15.9)
LYMPH#: 7.9 10*3/uL — AB (ref 0.9–3.3)
LYMPH%: 69.8 % — ABNORMAL HIGH (ref 14.0–48.0)
MCH: 33.2 pg (ref 26.0–34.0)
MCHC: 34.7 g/dL (ref 32.0–36.0)
MCV: 96 fL (ref 81–101)
MONO#: 0.7 10*3/uL (ref 0.1–0.9)
MONO%: 6 % (ref 0.0–13.0)
NEUT%: 20.1 % — ABNORMAL LOW (ref 39.6–80.0)
NEUTROS ABS: 2.3 10*3/uL (ref 1.5–6.5)
PLATELETS: 110 10*3/uL — AB (ref 145–400)
RBC: 4.22 10*6/uL (ref 3.70–5.32)
RDW: 13.7 % (ref 11.1–15.7)
WBC: 11.3 10*3/uL — AB (ref 3.9–10.0)

## 2014-02-10 LAB — CHCC SATELLITE - SMEAR

## 2014-02-10 LAB — TECHNOLOGIST REVIEW CHCC SATELLITE

## 2014-02-10 NOTE — Progress Notes (Signed)
Hematology and Oncology Follow Up Visit  Karen Richard [id number] 04/23/1927 80 y.o. 02/10/2014   Principle Diagnosis:  Stage A CLL. 2. 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 3. History of stage I (T1 N0 M0) ductal carcinoma of the left breast,     1999.  Current Therapy:   Femara 2.5 mg p.o. Daily - to finish this today.     Interim History:  [name] is back for followup. Restart back in October. She will finish up her Femara today. She's been on for 5 years.  She did have cataract surgery done. This was done on him back in November. This worked well. She had no palpitations from this.  She's had no problems with cough. There's been no shortness of breath. She's had no nausea vomiting. There's been no change in bowel or bladder habits. She's had no leg swelling. She's had no fever sweats or chills.  There's been no change in her medications. Medications: Current outpatient prescriptions:Calcium Carb-Cholecalciferol (CALCIUM 1000 + D PO), Take by mouth every morning., Disp: , Rfl: ;  Cholecalciferol (VITAMIN D-3) 1000 UNITS CAPS, Take by mouth every morning., Disp: , Rfl: ;  aspirin (ASPIRIN EC) 81 MG EC tablet, Take 81 mg by mouth every evening. , Disp: , Rfl: ;  ibuprofen (ADVIL,MOTRIN) 600 MG tablet, TAKE 1 TABLET BY MOUTH EVERY 8 HOURS AS NEEDED FOR PAIN, Disp: 30 tablet, Rfl: 0 letrozole (FEMARA) 2.5 MG tablet, TAKE 1 TABLET BY MOUTH ONCE DAILY, Disp: 30 tablet, Rfl: 4;  LORazepam (ATIVAN) 0.5 MG tablet, TAKE 1 TABLET BY MOUTH EVERY DAY AS NEEDED FOR ANXIETY, Disp: 30 tablet, Rfl: 0  Allergies:  Allergies  Allergen Reactions  . Codeine Nausea Only  . Darvocet [Propoxyphene N-Acetaminophen] Nausea Only    Past Medical History, Surgical history, Social history, and Family History were reviewed and updated.  Review of Systems: As above  Physical Exam:  height is 5' 1"  (1.549 m) and weight is 160 lb (72.576 kg). Her oral temperature is 98.1 F (36.7  C). Her blood pressure is 153/55 and her pulse is 76. Her respiration is 14.   Elderly white female in no obvious distress. Head and neck exam is Dr. oral lesions. There is no adenopathy in the neck. Lungs are clear. Cardiac exam regular rate and rhythm with no murmurs rubs or bruits. Physical exam shows bilateral mastectomies. No chest wall nodules are noted. There is no chest wall erythema. There is no axillary adenopathy bilaterally. Abdomen is soft. Has good bowel sounds. There is no fluid wave. There is no palpable liver or spleen tip. Neck exam shows some slight kyphosis. Extremities shows some trace edema in her lower legs. She is age related osteoarthritic changes. Skin exam shows numerous seborrheic keratoses. Neurological exam is nonfocal.  Lab Results  Component Value Date   WBC 11.3* 02/10/2014   HGB 14.0 02/10/2014   HCT 40.4 02/10/2014   MCV 96 02/10/2014   PLT 110* 02/10/2014     Chemistry      Component Value Date/Time   NA 137 02/10/2014 0952   NA 138 08/12/2013 0924   NA 140 02/17/2013 1037   K 3.9 02/10/2014 0952   K 4.4 08/12/2013 0924   K 4.0 02/17/2013 1037   CL 104 02/10/2014 0952   CL 105 08/12/2013 0924   CL 109* 02/17/2013 1037   CO2 30 02/10/2014 0952   CO2 30 08/12/2013 0924   CO2 24 02/17/2013 1037  BUN 9 02/10/2014 0952   BUN 11 08/12/2013 0924   BUN 9.1 02/17/2013 1037   CREATININE 0.5* 02/10/2014 0952   CREATININE 0.58 08/12/2013 0924   CREATININE 0.7 02/17/2013 1037      Component Value Date/Time   CALCIUM 8.7 02/10/2014 0952   CALCIUM 8.9 08/12/2013 0924   CALCIUM 9.1 02/17/2013 1037   ALKPHOS 145* 02/10/2014 0952   ALKPHOS 130* 08/12/2013 0924   ALKPHOS 138 02/17/2013 1037   AST 68* 02/10/2014 0952   AST 63* 08/12/2013 0924   AST 72* 02/17/2013 1037   ALT 38 02/10/2014 0952   ALT 35 08/12/2013 0924   ALT 46 02/17/2013 1037   BILITOT 1.20 02/10/2014 0952   BILITOT 1.3* 08/12/2013 0924   BILITOT 0.97 02/17/2013 1037         Impression and Plan: [name] is a 34-year-old white female. She has a history of bilateral breast cancer. Her first breast cancer was on the left breast in 1999. This was stage I. Her second one was a stage I of the right breast. This was diagnosed in 2010. This one was ER positive/HER-2 negative. She has been on Femara. She will complete 5 years of Femara now.  As far as her CLL is concerned, I do not see any problems with this. However, I do see that she is a little more thrombocytopenic.  I looked at her blood smear and did not see anything that looked unusual. She did have an increase in lymphocytes. These appear to be mature.  I will plan to see her back in 6 months.  She knows to call us if has any problems before then with bleeding.  I feel we can just follow this along.   Volanda Napoleon, MD 4/14/201511:01 AM

## 2014-02-11 LAB — VITAMIN D 25 HYDROXY (VIT D DEFICIENCY, FRACTURES): Vit D, 25-Hydroxy: 38 ng/mL (ref 30–89)

## 2014-04-09 ENCOUNTER — Ambulatory Visit
Admission: RE | Admit: 2014-04-09 | Discharge: 2014-04-09 | Disposition: A | Discharge status: Home / Self Care | Payer: Medicare Other | Source: Ambulatory Visit | Attending: Family Medicine | Admitting: Family Medicine

## 2014-04-09 ENCOUNTER — Other Ambulatory Visit: Payer: Self-pay | Admitting: Family Medicine

## 2014-04-09 DIAGNOSIS — R609 Edema, unspecified: Secondary | ICD-10-CM

## 2014-05-05 ENCOUNTER — Other Ambulatory Visit (HOSPITAL_COMMUNITY): Payer: Self-pay | Admitting: Family Medicine

## 2014-05-05 ENCOUNTER — Ambulatory Visit (HOSPITAL_COMMUNITY): Payer: Medicare Other | Attending: Cardiology | Admitting: Radiology

## 2014-05-05 DIAGNOSIS — I379 Nonrheumatic pulmonary valve disorder, unspecified: Secondary | ICD-10-CM | POA: Insufficient documentation

## 2014-05-05 DIAGNOSIS — R609 Edema, unspecified: Secondary | ICD-10-CM

## 2014-05-05 DIAGNOSIS — I079 Rheumatic tricuspid valve disease, unspecified: Secondary | ICD-10-CM | POA: Insufficient documentation

## 2014-05-05 DIAGNOSIS — Z853 Personal history of malignant neoplasm of breast: Secondary | ICD-10-CM | POA: Insufficient documentation

## 2014-05-05 DIAGNOSIS — I059 Rheumatic mitral valve disease, unspecified: Secondary | ICD-10-CM | POA: Insufficient documentation

## 2014-05-05 NOTE — Progress Notes (Signed)
Echocardiogram performed.  

## 2014-08-10 ENCOUNTER — Ambulatory Visit (HOSPITAL_BASED_OUTPATIENT_CLINIC_OR_DEPARTMENT_OTHER): Payer: Medicare Other | Admitting: Hematology & Oncology

## 2014-08-10 ENCOUNTER — Encounter: Payer: Self-pay | Admitting: Hematology & Oncology

## 2014-08-10 ENCOUNTER — Other Ambulatory Visit (HOSPITAL_BASED_OUTPATIENT_CLINIC_OR_DEPARTMENT_OTHER): Payer: Medicare Other | Admitting: Lab

## 2014-08-10 VITALS — BP 168/62 | HR 69 | Temp 98.3°F | Resp 16 | Ht 61.0 in | Wt 167.0 lb

## 2014-08-10 DIAGNOSIS — R945 Abnormal results of liver function studies: Secondary | ICD-10-CM

## 2014-08-10 DIAGNOSIS — C50912 Malignant neoplasm of unspecified site of left female breast: Secondary | ICD-10-CM

## 2014-08-10 DIAGNOSIS — C9110 Chronic lymphocytic leukemia of B-cell type not having achieved remission: Secondary | ICD-10-CM

## 2014-08-10 DIAGNOSIS — R7989 Other specified abnormal findings of blood chemistry: Secondary | ICD-10-CM

## 2014-08-10 DIAGNOSIS — C50919 Malignant neoplasm of unspecified site of unspecified female breast: Secondary | ICD-10-CM

## 2014-08-10 LAB — CMP (CANCER CENTER ONLY)
ALBUMIN: 2.9 g/dL — AB (ref 3.3–5.5)
ALT(SGPT): 39 U/L (ref 10–47)
AST: 69 U/L — AB (ref 11–38)
Alkaline Phosphatase: 145 U/L — ABNORMAL HIGH (ref 26–84)
BUN: 8 mg/dL (ref 7–22)
CHLORIDE: 106 meq/L (ref 98–108)
CO2: 26 mEq/L (ref 18–33)
Calcium: 8.8 mg/dL (ref 8.0–10.3)
Creat: 0.4 mg/dl — ABNORMAL LOW (ref 0.6–1.2)
Glucose, Bld: 96 mg/dL (ref 73–118)
Potassium: 3.7 mEq/L (ref 3.3–4.7)
Sodium: 139 mEq/L (ref 128–145)
TOTAL PROTEIN: 6.2 g/dL — AB (ref 6.4–8.1)
Total Bilirubin: 1.4 mg/dl (ref 0.20–1.60)

## 2014-08-10 LAB — CBC WITH DIFFERENTIAL (CANCER CENTER ONLY)
BASO#: 0.4 10*3/uL — ABNORMAL HIGH (ref 0.0–0.2)
BASO%: 4.1 % — ABNORMAL HIGH (ref 0.0–2.0)
EOS%: 2.2 % (ref 0.0–7.0)
Eosinophils Absolute: 0.2 10*3/uL (ref 0.0–0.5)
HEMATOCRIT: 38.9 % (ref 34.8–46.6)
HGB: 13.5 g/dL (ref 11.6–15.9)
LYMPH#: 7 10*3/uL — ABNORMAL HIGH (ref 0.9–3.3)
LYMPH%: 64.8 % — AB (ref 14.0–48.0)
MCH: 33.8 pg (ref 26.0–34.0)
MCHC: 34.7 g/dL (ref 32.0–36.0)
MCV: 97 fL (ref 81–101)
MONO#: 1.1 10*3/uL — ABNORMAL HIGH (ref 0.1–0.9)
MONO%: 9.7 % (ref 0.0–13.0)
NEUT#: 2.1 10*3/uL (ref 1.5–6.5)
NEUT%: 19.2 % — ABNORMAL LOW (ref 39.6–80.0)
Platelets: 111 10*3/uL — ABNORMAL LOW (ref 145–400)
RBC: 4 10*6/uL (ref 3.70–5.32)
RDW: 14.1 % (ref 11.1–15.7)
WBC: 10.8 10*3/uL — AB (ref 3.9–10.0)

## 2014-08-10 LAB — CHCC SATELLITE - SMEAR

## 2014-08-10 LAB — TECHNOLOGIST REVIEW CHCC SATELLITE

## 2014-08-10 NOTE — Progress Notes (Signed)
Hematology and Oncology Follow Up Visit  Karen Richard [id number] 04/13/1927 80 y.o. 08/10/2014   Principle Diagnosis:  Stage A CLL. 2. 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 3. History of stage I (T1 N0 M0) ductal carcinoma of the left breast,     1999.  Current Therapy:   Observation     Interim History:  [name] is back for followup. She now is off Femara. She do well off this.  She's had no problems since we saw her. She's had a good summer. She's had no problems with cough. There's been no palpable lymph nodes. She's had no abdominal pain. There's been no change in bowel or bladder habits. She's had no leg swelling. She has a little bit of leg swelling today because she hasn't taken her fluid pill in in 2 days. She did have cataract surgery done. This was done back in November. This worked well. She had no palpitations from this.   There's been no change in her medications. Medications: Current outpatient prescriptions:Acetaminophen (TYLENOL ARTHRITIS EXT RELIEF PO), Take by mouth as needed., Disp: , Rfl: ;  aspirin (ASPIRIN EC) 81 MG EC tablet, Take 81 mg by mouth every evening. , Disp: , Rfl: ;  Calcium Carb-Cholecalciferol (CALCIUM 1000 + D PO), Take by mouth every morning., Disp: , Rfl: ;  Cholecalciferol (VITAMIN D-3) 1000 UNITS CAPS, Take by mouth every morning., Disp: , Rfl:  conjugated estrogens (PREMARIN) vaginal cream, Place 1 Applicatorful vaginally once a week., Disp: , Rfl: ;  LORazepam (ATIVAN) 0.5 MG tablet, TAKE 1 TABLET BY MOUTH EVERY DAY AS NEEDED FOR ANXIETY, Disp: 30 tablet, Rfl: 0;  UNKNOWN TO PATIENT, Take by mouth every morning. " FLUID PILL" DOES NOT TAKE ON Sunday  D/T CHURCH, Disp: , Rfl:   Allergies:  Allergies  Allergen Reactions  . Codeine Nausea Only  . Darvocet [Propoxyphene N-Acetaminophen] Nausea Only    Past Medical History, Surgical history, Social history, and Family History were reviewed and updated.  Review of  Systems: As above  Physical Exam:  height is 5' 1" (1.549 m) and weight is 167 lb (75.751 kg). Her oral temperature is 98.3 F (36.8 C). Her blood pressure is 168/62 and her pulse is 69. Her respiration is 16.   Elderly white female in no obvious distress. Head and neck exam show no ocular or oral lesions. There is no adenopathy in the neck. Lungs are clear. Cardiac exam regular rate and rhythm with no murmurs rubs or bruits. Physical exam shows bilateral mastectomies. No chest wall nodules are noted. There is no chest wall erythema. There is no axillary adenopathy bilaterally. Abdomen is soft. Has good bowel sounds. There is no fluid wave. There is no palpable liver or spleen tip. Neck exam shows some slight kyphosis. Extremities shows some trace edema in her lower legs. She is age related osteoarthritic changes. Skin exam shows numerous seborrheic keratoses. Neurological exam is nonfocal.  Lab Results  Component Value Date   WBC 10.8* 08/10/2014   HGB 13.5 08/10/2014   HCT 38.9 08/10/2014   MCV 97 08/10/2014   PLT 111* 08/10/2014     Chemistry      Component Value Date/Time   NA 139 08/10/2014 1016   NA 138 08/12/2013 0924   NA 140 02/17/2013 1037   K 3.7 08/10/2014 1016   K 4.4 08/12/2013 0924   K 4.0 02/17/2013 1037   CL 106 08/10/2014 1016   CL 105   08/12/2013 0924   CL 109* 02/17/2013 1037   CO2 26 08/10/2014 1016   CO2 30 08/12/2013 0924   CO2 24 02/17/2013 1037   BUN 8 08/10/2014 1016   BUN 11 08/12/2013 0924   BUN 9.1 02/17/2013 1037   CREATININE 0.4* 08/10/2014 1016   CREATININE 0.58 08/12/2013 0924   CREATININE 0.7 02/17/2013 1037      Component Value Date/Time   CALCIUM 8.8 08/10/2014 1016   CALCIUM 8.9 08/12/2013 0924   CALCIUM 9.1 02/17/2013 1037   ALKPHOS 145* 08/10/2014 1016   ALKPHOS 130* 08/12/2013 0924   ALKPHOS 138 02/17/2013 1037   AST 69* 08/10/2014 1016   AST 63* 08/12/2013 0924   AST 72* 02/17/2013 1037   ALT 39 08/10/2014 1016   ALT 35 08/12/2013 0924    ALT 46 02/17/2013 1037   BILITOT 1.40 08/10/2014 1016   BILITOT 1.3* 08/12/2013 0924   BILITOT 0.97 02/17/2013 1037         Impression and Plan: [name] is a 80 year old white female. She will be 87 on following She has a history of bilateral breast cancer. Her first breast cancer was on the left breast in 1999. This was stage I. Her second one was a stage I of the right breast. This was diagnosed in 2010. This one was ER positive/HER-2 negative. She has been on Femara. She will complete 5 years of Femara now.  As far as her CLL is concerned, I do not see any problems with this. She is thrombocytopenic but this is holding steady.    I looked at her blood smear and did not see anything that looked unusual. She did have an increase in lymphocytes. These appear to be mature.  I am bothered by her elevated liver tests. These have been elevated since we saw her 6 months ago. Her last ultrasound was done back in 2008. She has some changes consistent with fatty liver. I suspect that these probably are worse. However, we will repeat this.  I don't think that the elevated liver tests have anything to do with her having breast cancer or CLL.  I will plan to see her back in 6 months.  She knows to call us if has any problems before then with bleeding.  I feel we can just follow this along.   Karen Napoleon, MD 10/12/201512:30 PM

## 2014-08-12 LAB — IGG, IGA, IGM
IgA: 222 mg/dL (ref 69–380)
IgG (Immunoglobin G), Serum: 1560 mg/dL (ref 690–1700)
IgM, Serum: 59 mg/dL (ref 52–322)

## 2014-08-12 LAB — PROTEIN ELECTROPHORESIS, SERUM, WITH REFLEX
ALPHA-2-GLOBULIN: 9.1 % (ref 7.1–11.8)
Albumin ELP: 53 % — ABNORMAL LOW (ref 55.8–66.1)
Alpha-1-Globulin: 3.4 % (ref 2.9–4.9)
BETA GLOBULIN: 6.3 % (ref 4.7–7.2)
Beta 2: 4.1 % (ref 3.2–6.5)
Gamma Globulin: 24.1 % — ABNORMAL HIGH (ref 11.1–18.8)
Total Protein, Serum Electrophoresis: 6 g/dL (ref 6.0–8.3)

## 2014-08-12 LAB — LACTATE DEHYDROGENASE: LDH: 232 U/L (ref 94–250)

## 2014-08-13 ENCOUNTER — Telehealth: Payer: Self-pay | Admitting: Nurse Practitioner

## 2014-08-13 NOTE — Telephone Encounter (Addendum)
Message copied by Jimmy Footman on Thu Aug 13, 2014  6:05 PM ------      Message from: Burney Gauze R      Created: Wed Aug 12, 2014  6:59 PM       Call and let her know that the labs are normal. Laurey Arrow ------Pt verbalized understanding and appreciation.

## 2014-08-24 ENCOUNTER — Ambulatory Visit (HOSPITAL_BASED_OUTPATIENT_CLINIC_OR_DEPARTMENT_OTHER): Payer: Medicare Other

## 2014-08-26 ENCOUNTER — Ambulatory Visit (HOSPITAL_BASED_OUTPATIENT_CLINIC_OR_DEPARTMENT_OTHER)
Admission: RE | Admit: 2014-08-26 | Discharge: 2014-08-26 | Disposition: A | Discharge status: Home / Self Care | Payer: Medicare Other | Source: Ambulatory Visit | Attending: Hematology & Oncology | Admitting: Hematology & Oncology

## 2014-08-26 DIAGNOSIS — C50912 Malignant neoplasm of unspecified site of left female breast: Secondary | ICD-10-CM

## 2014-08-26 DIAGNOSIS — R161 Splenomegaly, not elsewhere classified: Secondary | ICD-10-CM | POA: Diagnosis not present

## 2014-08-26 DIAGNOSIS — C9110 Chronic lymphocytic leukemia of B-cell type not having achieved remission: Secondary | ICD-10-CM

## 2014-08-26 DIAGNOSIS — R945 Abnormal results of liver function studies: Secondary | ICD-10-CM | POA: Diagnosis not present

## 2014-08-26 DIAGNOSIS — Z9049 Acquired absence of other specified parts of digestive tract: Secondary | ICD-10-CM | POA: Diagnosis not present

## 2014-08-26 DIAGNOSIS — R7989 Other specified abnormal findings of blood chemistry: Secondary | ICD-10-CM

## 2014-08-27 ENCOUNTER — Telehealth: Payer: Self-pay | Admitting: *Deleted

## 2014-08-27 NOTE — Telephone Encounter (Addendum)
Notified patient. She is seeing her PCP today. She was appreciative for the information.    Message copied by Cordelia Poche on Thu Aug 27, 2014  9:24 AM ------      Message from: Burney Gauze R      Created: Thu Aug 27, 2014  7:06 AM       Please call with her know that she has some liver cirrhosis and because of this, her spleen is a little bit enlarged. She needs to see her family doctor about the liver issues. Pete ------

## 2014-09-30 ENCOUNTER — Other Ambulatory Visit (HOSPITAL_COMMUNITY)
Admission: RE | Admit: 2014-09-30 | Discharge: 2014-09-30 | Disposition: A | Discharge status: Home / Self Care | Payer: Medicare Other | Source: Ambulatory Visit | Attending: Obstetrics & Gynecology | Admitting: Obstetrics & Gynecology

## 2014-09-30 ENCOUNTER — Other Ambulatory Visit: Payer: Self-pay | Admitting: Obstetrics & Gynecology

## 2014-09-30 DIAGNOSIS — Z124 Encounter for screening for malignant neoplasm of cervix: Secondary | ICD-10-CM | POA: Diagnosis present

## 2014-09-30 DIAGNOSIS — Z1151 Encounter for screening for human papillomavirus (HPV): Secondary | ICD-10-CM | POA: Diagnosis present

## 2014-10-02 LAB — CYTOLOGY - PAP

## 2014-10-06 ENCOUNTER — Other Ambulatory Visit: Payer: Self-pay | Admitting: Obstetrics & Gynecology

## 2015-02-08 ENCOUNTER — Other Ambulatory Visit (HOSPITAL_BASED_OUTPATIENT_CLINIC_OR_DEPARTMENT_OTHER): Payer: Medicare Other

## 2015-02-08 ENCOUNTER — Encounter: Payer: Self-pay | Admitting: Family

## 2015-02-08 ENCOUNTER — Ambulatory Visit (HOSPITAL_BASED_OUTPATIENT_CLINIC_OR_DEPARTMENT_OTHER): Payer: Medicare Other | Admitting: Family

## 2015-02-08 VITALS — BP 161/61 | HR 71 | Temp 97.7°F | Resp 14 | Ht 61.0 in | Wt 166.0 lb

## 2015-02-08 DIAGNOSIS — R945 Abnormal results of liver function studies: Secondary | ICD-10-CM

## 2015-02-08 DIAGNOSIS — R7989 Other specified abnormal findings of blood chemistry: Secondary | ICD-10-CM

## 2015-02-08 DIAGNOSIS — C9110 Chronic lymphocytic leukemia of B-cell type not having achieved remission: Secondary | ICD-10-CM

## 2015-02-08 DIAGNOSIS — C50912 Malignant neoplasm of unspecified site of left female breast: Secondary | ICD-10-CM

## 2015-02-08 DIAGNOSIS — Z853 Personal history of malignant neoplasm of breast: Secondary | ICD-10-CM

## 2015-02-08 DIAGNOSIS — Z856 Personal history of leukemia: Secondary | ICD-10-CM

## 2015-02-08 LAB — CBC WITH DIFFERENTIAL (CANCER CENTER ONLY)
BASO#: 0.3 10*3/uL — ABNORMAL HIGH (ref 0.0–0.2)
BASO%: 2 % (ref 0.0–2.0)
EOS%: 1.9 % (ref 0.0–7.0)
Eosinophils Absolute: 0.2 10*3/uL (ref 0.0–0.5)
HCT: 40.9 % (ref 34.8–46.6)
HEMOGLOBIN: 14.2 g/dL (ref 11.6–15.9)
LYMPH#: 9.1 10*3/uL — ABNORMAL HIGH (ref 0.9–3.3)
LYMPH%: 73.6 % — ABNORMAL HIGH (ref 14.0–48.0)
MCH: 33.6 pg (ref 26.0–34.0)
MCHC: 34.7 g/dL (ref 32.0–36.0)
MCV: 97 fL (ref 81–101)
MONO#: 0.7 10*3/uL (ref 0.1–0.9)
MONO%: 5.7 % (ref 0.0–13.0)
NEUT%: 16.8 % — ABNORMAL LOW (ref 39.6–80.0)
NEUTROS ABS: 2.1 10*3/uL (ref 1.5–6.5)
Platelets: 104 10*3/uL — ABNORMAL LOW (ref 145–400)
RBC: 4.23 10*6/uL (ref 3.70–5.32)
RDW: 14 % (ref 11.1–15.7)
WBC: 12.4 10*3/uL — ABNORMAL HIGH (ref 3.9–10.0)

## 2015-02-08 LAB — COMPREHENSIVE METABOLIC PANEL
ALT: 32 U/L (ref 0–35)
AST: 63 U/L — ABNORMAL HIGH (ref 0–37)
Albumin: 3 g/dL — ABNORMAL LOW (ref 3.5–5.2)
Alkaline Phosphatase: 139 U/L — ABNORMAL HIGH (ref 39–117)
BUN: 14 mg/dL (ref 6–23)
CO2: 25 mEq/L (ref 19–32)
Calcium: 8.6 mg/dL (ref 8.4–10.5)
Chloride: 107 mEq/L (ref 96–112)
Creatinine, Ser: 0.68 mg/dL (ref 0.50–1.10)
GLUCOSE: 110 mg/dL — AB (ref 70–99)
POTASSIUM: 3.9 meq/L (ref 3.5–5.3)
Sodium: 140 mEq/L (ref 135–145)
Total Bilirubin: 1.4 mg/dL — ABNORMAL HIGH (ref 0.2–1.2)
Total Protein: 6.3 g/dL (ref 6.0–8.3)

## 2015-02-08 LAB — TECHNOLOGIST REVIEW CHCC SATELLITE

## 2015-02-08 LAB — CHCC SATELLITE - SMEAR

## 2015-02-08 NOTE — Progress Notes (Signed)
Hematology and Oncology Follow Up Visit  Karen Richard [id number] 04-12-1927 81 y.o. 02/08/2015   Principle Diagnosis:  1. Stage A CLL. 2. 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 3. History of stage I (T1 N0 M0) ductal carcinoma of the left breast,  1999.  Current Therapy:   Observation    Interim History:  Karen Richard is here today with her husband for a follow-up. Sh completed her 5 years of Femara last October. She is doing well and has no complaints at this time.  She had her pessary removed and is now off of Premarin.  She has had no issues with infections. No fever, chills, n/v, cough, rash, dizziness, SOB, chest pain, palpitations, abdominal pain, constipation, diarrhea, blood in her urine or stool. No lymphadenopathy.  No tenderness, numbness or tingling in her extremities. She takes Lasix Monday -Saturday to prevent leg swelling. She does not take it on Sunday so she can go to church.  No new aches or pains.  Her appetite is good and she is staying hydrated. Her weight is stable.   Medications:    Medication List       This list is accurate as of: 02/08/15 10:10 AM.  Always use your most recent med list.               aspirin EC 81 MG EC tablet  Generic drug:  aspirin  Take 81 mg by mouth every evening.     calcium-vitamin D 500-200 MG-UNIT per tablet  Take 1 tablet by mouth daily.     furosemide 40 MG tablet  Commonly known as:  LASIX  Take 40 mg by mouth every morning. DOES NOT TAKE ON SUNDAY     KLOR-CON M20 20 MEQ tablet  Generic drug:  potassium chloride SA  Take 20 mEq by mouth every morning.     LORazepam 0.5 MG tablet  Commonly known as:  ATIVAN  TAKE 1 TABLET BY MOUTH EVERY DAY AS NEEDED FOR ANXIETY     TYLENOL ARTHRITIS EXT RELIEF PO  Take by mouth as needed.        Allergies:  Allergies  Allergen Reactions  . Codeine Nausea Only  . Darvocet [Propoxyphene N-Acetaminophen] Nausea Only    Past Medical History,  Surgical history, Social history, and Family History were reviewed and updated.  Review of Systems: All other 10 point review of systems is negative.   Physical Exam:  height is 5' 1"  (1.549 m) and weight is 166 lb (75.297 kg). Her oral temperature is 97.7 F (36.5 C). Her blood pressure is 161/61 and her pulse is 71. Her respiration is 14.   Wt Readings from Last 3 Encounters:  02/08/15 166 lb (75.297 kg)  08/10/14 167 lb (75.751 kg)  02/10/14 160 lb (72.576 kg)    Ocular: Sclerae unicteric, pupils equal, round and reactive to light Ear-nose-throat: Oropharynx clear, dentition fair Lymphatic: No cervical or supraclavicular adenopathy Lungs no rales or rhonchi, good excursion bilaterally Heart regular rate and rhythm, no murmur appreciated Abd soft, nontender, positive bowel sounds MSK no focal spinal tenderness, no joint edema Neuro: non-focal, well-oriented, appropriate affect Breasts: No changes. No mass, lesion, rash or lymphadenopathy.   Lab Results  Component Value Date   WBC 12.4* 02/08/2015   HGB 14.2 02/08/2015   HCT 40.9 02/08/2015   MCV 97 02/08/2015   PLT 104* 02/08/2015   No results found for: FERRITIN, IRON, TIBC, UIBC, IRONPCTSAT Lab Results  Component  Value Date   RBC 4.23 02/08/2015   No results found for: [facility] Lab Results  Component Value Date   IGGSERUM 1560 08/10/2014   IGA 222 08/10/2014   IGMSERUM 59 08/10/2014   Lab Results  Component Value Date   TOTALPROTELP 6.0 08/10/2014   ALBUMINELP 53.0* 08/10/2014   A1GS 3.4 08/10/2014   A2GS 9.1 08/10/2014   BETS 6.3 08/10/2014   BETA2SER 4.1 08/10/2014   GAMS 24.1* 08/10/2014   MSPIKE NOT DET 08/10/2014   SPEI * 08/10/2014     Chemistry      Component Value Date/Time   NA 139 08/10/2014 1016   NA 138 08/12/2013 0924   NA 140 02/17/2013 1037   K 3.7 08/10/2014 1016   K 4.4 08/12/2013 0924   K 4.0 02/17/2013 1037   CL 106 08/10/2014 1016   CL 105  08/12/2013 0924   CL 109* 02/17/2013 1037   CO2 26 08/10/2014 1016   CO2 30 08/12/2013 0924   CO2 24 02/17/2013 1037   BUN 8 08/10/2014 1016   BUN 11 08/12/2013 0924   BUN 9.1 02/17/2013 1037   CREATININE 0.4* 08/10/2014 1016   CREATININE 0.58 08/12/2013 0924   CREATININE 0.7 02/17/2013 1037      Component Value Date/Time   CALCIUM 8.8 08/10/2014 1016   CALCIUM 8.9 08/12/2013 0924   CALCIUM 9.1 02/17/2013 1037   ALKPHOS 145* 08/10/2014 1016   ALKPHOS 130* 08/12/2013 0924   ALKPHOS 138 02/17/2013 1037   AST 69* 08/10/2014 1016   AST 63* 08/12/2013 0924   AST 72* 02/17/2013 1037   ALT 39 08/10/2014 1016   ALT 35 08/12/2013 0924   ALT 46 02/17/2013 1037   BILITOT 1.40 08/10/2014 1016   BILITOT 1.3* 08/12/2013 0924   BILITOT 0.97 02/17/2013 1037     Impression and Plan: Karen Richard is a 81 year old white female with a history of bilateral breast cancer. Her first breast cancer was on the left breast in 1999. This was stage I. Her second one was a stage I of the right breast. This was diagnosed in 2010 and was ER positive/HER-2 negative. She completed 5 years of Femara in October 2015.  So far, she has had no problems with her CLL. Her platelet count is holding at 104. Her LFT's are slightly improved.  We will see her back in 6 months for labs and follow-up.  She knows to call here with any questions or concerns. We can see her sooner if need be.   Eliezer Bottom, NP 4/11/201610:10 AM

## 2015-05-20 ENCOUNTER — Other Ambulatory Visit: Payer: Self-pay | Admitting: Family Medicine

## 2015-05-20 ENCOUNTER — Ambulatory Visit
Admission: RE | Admit: 2015-05-20 | Discharge: 2015-05-20 | Disposition: A | Discharge status: Home / Self Care | Payer: Medicare Other | Source: Ambulatory Visit | Attending: Family Medicine | Admitting: Family Medicine

## 2015-05-20 DIAGNOSIS — R609 Edema, unspecified: Secondary | ICD-10-CM

## 2015-08-06 ENCOUNTER — Other Ambulatory Visit: Payer: Self-pay | Admitting: Nurse Practitioner

## 2015-08-06 DIAGNOSIS — C50912 Malignant neoplasm of unspecified site of left female breast: Secondary | ICD-10-CM

## 2015-08-09 ENCOUNTER — Other Ambulatory Visit (HOSPITAL_BASED_OUTPATIENT_CLINIC_OR_DEPARTMENT_OTHER): Payer: Medicare Other

## 2015-08-09 ENCOUNTER — Encounter: Payer: Self-pay | Admitting: Hematology & Oncology

## 2015-08-09 ENCOUNTER — Ambulatory Visit (HOSPITAL_BASED_OUTPATIENT_CLINIC_OR_DEPARTMENT_OTHER): Payer: Medicare Other

## 2015-08-09 ENCOUNTER — Ambulatory Visit (HOSPITAL_BASED_OUTPATIENT_CLINIC_OR_DEPARTMENT_OTHER): Payer: Medicare Other | Admitting: Hematology & Oncology

## 2015-08-09 VITALS — BP 163/57 | HR 88 | Temp 98.1°F | Resp 16 | Ht 61.0 in | Wt 169.0 lb

## 2015-08-09 DIAGNOSIS — C9110 Chronic lymphocytic leukemia of B-cell type not having achieved remission: Secondary | ICD-10-CM | POA: Diagnosis not present

## 2015-08-09 DIAGNOSIS — Z23 Encounter for immunization: Secondary | ICD-10-CM | POA: Diagnosis not present

## 2015-08-09 DIAGNOSIS — R600 Localized edema: Secondary | ICD-10-CM

## 2015-08-09 DIAGNOSIS — C50411 Malignant neoplasm of upper-outer quadrant of right female breast: Secondary | ICD-10-CM

## 2015-08-09 DIAGNOSIS — C50011 Malignant neoplasm of nipple and areola, right female breast: Secondary | ICD-10-CM

## 2015-08-09 DIAGNOSIS — C50012 Malignant neoplasm of nipple and areola, left female breast: Secondary | ICD-10-CM

## 2015-08-09 DIAGNOSIS — C50912 Malignant neoplasm of unspecified site of left female breast: Secondary | ICD-10-CM

## 2015-08-09 LAB — CBC WITH DIFFERENTIAL (CANCER CENTER ONLY)
HCT: 39.7 % (ref 34.8–46.6)
HEMOGLOBIN: 13.7 g/dL (ref 11.6–15.9)
MCH: 33.3 pg (ref 26.0–34.0)
MCHC: 34.5 g/dL (ref 32.0–36.0)
MCV: 96 fL (ref 81–101)
Platelets: 101 10*3/uL — ABNORMAL LOW (ref 145–400)
RBC: 4.12 10*6/uL (ref 3.70–5.32)
RDW: 14.2 % (ref 11.1–15.7)
WBC: 16.1 10*3/uL — ABNORMAL HIGH (ref 3.9–10.0)

## 2015-08-09 LAB — MANUAL DIFFERENTIAL (CHCC SATELLITE)
ALC: 4.5 10*3/uL — AB (ref 0.6–2.2)
ANC (CHCC MAN DIFF): 10.4 10*3/uL — AB (ref 1.5–6.7)
Eos: 3 % (ref 0–7)
LYMPH: 28 % (ref 14–48)
MONO: 4 % (ref 0–13)
PLATELET MORPHOLOGY: NORMAL
PLT EST ~~LOC~~: DECREASED
SEG: 65 % (ref 40–75)

## 2015-08-09 LAB — CHCC SATELLITE - SMEAR

## 2015-08-09 MED ORDER — INFLUENZA VAC SPLIT QUAD 0.5 ML IM SUSY
0.5000 mL | PREFILLED_SYRINGE | Freq: Once | INTRAMUSCULAR | Status: AC
Start: 2015-08-09 — End: 2015-08-09
  Administered 2015-08-09: 0.5 mL via INTRAMUSCULAR
  Filled 2015-08-09: qty 0.5

## 2015-08-09 NOTE — Patient Instructions (Signed)

## 2015-08-10 ENCOUNTER — Telehealth: Payer: Self-pay | Admitting: *Deleted

## 2015-08-10 ENCOUNTER — Encounter: Payer: Self-pay | Admitting: *Deleted

## 2015-08-10 DIAGNOSIS — E559 Vitamin D deficiency, unspecified: Secondary | ICD-10-CM

## 2015-08-10 LAB — VITAMIN D 25 HYDROXY (VIT D DEFICIENCY, FRACTURES): Vit D, 25-Hydroxy: 23 ng/mL — ABNORMAL LOW (ref 30–100)

## 2015-08-10 MED ORDER — ERGOCALCIFEROL 1.25 MG (50000 UT) PO CAPS: 50000.0000 [IU] | ORAL_CAPSULE | ORAL | Status: DC

## 2015-08-10 NOTE — Progress Notes (Signed)
Hematology and Oncology Follow Up Visit  Karen Richard [id number] 1927-01-26 81 y.o. 08/10/2015   Principle Diagnosis:  Stage A CLL. 2. 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 3. History of stage I (T1 N0 M0) ductal carcinoma of the left breast,     1999.  Current Therapy:   Observation     Interim History:  Ms.  Richard is back for followup. We last saw her back in April. She has been doing okay. She has been on Femara now for about a year.  Her one problem has been swelling in her legs. She had a chest x-ray done back in July. Nothing was the x-ray shows stable changes. She had some mildly enlarged cardiac count tors. There is no obvious pulmonary edema.   There's been no change in her medications. She really is not taking much medications.  We did check her vitamin D level. Her level is only 23. I think she probably needs 50,000 units a week.  She's had no cough. There's been no shortness of breath.  She's had no rashes. She's had no bleeding. There's been no fever.  Overall, her performance status is ECOG 1.   Medications:  Current outpatient prescriptions:  .  Acetaminophen (TYLENOL ARTHRITIS EXT RELIEF PO), Take by mouth as needed., Disp: , Rfl:  .  aspirin (ASPIRIN EC) 81 MG EC tablet, Take 81 mg by mouth every evening. , Disp: , Rfl:  .  Calcium Carbonate-Vitamin D (CALCIUM-VITAMIN D) 500-200 MG-UNIT per tablet, Take 1 tablet by mouth daily., Disp: , Rfl:  .  LORazepam (ATIVAN) 0.5 MG tablet, TAKE 1 TABLET BY MOUTH EVERY DAY AS NEEDED FOR ANXIETY, Disp: 30 tablet, Rfl: 0 .  oxybutynin (DITROPAN-XL) 5 MG 24 hr tablet, Take 5 mg by mouth daily., Disp: , Rfl: 5  Allergies:  Allergies  Allergen Reactions  . Codeine Nausea Only  . Darvocet [Propoxyphene N-Acetaminophen] Nausea Only    Past Medical History, Surgical history, Social history, and Family History were reviewed and updated.  Review of Systems: As above  Physical Exam:  height is 5'  1" (1.549 m) and weight is 169 lb (76.658 kg). Her oral temperature is 98.1 F (36.7 C). Her blood pressure is 163/57 and her pulse is 88. Her respiration is 16.   Elderly white female in no obvious distress. Head and neck exam show no ocular or oral lesions. There is no adenopathy in the neck. Lungs are clear. Cardiac exam regular rate and rhythm with no murmurs rubs or bruits. Physical exam shows bilateral mastectomies. No chest wall nodules are noted. There is no chest wall erythema. There is no axillary adenopathy bilaterally. Abdomen is soft. Has good bowel sounds. There is no fluid wave. There is no palpable liver or spleen tip. Neck exam shows some slight kyphosis. Extremities shows some trace edema in her lower legs. She is age related osteoarthritic changes. Skin exam shows numerous seborrheic keratoses. Neurological exam is nonfocal.  Lab Results  Component Value Date   WBC 16.1* 08/09/2015   HGB 13.7 08/09/2015   HCT 39.7 08/09/2015   MCV 96 08/09/2015   PLT 101* 08/09/2015     Chemistry      Component Value Date/Time   NA 140 02/08/2015 0918   NA 139 08/10/2014 1016   NA 140 02/17/2013 1037   K 3.9 02/08/2015 0918   K 3.7 08/10/2014 1016   K 4.0 02/17/2013 1037   CL 107 02/08/2015 0277  CL 106 08/10/2014 1016   CL 109* 02/17/2013 1037   CO2 25 02/08/2015 0918   CO2 26 08/10/2014 1016   CO2 24 02/17/2013 1037   BUN 14 02/08/2015 0918   BUN 8 08/10/2014 1016   BUN 9.1 02/17/2013 1037   CREATININE 0.68 02/08/2015 0918   CREATININE 0.4* 08/10/2014 1016   CREATININE 0.7 02/17/2013 1037      Component Value Date/Time   CALCIUM 8.6 02/08/2015 0918   CALCIUM 8.8 08/10/2014 1016   CALCIUM 9.1 02/17/2013 1037   ALKPHOS 139* 02/08/2015 0918   ALKPHOS 145* 08/10/2014 1016   ALKPHOS 138 02/17/2013 1037   AST 63* 02/08/2015 0918   AST 69* 08/10/2014 1016   AST 72* 02/17/2013 1037   ALT 32 02/08/2015 0918   ALT 39 08/10/2014 1016   ALT 46 02/17/2013 1037   BILITOT 1.4*  02/08/2015 0918   BILITOT 1.40 08/10/2014 1016   BILITOT 0.97 02/17/2013 1037         Impression and Plan: Karen Richard is a61 year old white female.   From my point of view, everything looks pretty stable. Her white cell count is trending upward. This could be indicative of her CLL.  I can only think of lymphadenopathy in the abdomen and pelvis that might be causing the edema in her legs. I think that the only way we would know is to do a CT scan. I talked to she and her husband about this. They just wish to hold off for right now.  Otherwise, everything looks okay. Again, her vitamin D level is on the low side.  I will plan to see her back in 6 months.  She knows to call us if has any problems before then with bleeding.     Volanda Napoleon, MD 10/11/20167:35 AM

## 2015-08-10 NOTE — Telephone Encounter (Addendum)
Attempted multiple times to call number. Phone will not connect. Letter sent, medication sent to pharmacy.   ----- Message from Volanda Napoleon, MD sent at 08/10/2015  7:33 AM EDT ----- Call - vit D is low!!  She needs 50000 units a week!!!  Please call this in!!  pete

## 2015-08-18 ENCOUNTER — Telehealth: Payer: Self-pay | Admitting: *Deleted

## 2015-08-18 NOTE — Telephone Encounter (Signed)
Patient has been experiencing dizziness since starting her vitamin D prescription. Spoke with Dr Marin Olp who doesn't believe that the vitamin D is related to the symptom. Patient really feels like the vitamin D is the only possibility. Instructed patient to follow up with her PCP to ensure there was no other potential cause to her dizziness. She understood but verbalized that she may just stop taking the vitamin D. Dr Marin Olp aware.

## 2016-02-07 ENCOUNTER — Other Ambulatory Visit (HOSPITAL_BASED_OUTPATIENT_CLINIC_OR_DEPARTMENT_OTHER): Payer: Medicare Other

## 2016-02-07 ENCOUNTER — Encounter: Payer: Self-pay | Admitting: Hematology & Oncology

## 2016-02-07 ENCOUNTER — Ambulatory Visit (HOSPITAL_BASED_OUTPATIENT_CLINIC_OR_DEPARTMENT_OTHER): Payer: Medicare Other | Admitting: Hematology & Oncology

## 2016-02-07 VITALS — BP 155/45 | HR 80 | Temp 98.1°F | Resp 18 | Ht 61.0 in | Wt 171.0 lb

## 2016-02-07 DIAGNOSIS — C50012 Malignant neoplasm of nipple and areola, left female breast: Secondary | ICD-10-CM

## 2016-02-07 DIAGNOSIS — R600 Localized edema: Secondary | ICD-10-CM | POA: Diagnosis not present

## 2016-02-07 DIAGNOSIS — C9110 Chronic lymphocytic leukemia of B-cell type not having achieved remission: Secondary | ICD-10-CM

## 2016-02-07 DIAGNOSIS — Z853 Personal history of malignant neoplasm of breast: Secondary | ICD-10-CM | POA: Diagnosis not present

## 2016-02-07 DIAGNOSIS — Z23 Encounter for immunization: Secondary | ICD-10-CM

## 2016-02-07 DIAGNOSIS — C50011 Malignant neoplasm of nipple and areola, right female breast: Secondary | ICD-10-CM

## 2016-02-07 LAB — COMPREHENSIVE METABOLIC PANEL
ALBUMIN: 2.9 g/dL — AB (ref 3.5–5.0)
ALK PHOS: 226 U/L — AB (ref 40–150)
ALT: 39 U/L (ref 0–55)
AST: 84 U/L — AB (ref 5–34)
Anion Gap: 6 mEq/L (ref 3–11)
BUN: 10.8 mg/dL (ref 7.0–26.0)
CHLORIDE: 108 meq/L (ref 98–109)
CO2: 26 mEq/L (ref 22–29)
Calcium: 8.6 mg/dL (ref 8.4–10.4)
Creatinine: 0.7 mg/dL (ref 0.6–1.1)
EGFR: 74 mL/min/{1.73_m2} — AB (ref >90)
GLUCOSE: 145 mg/dL — AB (ref 70–140)
POTASSIUM: 4.1 meq/L (ref 3.5–5.1)
SODIUM: 140 meq/L (ref 136–145)
Total Bilirubin: 1.81 mg/dL — ABNORMAL HIGH (ref 0.20–1.20)
Total Protein: 6.3 g/dL — ABNORMAL LOW (ref 6.4–8.3)

## 2016-02-07 LAB — CBC WITH DIFFERENTIAL (CANCER CENTER ONLY)
HEMATOCRIT: 39.1 % (ref 34.8–46.6)
HGB: 13.6 g/dL (ref 11.6–15.9)
MCH: 33.3 pg (ref 26.0–34.0)
MCHC: 34.8 g/dL (ref 32.0–36.0)
MCV: 96 fL (ref 81–101)
PLATELETS: 97 10*3/uL — AB (ref 145–400)
RBC: 4.09 10*6/uL (ref 3.70–5.32)
RDW: 15.2 % (ref 11.1–15.7)
WBC: 14.7 10*3/uL — AB (ref 3.9–10.0)

## 2016-02-07 LAB — MANUAL DIFFERENTIAL (CHCC SATELLITE)
ALC: 8.7 10*3/uL — AB (ref 0.6–2.2)
ANC (CHCC HP manual diff): 4.6 10*3/uL (ref 1.5–6.7)
BASO: 1 % (ref 0–2)
Eos: 2 % (ref 0–7)
LYMPH: 59 % — ABNORMAL HIGH (ref 14–48)
MONO: 7 % (ref 0–13)
PLT EST ~~LOC~~: DECREASED
Platelet Morphology: NORMAL
RBC Comments: NORMAL
SEG: 31 % — AB (ref 40–75)

## 2016-02-07 LAB — IRON AND TIBC
%SAT: 72 % — ABNORMAL HIGH (ref 21–57)
Iron: 176 ug/dL — ABNORMAL HIGH (ref 41–142)
TIBC: 246 ug/dL (ref 236–444)
UIBC: 70 ug/dL — ABNORMAL LOW (ref 120–384)

## 2016-02-07 LAB — CHCC SATELLITE - SMEAR

## 2016-02-07 LAB — FERRITIN: FERRITIN: 358 ng/mL — AB (ref 9–269)

## 2016-02-08 ENCOUNTER — Ambulatory Visit (HOSPITAL_BASED_OUTPATIENT_CLINIC_OR_DEPARTMENT_OTHER)
Admission: RE | Admit: 2016-02-08 | Discharge: 2016-02-08 | Disposition: A | Discharge status: Home / Self Care | Payer: Medicare Other | Source: Ambulatory Visit | Attending: Hematology & Oncology | Admitting: Hematology & Oncology

## 2016-02-08 ENCOUNTER — Encounter (HOSPITAL_BASED_OUTPATIENT_CLINIC_OR_DEPARTMENT_OTHER): Payer: Self-pay

## 2016-02-08 DIAGNOSIS — C9110 Chronic lymphocytic leukemia of B-cell type not having achieved remission: Secondary | ICD-10-CM | POA: Insufficient documentation

## 2016-02-08 DIAGNOSIS — I8500 Esophageal varices without bleeding: Secondary | ICD-10-CM | POA: Insufficient documentation

## 2016-02-08 DIAGNOSIS — K7460 Unspecified cirrhosis of liver: Secondary | ICD-10-CM | POA: Diagnosis not present

## 2016-02-08 DIAGNOSIS — R609 Edema, unspecified: Secondary | ICD-10-CM | POA: Diagnosis not present

## 2016-02-08 DIAGNOSIS — Z9013 Acquired absence of bilateral breasts and nipples: Secondary | ICD-10-CM | POA: Diagnosis not present

## 2016-02-08 MED ORDER — IOPAMIDOL (ISOVUE-300) INJECTION 61%
100.0000 mL | Freq: Once | INTRAVENOUS | Status: AC | PRN
  Administered 2016-02-08: 100 mL via INTRAVENOUS

## 2016-02-08 NOTE — Progress Notes (Signed)
Hematology and Oncology Follow Up Visit  Karen Richard [id number] [birth year] 82 y.o. 02/08/2016   Principle Diagnosis:  Stage A CLL. 2. 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 3. History of stage I (T1 N0 M0) ductal carcinoma of the left breast,     1999.  Current Therapy:   Observation     Interim History:  Karen Richard is back for followup. She is having some difficulties. She's having some leg swelling area and this is been going on for a couple months. I might sure if she is going to her family doctor about this. I know she has an appointment with him in a week or so.  She's had no fever. She's had no infections. She's had no rashes. She's had no change in bowel or bladder habits. She's had no cough or shortness of breath. She's had no palpitations.  Her vitamin D level was low back in October with last saw her. She is taking vitamin D. She is on this weekly.   She has had no change in medications.   Overall, her performance status is ECOG 1.   Medications:  Current outpatient prescriptions:  .  Acetaminophen (TYLENOL ARTHRITIS EXT RELIEF PO), Take by mouth as needed., Disp: , Rfl:  .  aspirin (ASPIRIN EC) 81 MG EC tablet, Take 81 mg by mouth every evening. , Disp: , Rfl:  .  Calcium Carbonate-Vitamin D (CALCIUM-VITAMIN D) 500-200 MG-UNIT per tablet, Take 1 tablet by mouth daily., Disp: , Rfl:  .  ergocalciferol (VITAMIN D2) 50000 UNITS capsule, Take 1 capsule (50,000 Units total) by mouth once a week., Disp: 8 capsule, Rfl: 3 .  furosemide (LASIX) 20 MG tablet, Take 20 mg by mouth daily., Disp: , Rfl:  .  LORazepam (ATIVAN) 0.5 MG tablet, TAKE 1 TABLET BY MOUTH EVERY DAY AS NEEDED FOR ANXIETY, Disp: 30 tablet, Rfl: 0  Allergies:  Allergies  Allergen Reactions  . Codeine Nausea Only  . Darvocet [Propoxyphene N-Acetaminophen] Nausea Only    Past Medical History, Surgical history, Social history, and Family History were reviewed and updated.  Review  of Systems: As above  Physical Exam:  height is 5' 1"  (1.549 m) and weight is 171 lb (77.565 kg). Her oral temperature is 98.1 F (36.7 C). Her blood pressure is 155/45 and her pulse is 80. Her respiration is 18.   Elderly white female in no obvious distress. Head and neck exam show no ocular or oral lesions. There is no adenopathy in the neck. Lungs are clear. Cardiac exam regular rate and rhythm with no murmurs rubs or bruits. Physical exam shows bilateral mastectomies. No chest wall nodules are noted. There is no chest wall erythema. There is no axillary adenopathy bilaterally. Abdomen is soft. Has good bowel sounds. There is no fluid wave. There is no palpable liver or spleen tip. Neck exam shows some slight kyphosis. Extremities shows some trace edema in her lower legs. She is age related osteoarthritic changes. Skin exam shows numerous seborrheic keratoses. Neurological exam is nonfocal.  Lab Results  Component Value Date   WBC 14.7* 02/07/2016   HGB 13.6 02/07/2016   HCT 39.1 02/07/2016   MCV 96 02/07/2016   PLT 97* 02/07/2016     Chemistry      Component Value Date/Time   NA 140 02/07/2016 0925   NA 140 02/08/2015 0918   NA 139 08/10/2014 1016   K 4.1 02/07/2016 0925   K 3.9 02/08/2015 7829  K 3.7 08/10/2014 1016   CL 107 02/08/2015 0918   CL 106 08/10/2014 1016   CL 109* 02/17/2013 1037   CO2 26 02/07/2016 0925   CO2 25 02/08/2015 0918   CO2 26 08/10/2014 1016   BUN 10.8 02/07/2016 0925   BUN 14 02/08/2015 0918   BUN 8 08/10/2014 1016   CREATININE 0.7 02/07/2016 0925   CREATININE 0.68 02/08/2015 0918   CREATININE 0.4* 08/10/2014 1016      Component Value Date/Time   CALCIUM 8.6 02/07/2016 0925   CALCIUM 8.6 02/08/2015 0918   CALCIUM 8.8 08/10/2014 1016   ALKPHOS 226* 02/07/2016 0925   ALKPHOS 139* 02/08/2015 0918   ALKPHOS 145* 08/10/2014 1016   AST 84* 02/07/2016 0925   AST 63* 02/08/2015 0918   AST 69* 08/10/2014 1016   ALT 39 02/07/2016 0925   ALT 32  02/08/2015 0918   ALT 39 08/10/2014 1016   BILITOT 1.81* 02/07/2016 0925   BILITOT 1.4* 02/08/2015 0918   BILITOT 1.40 08/10/2014 1016         Impression and Plan: Karen Richard is n 82 year old white female.   From my point of view, everything looks pretty stable. I'm not sure is why she has the swelling in her legs. I think that she needs a CT scan of the body to see if she has lymphadenopathy that might be causing some of the swelling. Her white cell count certainly is not trending upward. I cannot palpate any lymphadenopathy or splenomegaly.  I think that she probably also needs to have an echocardiogram to check her cardiac status. I will leave that decision up to her family doctor.  I would like to see her back in another 4-6 weeks for follow-up.  I spent about 30 minutes with more she and her husband.   Volanda Napoleon, MD 4/11/20177:38 AM

## 2016-02-10 ENCOUNTER — Telehealth: Payer: Self-pay | Admitting: Nurse Practitioner

## 2016-02-10 NOTE — Telephone Encounter (Addendum)
Pt verbalized understanding and is scheduled to see her PCP Dr. Joneen Caraway next Wednesday.----- Message from Volanda Napoleon, MD sent at 02/09/2016  7:11 AM EDT ----- Call - NO changes that show that the CLL is a problem!!!  She does have cirrhosis of the liver -- this is what is probably causing the leg swelling.  Her family MD will need to deal with this issue.  Please make sure that her family MD gets a copy of the CT scan.  Laurey Arrow

## 2016-02-29 ENCOUNTER — Other Ambulatory Visit: Payer: Self-pay

## 2016-02-29 ENCOUNTER — Other Ambulatory Visit (HOSPITAL_BASED_OUTPATIENT_CLINIC_OR_DEPARTMENT_OTHER): Payer: Self-pay

## 2016-02-29 ENCOUNTER — Encounter: Payer: Self-pay | Admitting: Hematology & Oncology

## 2016-02-29 ENCOUNTER — Ambulatory Visit (HOSPITAL_BASED_OUTPATIENT_CLINIC_OR_DEPARTMENT_OTHER): Payer: Medicare Other | Admitting: Hematology & Oncology

## 2016-02-29 ENCOUNTER — Other Ambulatory Visit (HOSPITAL_BASED_OUTPATIENT_CLINIC_OR_DEPARTMENT_OTHER): Payer: Medicare Other

## 2016-02-29 VITALS — BP 146/48 | HR 70 | Temp 97.9°F | Resp 18 | Ht 61.0 in | Wt 171.0 lb

## 2016-02-29 DIAGNOSIS — I340 Nonrheumatic mitral (valve) insufficiency: Secondary | ICD-10-CM | POA: Diagnosis not present

## 2016-02-29 DIAGNOSIS — R609 Edema, unspecified: Secondary | ICD-10-CM | POA: Diagnosis not present

## 2016-02-29 DIAGNOSIS — I071 Rheumatic tricuspid insufficiency: Secondary | ICD-10-CM | POA: Diagnosis not present

## 2016-02-29 DIAGNOSIS — C9110 Chronic lymphocytic leukemia of B-cell type not having achieved remission: Secondary | ICD-10-CM

## 2016-02-29 DIAGNOSIS — C50011 Malignant neoplasm of nipple and areola, right female breast: Secondary | ICD-10-CM | POA: Diagnosis not present

## 2016-02-29 DIAGNOSIS — R9431 Abnormal electrocardiogram [ECG] [EKG]: Secondary | ICD-10-CM

## 2016-02-29 DIAGNOSIS — C50012 Malignant neoplasm of nipple and areola, left female breast: Secondary | ICD-10-CM | POA: Diagnosis not present

## 2016-02-29 DIAGNOSIS — I517 Cardiomegaly: Secondary | ICD-10-CM | POA: Diagnosis not present

## 2016-02-29 LAB — CBC WITH DIFFERENTIAL (CANCER CENTER ONLY)
HCT: 36.2 % (ref 34.8–46.6)
HGB: 12.7 g/dL (ref 11.6–15.9)
MCH: 33.7 pg (ref 26.0–34.0)
MCHC: 35.1 g/dL (ref 32.0–36.0)
MCV: 96 fL (ref 81–101)
PLATELETS: 90 10*3/uL — AB (ref 145–400)
RBC: 3.77 10*6/uL (ref 3.70–5.32)
RDW: 15.6 % (ref 11.1–15.7)
WBC: 11.6 10*3/uL — AB (ref 3.9–10.0)

## 2016-02-29 LAB — COMPREHENSIVE METABOLIC PANEL
ALT: 35 U/L (ref 0–55)
AST: 80 U/L — ABNORMAL HIGH (ref 5–34)
Albumin: 2.8 g/dL — ABNORMAL LOW (ref 3.5–5.0)
Alkaline Phosphatase: 195 U/L — ABNORMAL HIGH (ref 40–150)
Anion Gap: 6 mEq/L (ref 3–11)
BUN: 13.2 mg/dL (ref 7.0–26.0)
CALCIUM: 8.6 mg/dL (ref 8.4–10.4)
CHLORIDE: 108 meq/L (ref 98–109)
CO2: 26 meq/L (ref 22–29)
CREATININE: 0.8 mg/dL (ref 0.6–1.1)
EGFR: 72 mL/min/{1.73_m2} — ABNORMAL LOW (ref >90)
GLUCOSE: 184 mg/dL — AB (ref 70–140)
POTASSIUM: 4.1 meq/L (ref 3.5–5.1)
SODIUM: 140 meq/L (ref 136–145)
Total Bilirubin: 1.93 mg/dL — ABNORMAL HIGH (ref 0.20–1.20)
Total Protein: 5.9 g/dL — ABNORMAL LOW (ref 6.4–8.3)

## 2016-02-29 LAB — LACTATE DEHYDROGENASE: LDH: 268 U/L — AB (ref 125–245)

## 2016-02-29 LAB — MANUAL DIFFERENTIAL (CHCC SATELLITE)
ALC: 6.3 10*3/uL — ABNORMAL HIGH (ref 0.6–2.2)
ANC (CHCC MAN DIFF): 3.7 10*3/uL (ref 1.5–6.7)
BASO: 1 % (ref 0–2)
EOS: 3 % (ref 0–7)
LYMPH: 54 % — AB (ref 14–48)
MONO: 10 % (ref 0–13)
PLT EST ~~LOC~~: DECREASED
Platelet Morphology: NORMAL
SEG: 32 % — AB (ref 40–75)

## 2016-02-29 NOTE — Progress Notes (Signed)
Hematology and Oncology Follow Up Visit  Karen Richard [id number] 03-Mar-1927 82 y.o. 02/29/2016   Principle Diagnosis:  Stage A CLL. 2. 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 3. History of stage I (T1 N0 M0) ductal carcinoma of the left breast,     1999.  Current Therapy:   Observation     Interim History:  Ms.  Partin is back for followup. She is still having some leg swelling. She said that her primary care doctor increased the Lasix. I will not sure how much she really is taking.  We did a CT scan of her abdomen. I worried that she may have lymphadenopathy that might be causing some lymphatic obstruction. This CT scan did not show any evidence of lymphadenopathy. There is no splenomegaly.  She still is short of breath. She still does not have a lot of energy.  As really been no change in her medications.  She's had no fever. There's been no cough. She's had no diarrhea. Has been no obvious change in bowel or bladder habits.  She's had no bleeding or bruising.  Overall, her performance status is ECOG 1.   Medications:  Current outpatient prescriptions:  .  Acetaminophen (TYLENOL ARTHRITIS EXT RELIEF PO), Take by mouth as needed., Disp: , Rfl:  .  aspirin (ASPIRIN EC) 81 MG EC tablet, Take 81 mg by mouth every evening. , Disp: , Rfl:  .  Calcium Carbonate-Vitamin D (CALCIUM-VITAMIN D) 500-200 MG-UNIT per tablet, Take 1 tablet by mouth daily., Disp: , Rfl:  .  ergocalciferol (VITAMIN D2) 50000 UNITS capsule, Take 1 capsule (50,000 Units total) by mouth once a week., Disp: 8 capsule, Rfl: 3 .  furosemide (LASIX) 40 MG tablet, TAKE 1/2 TO 1 TABLET BY MOUTH ONCE EVERY MORNING, Disp: , Rfl: 1 .  LORazepam (ATIVAN) 0.5 MG tablet, TAKE 1 TABLET BY MOUTH EVERY DAY AS NEEDED FOR ANXIETY, Disp: 30 tablet, Rfl: 0  Allergies:  Allergies  Allergen Reactions  . Codeine Nausea Only  . Darvocet [Propoxyphene N-Acetaminophen] Nausea Only    Past Medical History,  Surgical history, Social history, and Family History were reviewed and updated.  Review of Systems: As above  Physical Exam:  height is 5' 1"  (1.549 m) and weight is 171 lb (77.565 kg). Her oral temperature is 97.9 F (36.6 C). Her blood pressure is 146/48 and her pulse is 70. Her respiration is 18.   Elderly white female in no obvious distress. Head and neck exam show no ocular or oral lesions. There is no adenopathy in the neck. Lungs are clear. Cardiac exam regular rate and rhythm with no murmurs rubs or bruits. Physical exam shows bilateral mastectomies. No chest wall nodules are noted. There is no chest wall erythema. There is no axillary adenopathy bilaterally. Abdomen is soft. Has good bowel sounds. There is no fluid wave. There is no palpable liver or spleen tip. Neck exam shows some slight kyphosis. Extremities shows some trace edema in her lower legs. She is age related osteoarthritic changes. Skin exam shows numerous seborrheic keratoses. Neurological exam is nonfocal.  Lab Results  Component Value Date   WBC 11.6* 02/29/2016   HGB 12.7 02/29/2016   HCT 36.2 02/29/2016   MCV 96 02/29/2016   PLT 90* 02/29/2016     Chemistry      Component Value Date/Time   NA 140 02/07/2016 0925   NA 140 02/08/2015 0918   NA 139 08/10/2014 1016   K 4.1  02/07/2016 0925   K 3.9 02/08/2015 0918   K 3.7 08/10/2014 1016   CL 107 02/08/2015 0918   CL 106 08/10/2014 1016   CL 109* 02/17/2013 1037   CO2 26 02/07/2016 0925   CO2 25 02/08/2015 0918   CO2 26 08/10/2014 1016   BUN 10.8 02/07/2016 0925   BUN 14 02/08/2015 0918   BUN 8 08/10/2014 1016   CREATININE 0.7 02/07/2016 0925   CREATININE 0.68 02/08/2015 0918   CREATININE 0.4* 08/10/2014 1016      Component Value Date/Time   CALCIUM 8.6 02/07/2016 0925   CALCIUM 8.6 02/08/2015 0918   CALCIUM 8.8 08/10/2014 1016   ALKPHOS 226* 02/07/2016 0925   ALKPHOS 139* 02/08/2015 0918   ALKPHOS 145* 08/10/2014 1016   AST 84* 02/07/2016 0925    AST 63* 02/08/2015 0918   AST 69* 08/10/2014 1016   ALT 39 02/07/2016 0925   ALT 32 02/08/2015 0918   ALT 39 08/10/2014 1016   BILITOT 1.81* 02/07/2016 0925   BILITOT 1.4* 02/08/2015 0918   BILITOT 1.40 08/10/2014 1016         Impression and Plan: [name] is an 82 year old white female.   From my point of view, everything looks pretty stable. The CT scan is very reassuring that there is no problems with respect to CLL.  I have to believe that the lower extremity edema is cardiac.  I did do a EKG on her. She has a left bundle branch block. She has some sinus arrhythmia.  I think she probably will need a echocardiogram area did also think that she may need to have cardiology see her.  I probably spent 40 minutes with she and her husband. I gave them my "take" on once going on. Again much much better she will be oh do get. I know she would feel better she had less fluid in her legs.  I will like to see her back in another couple months.   Volanda Napoleon, MD 5/2/201712:26 PM

## 2016-03-01 ENCOUNTER — Other Ambulatory Visit: Payer: Self-pay | Admitting: *Deleted

## 2016-03-01 ENCOUNTER — Ambulatory Visit (HOSPITAL_BASED_OUTPATIENT_CLINIC_OR_DEPARTMENT_OTHER)
Admission: RE | Admit: 2016-03-01 | Discharge: 2016-03-01 | Disposition: A | Discharge status: Home / Self Care | Payer: Medicare Other | Source: Ambulatory Visit | Attending: Hematology & Oncology | Admitting: Hematology & Oncology

## 2016-03-01 DIAGNOSIS — I517 Cardiomegaly: Secondary | ICD-10-CM | POA: Insufficient documentation

## 2016-03-01 DIAGNOSIS — C50012 Malignant neoplasm of nipple and areola, left female breast: Secondary | ICD-10-CM

## 2016-03-01 DIAGNOSIS — C50011 Malignant neoplasm of nipple and areola, right female breast: Secondary | ICD-10-CM | POA: Insufficient documentation

## 2016-03-01 DIAGNOSIS — C9110 Chronic lymphocytic leukemia of B-cell type not having achieved remission: Secondary | ICD-10-CM

## 2016-03-01 DIAGNOSIS — I340 Nonrheumatic mitral (valve) insufficiency: Secondary | ICD-10-CM | POA: Insufficient documentation

## 2016-03-01 DIAGNOSIS — E559 Vitamin D deficiency, unspecified: Secondary | ICD-10-CM

## 2016-03-01 DIAGNOSIS — R9431 Abnormal electrocardiogram [ECG] [EKG]: Secondary | ICD-10-CM | POA: Diagnosis not present

## 2016-03-01 DIAGNOSIS — I071 Rheumatic tricuspid insufficiency: Secondary | ICD-10-CM | POA: Insufficient documentation

## 2016-03-01 MED ORDER — ERGOCALCIFEROL 1.25 MG (50000 UT) PO CAPS
50000.0000 [IU] | ORAL_CAPSULE | ORAL | Status: DC
Start: 2016-03-01 — End: 2016-05-30

## 2016-03-01 NOTE — Progress Notes (Signed)
  Echocardiogram 2D Echocardiogram has been performed.  Darlina Sicilian M 03/01/2016, 11:57 AM

## 2016-03-02 ENCOUNTER — Telehealth: Payer: Self-pay | Admitting: *Deleted

## 2016-03-02 DIAGNOSIS — C50011 Malignant neoplasm of nipple and areola, right female breast: Secondary | ICD-10-CM

## 2016-03-02 DIAGNOSIS — C50012 Malignant neoplasm of nipple and areola, left female breast: Principal | ICD-10-CM

## 2016-03-02 DIAGNOSIS — C9110 Chronic lymphocytic leukemia of B-cell type not having achieved remission: Secondary | ICD-10-CM

## 2016-03-02 NOTE — Telephone Encounter (Addendum)
Patient aware of results. Consult order placed.   ----- Message from Volanda Napoleon, MD sent at 03/01/2016  5:36 PM EDT ----- Call - the echocardiogram loks ok!!!  Heart seems to be pumping well!!!  I still think that you need to see a cardiologist for the abnormal EKG that you have!!!! I am praying hard for you!!  pete

## 2016-03-31 ENCOUNTER — Ambulatory Visit (INDEPENDENT_AMBULATORY_CARE_PROVIDER_SITE_OTHER): Payer: Medicare Other | Admitting: Cardiovascular Disease

## 2016-03-31 ENCOUNTER — Encounter: Payer: Self-pay | Admitting: Cardiovascular Disease

## 2016-03-31 VITALS — BP 138/62 | HR 68 | Ht 61.0 in | Wt 175.8 lb

## 2016-03-31 DIAGNOSIS — I447 Left bundle-branch block, unspecified: Secondary | ICD-10-CM | POA: Diagnosis not present

## 2016-03-31 DIAGNOSIS — R600 Localized edema: Secondary | ICD-10-CM | POA: Diagnosis not present

## 2016-03-31 DIAGNOSIS — I890 Lymphedema, not elsewhere classified: Secondary | ICD-10-CM | POA: Diagnosis not present

## 2016-03-31 DIAGNOSIS — E785 Hyperlipidemia, unspecified: Secondary | ICD-10-CM

## 2016-03-31 NOTE — Patient Instructions (Addendum)
Medication Instructions:  Your physician recommends that you continue on your current medications as directed. Please refer to the Current Medication list given to you today.   Labwork: None Ordered   Testing/Procedures: Your physician has requested that you have a lower extremity venous duplex. This test is an ultrasound of the veins in the legs or arms. It looks at venous blood flow that carries blood from the heart to the legs or arms. Allow one hour for a Lower Venous exam. Allow thirty minutes for an Upper Venous exam. There are no restrictions or special instructions.   Follow-Up: Your physician wants you to follow-up in: 1 year with Dr. Acie Fredrickson.  You will receive a reminder letter in the mail two months in advance. If you don't receive a letter, please call our office to schedule the follow-up appointment.   If you need a refill on your cardiac medications before your next appointment, please call your pharmacy.   Thank you for choosing [facility]! Christen Bame, RN [phone]     For your  leg edema you  should do  the following 1. Leg elevation - I recommend the Lounge Dr. Leg rest.  See below for details  2. Salt restriction  -  Use potassium chloride instead of regular salt as a salt substitute. 3. Walk regularly 4. Compression hose - [facility] 5. Weight loss     Go to [email or web address]

## 2016-03-31 NOTE — Progress Notes (Signed)
Cardiology Office Note   Date:  03/31/2016   ID:  Jorden, Mahl [birth year], MRN [id number]  PCP:  Vena Austria, MD  Cardiologist:   Mertie Moores, MD   Chief Complaint  Patient presents with  . Abnormal ECG   Problem List 1. Breast Cancer 2. LBBB  3. Leg edema .  4. Chest tightness   History of Present Illness: Karen Richard is a 82 y.o. female who presents for Further evaluation of left bundle branch block. She's had an EKG which shows normal left ventricular systolic function.  There is mild diastolic dysfunction. She has mild pulmonary artery hypertension.  She has had leg swelling for the past year.  Was started on Lasix - seemed to have helped initially but the leg edema has not resolved. Has compression hose but they only go below the knee  Chest tightness - no pain ,  Occurs anytime but is expecially bad when she is walking .  Associated with DOE .   Had a chest CT scan looking for lymph nodes in the chest area but was found to have no obstruction.  LIves at [employer] .  Eats her meals there.  Seems to be relatively helath.     Past Medical History  Diagnosis Date  . Anxiety   . Arthritis   . Cancer ([place])   . Cataract   . Hyperlipidemia   . Neuromuscular disorder ([place])   . Swelling in chest     rigth breast where mastectomy was performed     Past Surgical History  Procedure Laterality Date  . Joint replacement    . Cholecystectomy    . Appendectomy    . Eye surgery    . Abdominal hysterectomy    . Breast surgery       Current Outpatient Prescriptions  Medication Sig Dispense Refill  . Acetaminophen (TYLENOL ARTHRITIS EXT RELIEF PO) Take by mouth as needed.    Marland Kitchen aspirin (ASPIRIN EC) 81 MG EC tablet Take 81 mg by mouth every evening.     . ergocalciferol (VITAMIN D2) 50000 units capsule Take 1 capsule (50,000 Units total) by mouth once a week. 12 capsule 0  . furosemide (LASIX) 40 MG tablet TAKE 1/2 TO 1 TABLET BY MOUTH ONCE  EVERY MORNING  1  . ibuprofen (ADVIL,MOTRIN) 200 MG tablet Take 200 mg by mouth every 6 (six) hours as needed for moderate pain or cramping.    Marland Kitchen LORazepam (ATIVAN) 0.5 MG tablet TAKE 1 TABLET BY MOUTH EVERY DAY AS NEEDED FOR ANXIETY 30 tablet 0   No current facility-administered medications for this visit.    Allergies:   Codeine and Darvocet    Social History:  The patient  reports that she has never smoked. She has never used smokeless tobacco. She reports that she does not drink alcohol or use illicit drugs.   Family History:  The patient's family history includes Diabetes in her father and mother; Heart attack in her father and mother.    ROS:  Please see the history of present illness.    Review of Systems: Constitutional:  denies fever, chills, diaphoresis, appetite change and fatigue.  HEENT: denies photophobia, eye pain, redness, hearing loss, ear pain, congestion, sore throat, rhinorrhea, sneezing, neck pain, neck stiffness and tinnitus.  Respiratory: denies SOB, DOE, cough, .   She has some chest tightness, and wheezing.  Cardiovascular: denies chest pain, palpitations.   She has significant  leg swelling.  Gastrointestinal: denies nausea,  vomiting, abdominal pain, diarrhea, constipation, blood in stool.  Genitourinary: denies dysuria, urgency, frequency, hematuria, flank pain and difficulty urinating.  Musculoskeletal: denies  myalgias, back pain, joint swelling, arthralgias and gait problem.   Skin: denies pallor, rash and wound.  Neurological: denies dizziness, seizures, syncope, weakness, light-headedness, numbness and headaches.   Hematological: denies adenopathy, easy bruising, personal or family bleeding history.  Psychiatric/ Behavioral: denies suicidal ideation, mood changes, confusion, nervousness, sleep disturbance and agitation.       All other systems are reviewed and negative.    PHYSICAL EXAM: VS:  BP 138/62 mmHg  Pulse 68  Ht 5' 1"  (1.549 m)  Wt 175  lb 12.8 oz (79.742 kg)  BMI 33.23 kg/m2 , BMI Body mass index is 33.23 kg/(m^2). GEN: Well nourished, well developed, in no acute distress HEENT: normal Neck: no JVD, carotid bruits, or masses Cardiac: RRR; no murmurs, rubs, or gallops.  She has bilateral lymphedema involving both legs. There is a very small amount of pitting but very insignificant. Respiratory:  clear to auscultation bilaterally, normal work of breathing GI: soft, nontender, nondistended, + BS MS: no deformity or atrophy Skin: warm and dry, no rash Neuro:  Strength and sensation are intact Psych: normal   EKG:  EKG is not ordered today. The ekg ordered 02/29/16 demonstrates normal sinus rhythm left bundle-branch block   Recent Labs: 02/29/2016: ALT 35; BUN 13.2; Creatinine 0.8; HGB 12.7; Platelets 90*; Potassium 4.1; Sodium 140    Lipid Panel    Component Value Date/Time   CHOL 171 02/06/2013 1138   TRIG 105 02/06/2013 1138   HDL 44 02/06/2013 1138   CHOLHDL 3.9 02/06/2013 1138   VLDL 21 02/06/2013 1138   LDLCALC 106* 02/06/2013 1138      Wt Readings from Last 3 Encounters:  03/31/16 175 lb 12.8 oz (79.742 kg)  02/29/16 171 lb (77.565 kg)  02/07/16 171 lb (77.565 kg)      Other studies Reviewed: Additional studies/ records that were reviewed today include: . Review of the above records demonstrates:    ASSESSMENT AND PLAN:  1.  Leg edema - lymphedema: She has a small amount of leg edema. I suspect that most of her leg swelling is due to lymphedema.  There is virtually no pitting.   Her cardiac function is normal. She has a left bundle-branch block but her left ventricle systolic function is normal. She does have mild pulmonary hypertension. She's been on Lasix and has lost some fluid but the lymphedema is persisted. She's had a CT of the abdomen and does not have any significant lymphadenopathy to suggest that she has compression of her vasculature.  We discussed we discussed conservative ways to  treat lymphedema. She needs to elevate her legs. We will have her get a lounge doctor leg rest. We will write her a prescription for thigh-high compression hose-20-30 mmHg.  We'll get an bilateral venous duplex scan to make sure that she does not have DVT.  I do not think that she needs any further cardiac workup at this time. I'll see her again in one year.  Current medicines are reviewed at length with the patient today.  The patient does not have concerns regarding medicines.  The following changes have been made:  no change  Labs/ tests ordered today include:  No orders of the defined types were placed in this encounter.    Disposition:   FU with me in 1 year     Mertie Moores, MD  03/31/2016  9:49 AM    [facility] [address] [place] [place], [place], [place]  [address] Phone: [phone]; Fax: [phone]   [facility]  [address] [place], [place]  [address] [phone]   Fax [phone]

## 2016-04-03 ENCOUNTER — Other Ambulatory Visit: Payer: Self-pay | Admitting: Cardiovascular Disease

## 2016-04-03 DIAGNOSIS — I890 Lymphedema, not elsewhere classified: Secondary | ICD-10-CM

## 2016-04-03 DIAGNOSIS — R609 Edema, unspecified: Secondary | ICD-10-CM

## 2016-04-03 DIAGNOSIS — I447 Left bundle-branch block, unspecified: Secondary | ICD-10-CM

## 2016-04-07 ENCOUNTER — Ambulatory Visit (HOSPITAL_COMMUNITY)
Admission: RE | Admit: 2016-04-07 | Discharge: 2016-04-07 | Disposition: A | Discharge status: Home / Self Care | Payer: Medicare Other | Source: Ambulatory Visit | Attending: Cardiovascular Disease | Admitting: Cardiovascular Disease

## 2016-04-07 DIAGNOSIS — F419 Anxiety disorder, unspecified: Secondary | ICD-10-CM | POA: Insufficient documentation

## 2016-04-07 DIAGNOSIS — R609 Edema, unspecified: Secondary | ICD-10-CM

## 2016-04-07 DIAGNOSIS — E785 Hyperlipidemia, unspecified: Secondary | ICD-10-CM | POA: Diagnosis not present

## 2016-04-07 DIAGNOSIS — I447 Left bundle-branch block, unspecified: Secondary | ICD-10-CM

## 2016-04-07 DIAGNOSIS — I890 Lymphedema, not elsewhere classified: Secondary | ICD-10-CM

## 2016-05-30 ENCOUNTER — Encounter: Payer: Self-pay | Admitting: Hematology & Oncology

## 2016-05-30 ENCOUNTER — Other Ambulatory Visit (HOSPITAL_BASED_OUTPATIENT_CLINIC_OR_DEPARTMENT_OTHER): Payer: Medicare Other

## 2016-05-30 ENCOUNTER — Ambulatory Visit (HOSPITAL_BASED_OUTPATIENT_CLINIC_OR_DEPARTMENT_OTHER): Payer: Medicare Other | Admitting: Hematology & Oncology

## 2016-05-30 VITALS — BP 147/49 | HR 77 | Temp 97.4°F | Resp 16 | Ht 61.0 in | Wt 173.1 lb

## 2016-05-30 DIAGNOSIS — C9110 Chronic lymphocytic leukemia of B-cell type not having achieved remission: Secondary | ICD-10-CM | POA: Diagnosis not present

## 2016-05-30 DIAGNOSIS — C50012 Malignant neoplasm of nipple and areola, left female breast: Secondary | ICD-10-CM

## 2016-05-30 DIAGNOSIS — K7460 Unspecified cirrhosis of liver: Secondary | ICD-10-CM

## 2016-05-30 DIAGNOSIS — M533 Sacrococcygeal disorders, not elsewhere classified: Secondary | ICD-10-CM | POA: Diagnosis not present

## 2016-05-30 DIAGNOSIS — R7989 Other specified abnormal findings of blood chemistry: Secondary | ICD-10-CM

## 2016-05-30 DIAGNOSIS — R945 Abnormal results of liver function studies: Principal | ICD-10-CM

## 2016-05-30 DIAGNOSIS — C50011 Malignant neoplasm of nipple and areola, right female breast: Secondary | ICD-10-CM

## 2016-05-30 DIAGNOSIS — Z853 Personal history of malignant neoplasm of breast: Secondary | ICD-10-CM

## 2016-05-30 DIAGNOSIS — E559 Vitamin D deficiency, unspecified: Secondary | ICD-10-CM

## 2016-05-30 DIAGNOSIS — R9431 Abnormal electrocardiogram [ECG] [EKG]: Secondary | ICD-10-CM

## 2016-05-30 DIAGNOSIS — G8929 Other chronic pain: Secondary | ICD-10-CM

## 2016-05-30 LAB — CBC WITH DIFFERENTIAL (CANCER CENTER ONLY)
BASO#: 0.2 10*3/uL (ref 0.0–0.2)
BASO%: 1.6 % (ref 0.0–2.0)
EOS%: 1.5 % (ref 0.0–7.0)
Eosinophils Absolute: 0.2 10*3/uL (ref 0.0–0.5)
HEMATOCRIT: 37.4 % (ref 34.8–46.6)
HEMOGLOBIN: 13.1 g/dL (ref 11.6–15.9)
LYMPH#: 8.6 10*3/uL — AB (ref 0.9–3.3)
LYMPH%: 70 % — ABNORMAL HIGH (ref 14.0–48.0)
MCH: 34.6 pg — ABNORMAL HIGH (ref 26.0–34.0)
MCHC: 35 g/dL (ref 32.0–36.0)
MCV: 99 fL (ref 81–101)
MONO#: 1 10*3/uL — AB (ref 0.1–0.9)
MONO%: 8.4 % (ref 0.0–13.0)
NEUT%: 18.5 % — AB (ref 39.6–80.0)
NEUTROS ABS: 2.3 10*3/uL (ref 1.5–6.5)
Platelets: 98 10*3/uL — ABNORMAL LOW (ref 145–400)
RBC: 3.79 10*6/uL (ref 3.70–5.32)
RDW: 15 % (ref 11.1–15.7)
WBC: 12.2 10*3/uL — ABNORMAL HIGH (ref 3.9–10.0)

## 2016-05-30 LAB — CMP (CANCER CENTER ONLY)
ALT(SGPT): 47 U/L (ref 10–47)
AST: 77 U/L — ABNORMAL HIGH (ref 11–38)
Albumin: 2.6 g/dL — ABNORMAL LOW (ref 3.3–5.5)
Alkaline Phosphatase: 187 U/L — ABNORMAL HIGH (ref 26–84)
BUN: 12 mg/dL (ref 7–22)
CHLORIDE: 106 meq/L (ref 98–108)
CO2: 30 meq/L (ref 18–33)
CREATININE: 0.7 mg/dL (ref 0.6–1.2)
Calcium: 8.5 mg/dL (ref 8.0–10.3)
GLUCOSE: 222 mg/dL — AB (ref 73–118)
Potassium: 4.4 mEq/L (ref 3.3–4.7)
Sodium: 135 mEq/L (ref 128–145)
TOTAL PROTEIN: 5.7 g/dL — AB (ref 6.4–8.1)
Total Bilirubin: 2 mg/dl — ABNORMAL HIGH (ref 0.20–1.60)

## 2016-05-30 LAB — TECHNOLOGIST REVIEW CHCC SATELLITE

## 2016-05-30 MED ORDER — ERGOCALCIFEROL 1.25 MG (50000 UT) PO CAPS: 50000.0000 [IU] | ORAL_CAPSULE | ORAL | 3 refills | Status: DC

## 2016-05-30 MED ORDER — TRAMADOL HCL 50 MG PO TABS: 50.0000 mg | ORAL_TABLET | Freq: Four times a day (QID) | ORAL | 1 refills | Status: DC | PRN

## 2016-05-30 MED FILL — VIT D2 1.25 MG (50,000 UNIT: 1.25 MG | 84 days supply | Qty: 12 | Fill #0

## 2016-05-30 MED FILL — traMADol HCL 50 MG TABS: 50 | 15 days supply | Qty: 60 | Fill #0

## 2016-05-30 NOTE — Progress Notes (Signed)
Hematology and Oncology Follow Up Visit  Karen Richard [id number] Dec 18, 1926 82 y.o. 05/30/2016   Principle Diagnosis:  Stage A CLL. 2. 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 3. History of stage I (T1 N0 M0) ductal carcinoma of the left breast,     1999.  Current Therapy:   Observation     Interim History:  Karen Richard is back for followup. Her problem now that she is having some pain in the right sacroiliac region. This is begun on for a couple weeks. It started after she was having pain in the right shoulder. Right shoulder pain improved but then she had this pain in the right sacroiliac region.  She does see a orthopedist. We will have to give him a call to see if his office can see her for this. I would not think this is anything for breast cancer but, I think we have to look it this region with a bone scan.  She has old multiple other issues. Thankfully, none of this has anything do with her malignancy. She still has leg swelling. I think she is on Lasix. She is not having any bowel or bladder issues. There is no abdominal pain.  She has had no weight loss or weight gain. She has numerous seborrheic keratoses.  She's had no cough. There's been no mouth sores.  Both she and her husband are just frustrated with her not be able to get around because of this right sacroiliac issue.  Overall, her performance status is ECOG 1.   Medications:  Current Outpatient Prescriptions:  .  Acetaminophen (TYLENOL ARTHRITIS EXT RELIEF PO), Take by mouth as needed., Disp: , Rfl:  .  aspirin (ASPIRIN EC) 81 MG EC tablet, Take 81 mg by mouth every evening. , Disp: , Rfl:  .  ergocalciferol (VITAMIN D2) 50000 units capsule, Take 1 capsule (50,000 Units total) by mouth once a week., Disp: 12 capsule, Rfl: 3 .  furosemide (LASIX) 40 MG tablet, TAKE 1/2 TO 1 TABLET BY MOUTH ONCE EVERY MORNING, Disp: , Rfl: 1 .  ibuprofen (ADVIL,MOTRIN) 200 MG tablet, Take 200 mg by mouth every 6  (six) hours as needed for moderate pain or cramping., Disp: , Rfl:  .  LORazepam (ATIVAN) 0.5 MG tablet, TAKE 1 TABLET BY MOUTH EVERY DAY AS NEEDED FOR ANXIETY, Disp: 30 tablet, Rfl: 0 .  traMADol (ULTRAM) 50 MG tablet, Take 1 tablet (50 mg total) by mouth every 6 (six) hours as needed., Disp: 60 tablet, Rfl: 1  Allergies:  Allergies  Allergen Reactions  . Codeine Nausea Only  . Darvocet [Propoxyphene N-Acetaminophen] Nausea Only    Past Medical History, Surgical history, Social history, and Family History were reviewed and updated.  Review of Systems: As above  Physical Exam:  height is 5' 1"  (1.549 m) and weight is 173 lb 1.3 oz (78.5 kg). Her oral temperature is 97.4 F (36.3 C). Her blood pressure is 147/49 (abnormal) and her pulse is 77. Her respiration is 16.   Elderly white female in no obvious distress. Head and neck exam show no ocular or oral lesions. There is no adenopathy in the neck. Lungs are clear. Cardiac exam regular rate and rhythm with no murmurs rubs or bruits. Physical exam shows bilateral mastectomies. No chest wall nodules are noted. There is no chest wall erythema. There is no axillary adenopathy bilaterally. Abdomen is soft. Has good bowel sounds. There is no fluid wave. There is no palpable  liver or spleen tip. Back exam shows some slight kyphosis. She has some point tenderness in the right sacroiliac area. Extremities shows some trace edema in her lower legs. She is age related osteoarthritic changes. Skin exam shows numerous seborrheic keratoses. Neurological exam is nonfocal.  Lab Results  Component Value Date   WBC 12.2 (H) 05/30/2016   HGB 13.1 05/30/2016   HCT 37.4 05/30/2016   MCV 99 05/30/2016   PLT 98 (L) 05/30/2016     Chemistry      Component Value Date/Time   NA 135 05/30/2016 0847   NA 140 02/29/2016 0935   K 4.4 05/30/2016 0847   K 4.1 02/29/2016 0935   CL 106 05/30/2016 0847   CL 109 (H) 02/17/2013 1037   CO2 30 05/30/2016 0847   CO2 26  02/29/2016 0935   BUN 12 05/30/2016 0847   BUN 13.2 02/29/2016 0935   CREATININE 0.7 05/30/2016 0847   CREATININE 0.8 02/29/2016 0935      Component Value Date/Time   CALCIUM 8.5 05/30/2016 0847   CALCIUM 8.6 02/29/2016 0935   ALKPHOS 187 (H) 05/30/2016 0847   ALKPHOS 195 (H) 02/29/2016 0935   AST 77 (H) 05/30/2016 0847   AST 80 (H) 02/29/2016 0935   ALT 47 05/30/2016 0847   ALT 35 02/29/2016 0935   BILITOT 2.00 (H) 05/30/2016 0847   BILITOT 1.93 (H) 02/29/2016 0935         Impression and Plan: Karen Richard is an 82 year old white female. Fortunately, I think her problems are not related to anything cancer - wise or her CLL. I'm not sure why she has this pain in the right sacroiliac area. She needs to see her orthopedic doctor. I will call him about this. I will get a bone scan on her just to make sure that there is nothing suspicious.  I think she probably has diabetes. Her blood sugar is 222. Her family doctor, Dr. Alyson Ingles, will be informed about this.  Her liver function tests are more elevated. She has been evaluated for this before. She does have cirrhosis. I suspect this is NASH. This might be getting worse. Again, this is somewhat her family doctor can manage and refer to gastroenterology.  I would like to see her back in 6 weeks.  I did give her prescription for Ultram to try to help with the discomfort.   Volanda Napoleon, MD 8/1/20171:21 PM

## 2016-05-31 LAB — RETICULOCYTES: Reticulocyte Count: 1.6 % (ref 0.6–2.6)

## 2016-06-06 ENCOUNTER — Ambulatory Visit (HOSPITAL_COMMUNITY)
Admission: RE | Admit: 2016-06-06 | Discharge: 2016-06-06 | Disposition: A | Discharge status: Home / Self Care | Payer: Medicare Other | Source: Ambulatory Visit | Attending: Hematology & Oncology | Admitting: Hematology & Oncology

## 2016-06-06 ENCOUNTER — Encounter (HOSPITAL_COMMUNITY)
Admission: RE | Admit: 2016-06-06 | Discharge: 2016-06-06 | Disposition: A | Discharge status: Home / Self Care | Payer: Medicare Other | Source: Ambulatory Visit | Attending: Hematology & Oncology | Admitting: Hematology & Oncology

## 2016-06-06 DIAGNOSIS — R161 Splenomegaly, not elsewhere classified: Secondary | ICD-10-CM | POA: Insufficient documentation

## 2016-06-06 DIAGNOSIS — R7989 Other specified abnormal findings of blood chemistry: Secondary | ICD-10-CM | POA: Diagnosis present

## 2016-06-06 DIAGNOSIS — N281 Cyst of kidney, acquired: Secondary | ICD-10-CM | POA: Insufficient documentation

## 2016-06-06 DIAGNOSIS — E559 Vitamin D deficiency, unspecified: Secondary | ICD-10-CM | POA: Diagnosis not present

## 2016-06-06 DIAGNOSIS — G8929 Other chronic pain: Secondary | ICD-10-CM | POA: Diagnosis not present

## 2016-06-06 DIAGNOSIS — Z96653 Presence of artificial knee joint, bilateral: Secondary | ICD-10-CM | POA: Insufficient documentation

## 2016-06-06 DIAGNOSIS — M533 Sacrococcygeal disorders, not elsewhere classified: Secondary | ICD-10-CM

## 2016-06-06 DIAGNOSIS — Z9049 Acquired absence of other specified parts of digestive tract: Secondary | ICD-10-CM | POA: Insufficient documentation

## 2016-06-06 DIAGNOSIS — R945 Abnormal results of liver function studies: Principal | ICD-10-CM

## 2016-06-06 DIAGNOSIS — R938 Abnormal findings on diagnostic imaging of other specified body structures: Secondary | ICD-10-CM | POA: Diagnosis not present

## 2016-06-06 MED ORDER — TECHNETIUM TC 99M MEDRONATE IV KIT
25.0000 | PACK | Freq: Once | INTRAVENOUS | Status: AC | PRN
  Administered 2016-06-06: 25.4 via INTRAVENOUS

## 2016-07-07 ENCOUNTER — Other Ambulatory Visit: Payer: Self-pay

## 2016-07-07 DIAGNOSIS — C9110 Chronic lymphocytic leukemia of B-cell type not having achieved remission: Secondary | ICD-10-CM

## 2016-07-10 ENCOUNTER — Ambulatory Visit (HOSPITAL_BASED_OUTPATIENT_CLINIC_OR_DEPARTMENT_OTHER): Payer: Medicare Other | Admitting: Hematology & Oncology

## 2016-07-10 ENCOUNTER — Other Ambulatory Visit (HOSPITAL_BASED_OUTPATIENT_CLINIC_OR_DEPARTMENT_OTHER): Payer: Medicare Other

## 2016-07-10 ENCOUNTER — Encounter: Payer: Self-pay | Admitting: Hematology & Oncology

## 2016-07-10 VITALS — BP 156/50 | HR 77 | Temp 97.7°F | Resp 18 | Ht 61.0 in | Wt 174.0 lb

## 2016-07-10 DIAGNOSIS — K7460 Unspecified cirrhosis of liver: Secondary | ICD-10-CM | POA: Diagnosis not present

## 2016-07-10 DIAGNOSIS — C9110 Chronic lymphocytic leukemia of B-cell type not having achieved remission: Secondary | ICD-10-CM

## 2016-07-10 DIAGNOSIS — I972 Postmastectomy lymphedema syndrome: Secondary | ICD-10-CM

## 2016-07-10 DIAGNOSIS — C50919 Malignant neoplasm of unspecified site of unspecified female breast: Secondary | ICD-10-CM | POA: Diagnosis not present

## 2016-07-10 DIAGNOSIS — I890 Lymphedema, not elsewhere classified: Secondary | ICD-10-CM

## 2016-07-10 LAB — COMPREHENSIVE METABOLIC PANEL
ALK PHOS: 231 U/L — AB (ref 40–150)
ALT: 41 U/L (ref 0–55)
ANION GAP: 6 meq/L (ref 3–11)
AST: 89 U/L — ABNORMAL HIGH (ref 5–34)
Albumin: 2.7 g/dL — ABNORMAL LOW (ref 3.5–5.0)
BUN: 10.2 mg/dL (ref 7.0–26.0)
CALCIUM: 8.9 mg/dL (ref 8.4–10.4)
CHLORIDE: 109 meq/L (ref 98–109)
CO2: 24 mEq/L (ref 22–29)
CREATININE: 0.7 mg/dL (ref 0.6–1.1)
EGFR: 77 mL/min/{1.73_m2} — ABNORMAL LOW (ref >90)
Glucose: 117 mg/dl (ref 70–140)
POTASSIUM: 4.3 meq/L (ref 3.5–5.1)
Sodium: 139 mEq/L (ref 136–145)
Total Bilirubin: 1.9 mg/dL — ABNORMAL HIGH (ref 0.20–1.20)
Total Protein: 6 g/dL — ABNORMAL LOW (ref 6.4–8.3)

## 2016-07-10 LAB — MANUAL DIFFERENTIAL (CHCC SATELLITE)
ALC: 6.8 10*3/uL — ABNORMAL HIGH (ref 0.6–2.2)
ANC (CHCC MAN DIFF): 3.4 10*3/uL (ref 1.5–6.7)
BASO: 1 % (ref 0–2)
EOS: 1 % (ref 0–7)
LYMPH: 61 % — ABNORMAL HIGH (ref 14–48)
MONO: 7 % (ref 0–13)
PLT EST ~~LOC~~: DECREASED
SEG: 30 % — ABNORMAL LOW (ref 40–75)

## 2016-07-10 LAB — CBC WITH DIFFERENTIAL (CANCER CENTER ONLY)
HCT: 36.5 % (ref 34.8–46.6)
HGB: 12.9 g/dL (ref 11.6–15.9)
MCH: 34.6 pg — AB (ref 26.0–34.0)
MCHC: 35.3 g/dL (ref 32.0–36.0)
MCV: 98 fL (ref 81–101)
PLATELETS: 89 10*3/uL — AB (ref 145–400)
RBC: 3.73 10*6/uL (ref 3.70–5.32)
RDW: 15.2 % (ref 11.1–15.7)
WBC: 11.2 10*3/uL — ABNORMAL HIGH (ref 3.9–10.0)

## 2016-07-10 LAB — CHCC SATELLITE - SMEAR

## 2016-07-10 LAB — LACTATE DEHYDROGENASE: LDH: 293 U/L — AB (ref 125–245)

## 2016-07-10 NOTE — Progress Notes (Signed)
Hematology and Oncology Follow Up Visit  Karen Richard [id number] [birth year] 82 y.o. 07/10/2016   Principle Diagnosis:  Stage A CLL. 2. 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 3. History of stage I (T1 N0 M0) ductal carcinoma of the left breast,     1999.  Current Therapy:   Observation     Interim History:  Karen Richard is back for followup. She is feeling better. She is not complaining of any hip pain. A more last saw her, we did go ahead and do a bone scan on her. Bone scan shows some uptake in the right sacroiliac region. Of course, the radiologist cannot discount malignancy. He recommended an MRI. Karen Richard does not want to have any other test done right now.  She has the swelling in her legs. We did do an ultrasound of her abdomen. She has some splenomegaly by ultrasound criteria. She also has cirrhosis. I suspect that she has NASH.  She is complaining of some pain and swelling with the right arm. I think that may be a lymphedema clinic might help her out.  Overall, she sees be doing better than last time we saw her.  Overall, her performance status is ECOG 1.   Medications:  Current Outpatient Prescriptions:  .  Acetaminophen (TYLENOL ARTHRITIS EXT RELIEF PO), Take by mouth as needed., Disp: , Rfl:  .  aspirin (ASPIRIN EC) 81 MG EC tablet, Take 81 mg by mouth every evening. , Disp: , Rfl:  .  ergocalciferol (VITAMIN D2) 50000 units capsule, Take 1 capsule (50,000 Units total) by mouth once a week., Disp: 12 capsule, Rfl: 3 .  furosemide (LASIX) 40 MG tablet, TAKE 1/2 TO 1 TABLET BY MOUTH ONCE EVERY MORNING, Disp: , Rfl: 1 .  ibuprofen (ADVIL,MOTRIN) 200 MG tablet, Take 200 mg by mouth every 6 (six) hours as needed for moderate pain or cramping., Disp: , Rfl:  .  LORazepam (ATIVAN) 0.5 MG tablet, TAKE 1 TABLET BY MOUTH EVERY DAY AS NEEDED FOR ANXIETY, Disp: 30 tablet, Rfl: 0  Allergies:  Allergies  Allergen Reactions  . Codeine Nausea Only  .  Darvocet [Propoxyphene N-Acetaminophen] Nausea Only    Past Medical History, Surgical history, Social history, and Family History were reviewed and updated.  Review of Systems: As above  Physical Exam:  height is 5' 1"  (1.549 m) and weight is 174 lb (78.9 kg). Her oral temperature is 97.7 F (36.5 C). Her blood pressure is 156/50 (abnormal) and her pulse is 77. Her respiration is 18.   Elderly white female in no obvious distress. Head and neck exam show no ocular or oral lesions. There is no adenopathy in the neck. Lungs are clear. Cardiac exam regular rate and rhythm with no murmurs rubs or bruits. Physical exam shows bilateral mastectomies. No chest wall nodules are noted. There is no chest wall erythema. There is no axillary adenopathy bilaterally. Abdomen is soft. Has good bowel sounds. There is no fluid wave. There is no palpable liver or spleen tip. Back exam shows some slight kyphosis. She has some point tenderness in the right sacroiliac area. Extremities shows some trace edema in her lower legs. She is age related osteoarthritic changes. Skin exam shows numerous seborrheic keratoses. Neurological exam is nonfocal.  Lab Results  Component Value Date   WBC 11.2 (H) 07/10/2016   HGB 12.9 07/10/2016   HCT 36.5 07/10/2016   MCV 98 07/10/2016   PLT 89 (L) 07/10/2016  Chemistry      Component Value Date/Time   NA 135 05/30/2016 0847   NA 140 02/29/2016 0935   K 4.4 05/30/2016 0847   K 4.1 02/29/2016 0935   CL 106 05/30/2016 0847   CL 109 (H) 02/17/2013 1037   CO2 30 05/30/2016 0847   CO2 26 02/29/2016 0935   BUN 12 05/30/2016 0847   BUN 13.2 02/29/2016 0935   CREATININE 0.7 05/30/2016 0847   CREATININE 0.8 02/29/2016 0935      Component Value Date/Time   CALCIUM 8.5 05/30/2016 0847   CALCIUM 8.6 02/29/2016 0935   ALKPHOS 187 (H) 05/30/2016 0847   ALKPHOS 195 (H) 02/29/2016 0935   AST 77 (H) 05/30/2016 0847   AST 80 (H) 02/29/2016 0935   ALT 47 05/30/2016 0847   ALT  35 02/29/2016 0935   BILITOT 2.00 (H) 05/30/2016 0847   BILITOT 1.93 (H) 02/29/2016 0935         Impression and Plan: Karen Richard is an 82 year old white female. Fortunately, I think her problems are not related to anything cancer - wise or her CLL.   I taught her about the bone scan. Of course, the radiologist thought that we probably needed an MRI. I talked to Karen Richard about an MRI. Because she has no pain, she does not want to do any MRIs.  Her main complaint has been some lymphedema issues. We will see about referring her to lymphedema clinic.  She does have cirrhosis. The ultrasound confirms this. She has seen Dr. Earle Gell in the past for gastroenterology issues. She will call his office for an appointment. I'm not sure what can be done for the cirrhosis. Looks like NASH.   From my point, I think we'll probably get her back in 2-3 months.  I just would not think that recurrent breast cancer would be a problem for her. Of course, this is always a possibility although a unlikely possibility.       Volanda Napoleon, MD 9/11/201712:50 PM

## 2016-07-12 ENCOUNTER — Ambulatory Visit: Payer: Medicare Other

## 2016-07-13 ENCOUNTER — Ambulatory Visit: Payer: Medicare Other | Attending: Hematology & Oncology | Admitting: Physical Therapy

## 2016-07-13 DIAGNOSIS — I972 Postmastectomy lymphedema syndrome: Secondary | ICD-10-CM

## 2016-07-13 NOTE — Therapy (Addendum)
[place], [place], [address] Phone: [phone]   Fax:  [phone]  Physical Therapy Evaluation  Patient Details  Name: Karen Richard MRN: [id number] Date of Birth: [birth year] Referring Provider: Dr. Burney Gauze  Encounter Date: 07/13/2016      PT End of Session - 07/13/16 0849    Visit Number 1   Number of Visits 12   Date for PT Re-Evaluation 08/10/16   PT Start Time 0758   PT Stop Time 0848   PT Time Calculation (min) 50 min   Activity Tolerance Patient tolerated treatment well   Behavior During Therapy [facility] for tasks assessed/performed      Past Medical History:  Diagnosis Date  . Anxiety   . Arthritis   . Cancer ([place])   . Cataract   . Hyperlipidemia   . Neuromuscular disorder ([place])   . Swelling in chest    rigth breast where mastectomy was performed     Past Surgical History:  Procedure Laterality Date  . ABDOMINAL HYSTERECTOMY    . APPENDECTOMY    . BREAST SURGERY    . CHOLECYSTECTOMY    . EYE SURGERY    . JOINT REPLACEMENT      There were no vitals filed for this visit.       Subjective Assessment - 07/13/16 0808    Subjective Patient reports her right arm swelling began 2-3 months ago.  She also reports bilateral leg swelling and reports she has seen her PCP, her orthopedist who did bilateral TKR in 2005, her cardiologist, and her oncologist and they are unsure of the cause of leg swelling.  She is scheduled to see a gastroenterologist this afternoon.   Pertinent History Patient underwent a right mastectomy about 7 years ago and her left mastectomy in 1999.  She had bilateral total knee replacements in 2005 six months apart.  She underwent a total hysterectomy 1985 and had her gallbladder removed.  She had no chemotherapy or radiation when she had cancer but took Tamoxifen for 5 yuears after her left breast cancer and took another anti-estrogen pill for 5 years after  her right breast cancer.   Patient Stated Goals Reduce right arm swelling   Currently in Pain? No/denies            [facility] PT Assessment - 07/13/16 0001      Assessment   Medical Diagnosis Right arm lymphedema   Referring Provider Dr. Burney Gauze   Onset Date/Surgical Date 03/30/16   Hand Dominance Right   Next MD Visit 10/10/2016   Prior Therapy none     Precautions   Precautions Other (comment)   Precaution Comments history bilateral breast cancer     Restrictions   Weight Bearing Restrictions No     Balance Screen   Has the patient fallen in the past 6 months No   Has the patient had a decrease in activity level because of a fear of falling?  No   Is the patient reluctant to leave their home because of a fear of falling?  No     Home Environment   Living Environment Assisted living     Prior Function   Level of Independence Independent   Vocation Retired   Leisure She does not exercise     Cognition   Overall Cognitive Status Within Functional Limits for tasks assessed     Observation/Other Assessments   Observations Lymphedema Life Impact Scale - scored 14 with  functional impairment of 21%.     Posture/Postural Control   Posture/Postural Control Postural limitations   Postural Limitations Rounded Shoulders;Forward head     ROM / Strength   AROM / PROM / Strength AROM;Strength     AROM   Overall AROM  Within functional limits for tasks performed     Strength   Overall Strength Within functional limits for tasks performed   Overall Strength Comments Bilateral shoulders are WNL           LYMPHEDEMA/ONCOLOGY QUESTIONNAIRE - 07/13/16 0822      Type   Cancer Type Bilateral breast cancer history     Surgeries   Mastectomy Date 12/07/08  Left mastectomy 1999   Sentinel Lymph Node Biopsy Date 12/07/08  SLNB on right side 2010   Axillary Lymph Node Dissection Date 09/29/98  ALND on left 1999     Treatment   Active Chemotherapy Treatment No    Past Chemotherapy Treatment No   Active Radiation Treatment No   Past Radiation Treatment No   Current Hormone Treatment No   Past Hormone Therapy Yes   Date --  Took for 5 years after each breast cancer   Drug Name Tamoxifen then Femara     What other symptoms do you have   Are you Having Heaviness or Tightness Yes   Are you having Pain No   Are you having pitting edema No   Is it Hard or Difficult finding clothes that fit No   Do you have infections No   Is there Decreased scar mobility No   Stemmer Sign No   Other Symptoms n     Lymphedema Stage   Stage STAGE 1 SPONTANEOUSLY REVERSIBLE     Lymphedema Assessments   Lymphedema Assessments Upper extremities     Right Upper Extremity Lymphedema   15 cm Proximal to Olecranon Process 33.3 cm   10 cm Proximal to Olecranon Process 32.1 cm   Olecranon Process 24.5 cm   15 cm Proximal to Ulnar Styloid Process 23.1 cm   10 cm Proximal to Ulnar Styloid Process 22 cm   Just Proximal to Ulnar Styloid Process 18.8 cm   Across Hand at [employer] 19.4 cm   At [place] of 2nd Digit 6.3 cm     Left Upper Extremity Lymphedema   15 cm Proximal to Olecranon Process 34.5 cm   10 cm Proximal to Olecranon Process 32.2 cm   Olecranon Process 26.5 cm   15 cm Proximal to Ulnar Styloid Process 26.2 cm   10 cm Proximal to Ulnar Styloid Process 24 cm   Just Proximal to Ulnar Styloid Process 20.3 cm   Across Hand at [employer] 18.7 cm   At [place] of 2nd Digit 6.3 cm                                Long Term Clinic Goals - 07/13/16 1116      CC Long Term Goal  #1   Title Patient will verbalize understanding of treatment of lymphedema and long term maintenance of her swelling.   Time 4   Period Weeks   Status New     CC Long Term Goal  #2   Title Reduce left upper extremity to </= 23 cm at 10 cm proximal to her ulnar styloid process.   Time 4   Period Weeks   Status New  CC Long Term Goal  #3   Title  Reduce left upper extremity to </= 31 cm at 10 cm proximal to olecranon process.   Time 4   Period Weeks   Status New     CC Long Term Goal  #4   Title Patient will report >/= 25% improvement in right arm swelling throughout the day.     CC Long Term Goal  #5   Title Patient will verbalize where and how to be fitted for compression sleeves and gauntlets for bilateral upper extremities.   Time 4   Period Weeks   Status New            Plan - 07/13/16 1103    Clinical Impression Statement Patient is a very pleasant woman who has likely had chronic left upper extremity lymphedema for many years following her mastectomy and ALND in 1999. She reports more recent right arm swelling over the past 2-3 months rfom a mastectomy and SLNB in 2010.  She also has concerns about bilateral leg swelling but states taht is being worked up by [employer] and has an appointment today with a gastroenterologist.  She would benefit from PT to address her bilateral arm lymphedema with manual lymph drainage and compression garments.  Depending on the cause of her leg swelling, she may also benefit from physical therapy to reduce leg swelling after being cleared by her GI doctor.  Due to the complexity of her situation and other medical comorbidities complicating treatment decisions, her eval is of moderate complexity.   Rehab Potential Good   Clinical Impairments Affecting Rehab Potential Chronic condition   PT Frequency 2x / week   PT Duration 4 weeks   PT Treatment/Interventions ADLs/Self Care Home Management;DME Instruction;Therapeutic exercise;Manual lymph drainage;Manual techniques;Patient/family education;Compression bandaging;Vasopneumatic Device   PT Next Visit Plan Begin manual lymph drainage for bilateral upper extremities redirecting to abdomen and inguinal region; assess legs for lymphedema depending on her gastroenterologist visit results.   Consulted and Agree with Plan of Care Patient;Family  member/caregiver   Family Member Consulted husband      Patient will benefit from skilled therapeutic intervention in order to improve the following deficits and impairments:  Increased edema, Postural dysfunction, Decreased knowledge of use of DME  Visit Diagnosis: Postmastectomy lymphedema - Plan: PT plan of care cert/re-cert      G-Codes - 83/09/40 1117    Functional Assessment Tool Used Lymphedema Life Impact Scale   Functional Limitation Other PT primary   Other PT Primary Current Status (H6808) At least 20 percent but less than 40 percent impaired, limited or restricted   Other PT Primary Goal Status (U1103) At least 1 percent but less than 20 percent impaired, limited or restricted       Problem List Patient Active Problem List   Diagnosis Date Noted  . LBBB (left bundle branch block) 03/31/2016  . Lymphedema 03/31/2016  . Peripheral edema 02/06/2013  . Back pain, thoracic 05/14/2012  . CLL (chronic lymphocytic leukemia) ([place]) 02/19/2012  . Urinary frequency 01/11/2011  . RUQ abdominal pain 01/11/2011  . ARTHRITIS, CERVICAL SPINE 06/22/2010  . DISTURBANCE OF SKIN SENSATION 06/22/2010  . HYPOGLYCEMIA, UNSPECIFIED 02/08/2010  . IRREGULAR HEART RATE 02/01/2010  . ABNORMAL INVOLUNTARY MOVEMENTS 02/01/2010  . CHEST DISCOMFORT 02/01/2010  . INSOMNIA 07/22/2009  . OTHER MALAISE AND FATIGUE 07/22/2009  . HEAT INTOLERANCE 07/22/2009  . VITAMIN D DEFICIENCY 02/11/2009  . Malignant neoplasm of female breast ([place]) 12/04/2008  . ANXIETY, SITUATIONAL 07/22/2008  .  NOCTURIA 07/22/2008  . Elevated blood pressure reading without diagnosis of hypertension 07/22/2008  . SYMPTOM, COUGH 03/11/2007  . ONYCHOMYCOSIS 12/27/2006  . HYPERLIPIDEMIA 12/27/2006  . RESTLESS LEGS SYNDROME 12/27/2006  . SEBORRHEIC KERATOSIS, NOS 12/27/2006  . DJD, UNSPECIFIED 12/27/2006  . Candiss Norse SITES 12/27/2006    Annia Friendly, PT 07/13/16 11:23 AM  [place], [place], [address] Phone: [phone]   Fax:  [phone]  Name: DAWSYN ZURN MRN: [id number] Date of Birth: [birth year]   PHYSICAL THERAPY DISCHARGE SUMMARY  Visits from Start of Care: 1  Current functional level related to goals / functional outcomes: Unknown.  Patient had planned to see a gastroenterologist after her PT evaluation and let us know if we can begin treatment.  We did not hear back from her and have been unable to reach her.   Remaining deficits: Unknown   Education / Equipment: None  Plan: Patient agrees to discharge.  Patient goals were not met. Patient is being discharged due to not returning since the last visit.  ?????  I would be happy to resume treatment with her if she would like.  Thanks for your referral. Annia Friendly, PT 09/25/16 1:50 PM

## 2016-09-04 MED FILL — VIT D2 1.25 MG (50,000 UNIT: 1.25 MG | 84 days supply | Qty: 12 | Fill #1

## 2016-10-10 ENCOUNTER — Ambulatory Visit (HOSPITAL_BASED_OUTPATIENT_CLINIC_OR_DEPARTMENT_OTHER): Payer: Medicare Other | Admitting: Hematology & Oncology

## 2016-10-10 ENCOUNTER — Other Ambulatory Visit (HOSPITAL_BASED_OUTPATIENT_CLINIC_OR_DEPARTMENT_OTHER): Payer: Medicare Other

## 2016-10-10 VITALS — BP 140/60 | HR 68 | Temp 98.2°F | Resp 16 | Wt 176.0 lb

## 2016-10-10 DIAGNOSIS — R161 Splenomegaly, not elsewhere classified: Secondary | ICD-10-CM

## 2016-10-10 DIAGNOSIS — I972 Postmastectomy lymphedema syndrome: Secondary | ICD-10-CM

## 2016-10-10 DIAGNOSIS — K743 Primary biliary cirrhosis: Secondary | ICD-10-CM

## 2016-10-10 DIAGNOSIS — K760 Fatty (change of) liver, not elsewhere classified: Secondary | ICD-10-CM | POA: Diagnosis not present

## 2016-10-10 DIAGNOSIS — K7460 Unspecified cirrhosis of liver: Secondary | ICD-10-CM | POA: Diagnosis not present

## 2016-10-10 DIAGNOSIS — C9110 Chronic lymphocytic leukemia of B-cell type not having achieved remission: Secondary | ICD-10-CM

## 2016-10-10 LAB — CBC WITH DIFFERENTIAL (CANCER CENTER ONLY)
HCT: 34.8 % (ref 34.8–46.6)
HEMOGLOBIN: 12.5 g/dL (ref 11.6–15.9)
MCH: 34.3 pg — AB (ref 26.0–34.0)
MCHC: 35.9 g/dL (ref 32.0–36.0)
MCV: 96 fL (ref 81–101)
PLATELETS: 82 10*3/uL — AB (ref 145–400)
RBC: 3.64 10*6/uL — AB (ref 3.70–5.32)
RDW: 15.5 % (ref 11.1–15.7)
WBC: 8.8 10*3/uL (ref 3.9–10.0)

## 2016-10-10 LAB — CMP (CANCER CENTER ONLY)
ALBUMIN: 2.7 g/dL — AB (ref 3.3–5.5)
ALK PHOS: 174 U/L — AB (ref 26–84)
ALT: 32 U/L (ref 10–47)
AST: 75 U/L — ABNORMAL HIGH (ref 11–38)
BUN: 13 mg/dL (ref 7–22)
CHLORIDE: 107 meq/L (ref 98–108)
CO2: 26 mEq/L (ref 18–33)
CREATININE: 0.7 mg/dL (ref 0.6–1.2)
Calcium: 8.6 mg/dL (ref 8.0–10.3)
Glucose, Bld: 187 mg/dL — ABNORMAL HIGH (ref 73–118)
Potassium: 4.1 mEq/L (ref 3.3–4.7)
SODIUM: 137 meq/L (ref 128–145)
TOTAL PROTEIN: 5.6 g/dL — AB (ref 6.4–8.1)
Total Bilirubin: 1.9 mg/dl — ABNORMAL HIGH (ref 0.20–1.60)

## 2016-10-10 LAB — MANUAL DIFFERENTIAL (CHCC SATELLITE)
ALC: 6 10*3/uL — AB (ref 0.6–2.2)
ANC (CHCC MAN DIFF): 2.5 10*3/uL (ref 1.5–6.7)
BAND NEUTROPHILS: 0 % (ref 0–10)
BASO: 0 % (ref 0–2)
BLASTS: 0 % (ref 0–0)
Eos: 1 % (ref 0–7)
LYMPH: 68 % — ABNORMAL HIGH (ref 14–48)
MONO: 3 % (ref 0–13)
MYELOCYTES: 0 % (ref 0–0)
Metamyelocytes: 0 % (ref 0–0)
OTHER CELLS: 0 % (ref 0–0)
Other Comments: 0
PLT EST ~~LOC~~: DECREASED
PROMYELO: 0 % (ref 0–0)
SEG: 28 % — ABNORMAL LOW (ref 40–75)
VARIANT LYMPH: 0 % (ref 0–0)
nRBC: 0 % (ref 0–0)

## 2016-10-10 NOTE — Progress Notes (Signed)
Hematology and Oncology Follow Up Visit  Karen Richard [id number] [birth year] 82 y.o. 10/10/2016   Principle Diagnosis:  Stage A CLL. 2. 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 3. History of stage I (T1 N0 M0) ductal carcinoma of the left breast,     1999. 4. Cirrhosis due to steatohepatitis  Current Therapy:   Observation     Interim History:  Ms.  Richard is back for followup. She is feeling better. From my point of view, everything is looking relatively stable.  She does have some cirrhosis. This is from a fatty liver. She is being followed by gastroenterology for this. She says she is getting hepatitis shots. I suppose this is to try to prevent hepatitis.  She also sees a cardiologist. She also sees a dermatologist.  From all of her studies that she had done this past year, nothing has shown that there is any issue with breast cancer or CLL.  She's had no fever. She's had no bleeding. She's had no change in bowel or bladder habits.   Overall, her performance status is ECOG 1.   Medications:  Current Outpatient Prescriptions:  .  Acetaminophen (TYLENOL ARTHRITIS EXT RELIEF PO), Take by mouth as needed., Disp: , Rfl:  .  aspirin (ASPIRIN EC) 81 MG EC tablet, Take 81 mg by mouth every evening. , Disp: , Rfl:  .  ergocalciferol (VITAMIN D2) 50000 units capsule, Take 1 capsule (50,000 Units total) by mouth once a week., Disp: 12 capsule, Rfl: 3 .  furosemide (LASIX) 20 MG tablet, Take 20 mg by mouth daily., Disp: , Rfl:  .  LORazepam (ATIVAN) 0.5 MG tablet, TAKE 1 TABLET BY MOUTH EVERY DAY AS NEEDED FOR ANXIETY, Disp: 30 tablet, Rfl: 0  Allergies:  Allergies  Allergen Reactions  . Codeine Nausea Only  . Darvocet [Propoxyphene N-Acetaminophen] Nausea Only    Past Medical History, Surgical history, Social history, and Family History were reviewed and updated.  Review of Systems: As above  Physical Exam:  weight is 176 lb (79.8 kg). Her oral  temperature is 98.2 F (36.8 C). Her blood pressure is 140/60 and her pulse is 68. Her respiration is 16.   Elderly white female in no obvious distress. Head and neck exam show no ocular or oral lesions. There is no adenopathy in the neck. Lungs are clear. Cardiac exam regular rate and rhythm with no murmurs rubs or bruits. Physical exam shows bilateral mastectomies. No chest wall nodules are noted. There is no chest wall erythema. There is no axillary adenopathy bilaterally. Abdomen is soft. Has good bowel sounds. There is no fluid wave. There is no palpable liver or spleen tip. Back exam shows some slight kyphosis. She has some point tenderness in the right sacroiliac area. Extremities shows some trace edema in her lower legs. She is age related osteoarthritic changes. Skin exam shows numerous seborrheic keratoses. Neurological exam is nonfocal.  Lab Results  Component Value Date   WBC 8.8 10/10/2016   HGB 12.5 10/10/2016   HCT 34.8 10/10/2016   MCV 96 10/10/2016   PLT 82 (L) 10/10/2016     Chemistry      Component Value Date/Time   NA 137 10/10/2016 0856   NA 139 07/10/2016 1204   K 4.1 10/10/2016 0856   K 4.3 07/10/2016 1204   CL 107 10/10/2016 0856   CL 109 (H) 02/17/2013 1037   CO2 26 10/10/2016 0856   CO2 24 07/10/2016 1204  BUN 13 10/10/2016 0856   BUN 10.2 07/10/2016 1204   CREATININE 0.7 10/10/2016 0856   CREATININE 0.7 07/10/2016 1204      Component Value Date/Time   CALCIUM 8.6 10/10/2016 0856   CALCIUM 8.9 07/10/2016 1204   ALKPHOS 174 (H) 10/10/2016 0856   ALKPHOS 231 (H) 07/10/2016 1204   AST 75 (H) 10/10/2016 0856   AST 89 (H) 07/10/2016 1204   ALT 32 10/10/2016 0856   ALT 41 07/10/2016 1204   BILITOT 1.90 (H) 10/10/2016 0856   BILITOT 1.90 (H) 07/10/2016 1204         Impression and Plan: Karen Richard is an 82 year old white female. Fortunately, I think her problems are not related to anything cancer or her CLL.   I think that her cirrhosis might be an  issue down the road. Her platelet count is dropping slowly. She is asymptomatic with this. She does have some splenomegaly which I think is a byproduct of her cirrhosis.  I looked at her blood under the microscope. I do not see think that looked suspicious.   I think we can get her back in 4 months. I want to try to get her through the wintertime so she will not have to come out in any potentially bad weather.   As always, it is a lot of fun to talk to Karen Richard.    Karen Napoleon, MD 12/12/201710:20 AM

## 2016-12-07 MED FILL — VIT D2 1.25 MG (50,000 UNIT: 1.25 MG | 84 days supply | Qty: 12 | Fill #2

## 2017-02-09 ENCOUNTER — Ambulatory Visit (HOSPITAL_BASED_OUTPATIENT_CLINIC_OR_DEPARTMENT_OTHER): Payer: Medicare Other | Admitting: Hematology & Oncology

## 2017-02-09 ENCOUNTER — Other Ambulatory Visit (HOSPITAL_BASED_OUTPATIENT_CLINIC_OR_DEPARTMENT_OTHER): Payer: Medicare Other

## 2017-02-09 VITALS — BP 143/39 | HR 70 | Temp 98.1°F | Resp 18 | Wt 179.0 lb

## 2017-02-09 DIAGNOSIS — C9110 Chronic lymphocytic leukemia of B-cell type not having achieved remission: Secondary | ICD-10-CM | POA: Diagnosis not present

## 2017-02-09 DIAGNOSIS — K7460 Unspecified cirrhosis of liver: Secondary | ICD-10-CM

## 2017-02-09 DIAGNOSIS — K743 Primary biliary cirrhosis: Secondary | ICD-10-CM

## 2017-02-09 DIAGNOSIS — K766 Portal hypertension: Secondary | ICD-10-CM | POA: Diagnosis not present

## 2017-02-09 DIAGNOSIS — R609 Edema, unspecified: Secondary | ICD-10-CM

## 2017-02-09 LAB — CBC WITH DIFFERENTIAL (CANCER CENTER ONLY)
HCT: 35.5 % (ref 34.8–46.6)
HGB: 12.4 g/dL (ref 11.6–15.9)
MCH: 34.5 pg — AB (ref 26.0–34.0)
MCHC: 34.9 g/dL (ref 32.0–36.0)
MCV: 99 fL (ref 81–101)
PLATELETS: 93 10*3/uL — AB (ref 145–400)
RBC: 3.59 10*6/uL — ABNORMAL LOW (ref 3.70–5.32)
RDW: 16.4 % — ABNORMAL HIGH (ref 11.1–15.7)
WBC: 10.2 10*3/uL — ABNORMAL HIGH (ref 3.9–10.0)

## 2017-02-09 LAB — COMPREHENSIVE METABOLIC PANEL
ALBUMIN: 2.6 g/dL — AB (ref 3.5–5.0)
ALK PHOS: 210 U/L — AB (ref 40–150)
ALT: 35 U/L (ref 0–55)
ANION GAP: 4 meq/L (ref 3–11)
AST: 83 U/L — AB (ref 5–34)
BUN: 11.4 mg/dL (ref 7.0–26.0)
CALCIUM: 9.1 mg/dL (ref 8.4–10.4)
CHLORIDE: 107 meq/L (ref 98–109)
CO2: 26 mEq/L (ref 22–29)
Creatinine: 0.7 mg/dL (ref 0.6–1.1)
EGFR: 75 mL/min/{1.73_m2} — ABNORMAL LOW (ref >90)
Glucose: 193 mg/dl — ABNORMAL HIGH (ref 70–140)
Potassium: 4.1 mEq/L (ref 3.5–5.1)
Sodium: 138 mEq/L (ref 136–145)
Total Bilirubin: 2.36 mg/dL — ABNORMAL HIGH (ref 0.20–1.20)
Total Protein: 6 g/dL — ABNORMAL LOW (ref 6.4–8.3)

## 2017-02-09 LAB — MANUAL DIFFERENTIAL (CHCC SATELLITE)
ALC: 5.4 10*3/uL — AB (ref 0.6–2.2)
ANC (CHCC MAN DIFF): 3.8 10*3/uL (ref 1.5–6.7)
Eos: 4 % (ref 0–7)
LYMPH: 53 % — AB (ref 14–48)
MONO: 6 % (ref 0–13)
PLT EST ~~LOC~~: DECREASED
SEG: 37 % — AB (ref 40–75)

## 2017-02-09 LAB — LACTATE DEHYDROGENASE: LDH: 295 U/L — AB (ref 125–245)

## 2017-02-09 LAB — CHCC SATELLITE - SMEAR

## 2017-02-09 NOTE — Progress Notes (Signed)
Hematology and Oncology Follow Up Visit  Karen Richard [id number] 09/18/1927 83 y.o. 02/09/2017   Principle Diagnosis:  Stage A CLL. 2. 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 3. History of stage I (T1 N0 M0) ductal carcinoma of the left breast,     1999. 4. Cirrhosis due to steatohepatitis  Current Therapy:   Observation     Interim History:  [name] is back for followup. She is having some more issues. She apparently cracked a tooth on the left side of her mouth. She has a see Karen Richard oral surgeon to have this extracted. She is not looking forward to this.  She is having more problems with leg swelling. She had CAT scans done a year ago. She has cirrhosis. She has portal hypertension. I have to believe that the swelling is coming from this.  She has had no problems with infections. She's had no fever. She's had no swollen lymph nodes. There's been no bleeding.   She's had no nausea or vomiting.  She is on diuretics. She says that she cannot take any more diuretics because she is urinating all the time.   Overall, her performance status is ECOG 2.   Medications:  Current Outpatient Prescriptions:  .  Acetaminophen (TYLENOL ARTHRITIS EXT RELIEF PO), Take by mouth as needed., Disp: , Rfl:  .  aspirin (ASPIRIN EC) 81 MG EC tablet, Take 81 mg by mouth every evening. , Disp: , Rfl:  .  ergocalciferol (VITAMIN D2) 50000 units capsule, Take 1 capsule (50,000 Units total) by mouth once a week., Disp: 12 capsule, Rfl: 3 .  furosemide (LASIX) 20 MG tablet, Take 20 mg by mouth daily., Disp: , Rfl:  .  LORazepam (ATIVAN) 0.5 MG tablet, TAKE 1 TABLET BY MOUTH EVERY DAY AS NEEDED FOR ANXIETY, Disp: 30 tablet, Rfl: 0 .  amoxicillin (AMOXIL) 500 MG capsule, Take 500 mg by mouth., Disp: , Rfl:  .  SHINGRIX injection, , Disp: , Rfl:   Allergies:  Allergies  Allergen Reactions  . Codeine Nausea Only  . Darvocet [Propoxyphene N-Acetaminophen] Nausea Only    Past Medical  History, Surgical history, Social history, and Family History were reviewed and updated.  Review of Systems: As above  Physical Exam:  weight is 179 lb (81.2 kg). Her oral temperature is 98.1 F (36.7 C). Her blood pressure is 143/39 (abnormal) and her pulse is 70. Her respiration is 18 and oxygen saturation is 96%.   Elderly white female in no obvious distress. Head and neck exam show no ocular or oral lesions. There is no adenopathy in the neck. Lungs are clear. Cardiac exam regular rate and rhythm with no murmurs rubs or bruits. Physical exam shows bilateral mastectomies. No chest wall nodules are noted. There is no chest wall erythema. There is no axillary adenopathy bilaterally. Abdomen is soft. Has good bowel sounds. There is no fluid wave. There is no palpable liver or spleen tip. Back exam shows some slight kyphosis. She has some point tenderness in the right sacroiliac area. Extremities shows some 3+ edema in her lower legs. She is age related osteoarthritic changes. Skin exam shows numerous seborrheic keratoses. Neurological exam is nonfocal.  Lab Results  Component Value Date   WBC 10.2 (H) 02/09/2017   HGB 12.4 02/09/2017   HCT 35.5 02/09/2017   MCV 99 02/09/2017   PLT 93 (L) 02/09/2017     Chemistry      Component Value Date/Time  NA 137 10/10/2016 0856   NA 139 07/10/2016 1204   K 4.1 10/10/2016 0856   K 4.3 07/10/2016 1204   CL 107 10/10/2016 0856   CL 109 (H) 02/17/2013 1037   CO2 26 10/10/2016 0856   CO2 24 07/10/2016 1204   BUN 13 10/10/2016 0856   BUN 10.2 07/10/2016 1204   CREATININE 0.7 10/10/2016 0856   CREATININE 0.7 07/10/2016 1204      Component Value Date/Time   CALCIUM 8.6 10/10/2016 0856   CALCIUM 8.9 07/10/2016 1204   ALKPHOS 174 (H) 10/10/2016 0856   ALKPHOS 231 (H) 07/10/2016 1204   AST 75 (H) 10/10/2016 0856   AST 89 (H) 07/10/2016 1204   ALT 32 10/10/2016 0856   ALT 41 07/10/2016 1204   BILITOT 1.90 (H) 10/10/2016 0856   BILITOT 1.90 (H)  07/10/2016 1204         Impression and Plan: Karen Richard is Karen Richard 83 year old white female. Fortunately, I think her problems are not related to anything cancer or her CLL.   For right now, I think her main issue is this peripheral edema. This really is becoming more of a problem. Again I really have to think that this is all from her cirrhosis.  She will see Karen Richard, her gastroenterologist, soon. Maybe he can help her out.  It's possible that we may have to consider some IV albumin with Lasix to try to help with the leg swelling.  I don't see anything that is Karen Richard issue with her CLL.   We will see her back in 4 more months.   As always, it is a lot of fun to talk to Karen Richard and her husband.    Karen Napoleon, MD 4/13/20189:52 AM

## 2017-02-13 MED FILL — HYDROCODON-APAP 5-325: 5-325 | 2 days supply | Qty: 8 | Fill #0

## 2017-02-23 ENCOUNTER — Other Ambulatory Visit: Payer: Self-pay | Admitting: *Deleted

## 2017-02-23 DIAGNOSIS — K7460 Unspecified cirrhosis of liver: Secondary | ICD-10-CM

## 2017-02-28 MED FILL — VIT D2 1.25 MG (50,000 UNIT: 1.25 MG | 84 days supply | Qty: 12 | Fill #3

## 2017-05-23 ENCOUNTER — Other Ambulatory Visit: Payer: Self-pay | Admitting: Hematology & Oncology

## 2017-05-23 DIAGNOSIS — R945 Abnormal results of liver function studies: Secondary | ICD-10-CM

## 2017-05-23 DIAGNOSIS — E559 Vitamin D deficiency, unspecified: Secondary | ICD-10-CM

## 2017-05-23 DIAGNOSIS — R7989 Other specified abnormal findings of blood chemistry: Secondary | ICD-10-CM

## 2017-05-23 DIAGNOSIS — M533 Sacrococcygeal disorders, not elsewhere classified: Secondary | ICD-10-CM

## 2017-05-23 DIAGNOSIS — G8929 Other chronic pain: Secondary | ICD-10-CM

## 2017-05-23 MED FILL — VIT D2 1.25 MG (50,000 UNIT: 1.25 MG | 84 days supply | Qty: 12 | Fill #0 | Status: TO

## 2017-06-12 ENCOUNTER — Ambulatory Visit (HOSPITAL_BASED_OUTPATIENT_CLINIC_OR_DEPARTMENT_OTHER): Payer: Medicare Other | Admitting: Family

## 2017-06-12 ENCOUNTER — Other Ambulatory Visit (HOSPITAL_BASED_OUTPATIENT_CLINIC_OR_DEPARTMENT_OTHER): Payer: Medicare Other

## 2017-06-12 VITALS — BP 180/61 | HR 84 | Temp 98.2°F | Resp 18 | Wt 181.0 lb

## 2017-06-12 DIAGNOSIS — C50012 Malignant neoplasm of nipple and areola, left female breast: Secondary | ICD-10-CM

## 2017-06-12 DIAGNOSIS — C9110 Chronic lymphocytic leukemia of B-cell type not having achieved remission: Secondary | ICD-10-CM | POA: Diagnosis not present

## 2017-06-12 DIAGNOSIS — C50011 Malignant neoplasm of nipple and areola, right female breast: Secondary | ICD-10-CM

## 2017-06-12 DIAGNOSIS — R945 Abnormal results of liver function studies: Secondary | ICD-10-CM | POA: Diagnosis not present

## 2017-06-12 DIAGNOSIS — M7989 Other specified soft tissue disorders: Secondary | ICD-10-CM

## 2017-06-12 DIAGNOSIS — K7460 Unspecified cirrhosis of liver: Secondary | ICD-10-CM

## 2017-06-12 DIAGNOSIS — K7581 Nonalcoholic steatohepatitis (NASH): Secondary | ICD-10-CM | POA: Diagnosis not present

## 2017-06-12 DIAGNOSIS — Z853 Personal history of malignant neoplasm of breast: Secondary | ICD-10-CM

## 2017-06-12 LAB — CBC WITH DIFFERENTIAL (CANCER CENTER ONLY)
BASO#: 0.2 10*3/uL (ref 0.0–0.2)
BASO%: 1.4 % (ref 0.0–2.0)
EOS ABS: 0.2 10*3/uL (ref 0.0–0.5)
EOS%: 1.8 % (ref 0.0–7.0)
HCT: 35.7 % (ref 34.8–46.6)
HGB: 12.4 g/dL (ref 11.6–15.9)
LYMPH#: 7.4 10*3/uL — ABNORMAL HIGH (ref 0.9–3.3)
LYMPH%: 70.6 % — ABNORMAL HIGH (ref 14.0–48.0)
MCH: 34.3 pg — AB (ref 26.0–34.0)
MCHC: 34.7 g/dL (ref 32.0–36.0)
MCV: 99 fL (ref 81–101)
MONO#: 0.5 10*3/uL (ref 0.1–0.9)
MONO%: 4.5 % (ref 0.0–13.0)
NEUT#: 2.3 10*3/uL (ref 1.5–6.5)
NEUT%: 21.7 % — AB (ref 39.6–80.0)
PLATELETS: 89 10*3/uL — AB (ref 145–400)
RBC: 3.62 10*6/uL — ABNORMAL LOW (ref 3.70–5.32)
RDW: 17 % — AB (ref 11.1–15.7)
WBC: 10.4 10*3/uL — ABNORMAL HIGH (ref 3.9–10.0)

## 2017-06-12 LAB — PROTIME-INR (CHCC SATELLITE)
INR: 1.2 — AB (ref 2.0–3.5)
PROTIME: 14.4 s — AB (ref 10.6–13.4)

## 2017-06-12 LAB — CMP (CANCER CENTER ONLY)
ALK PHOS: 191 U/L — AB (ref 26–84)
ALT(SGPT): 33 U/L (ref 10–47)
AST: 80 U/L — ABNORMAL HIGH (ref 11–38)
Albumin: 2.7 g/dL — ABNORMAL LOW (ref 3.3–5.5)
BUN: 11 mg/dL (ref 7–22)
CALCIUM: 8.9 mg/dL (ref 8.0–10.3)
CHLORIDE: 105 meq/L (ref 98–108)
CO2: 30 mEq/L (ref 18–33)
Creat: 0.9 mg/dl (ref 0.6–1.2)
GLUCOSE: 171 mg/dL — AB (ref 73–118)
Potassium: 3.8 mEq/L (ref 3.3–4.7)
Sodium: 138 mEq/L (ref 128–145)
Total Bilirubin: 2.6 mg/dl — ABNORMAL HIGH (ref 0.20–1.60)
Total Protein: 6.1 g/dL — ABNORMAL LOW (ref 6.4–8.1)

## 2017-06-12 NOTE — Progress Notes (Signed)
Hematology and Oncology Follow Up Visit  Karen Richard [id number] 09/19/1927 83 y.o. 06/12/2017   Principle Diagnosis:  1. Stage A CLL. 2. 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 3. History of stage I (T1 N0 M0) ductal carcinoma of the left breast,     1999. 4. Cirrhosis due to steatohepatitis  Current Therapy:   Observation   Interim History:  Karen Richard is here today with her husband for follow-up. She is doing well but is concerned that she is still having quite a bit of leg swelling. She has pitting edema of the feet and ankles. Pedal pulses are +1. She is taking her lasix 20 mg PO daily as prescribed. We discussed this at length and she plans to speak with her PCP about a possible vascular referral.  This has effected her mobility and makes walking and traveling difficult. She has not been able to go with her husband down to [place] for graduations or weddings.  Chest exam today was negative. No mass, lesion or rash identified.  Hgb is stable 12.4 with an MCV of 99, platelets are 89 and WBC count is 10.4.  LFT's remain elevated. She has the history of NASH and is followed by Dr. Wynetta Emery. No fever, chills, n/v, cough, rash, dizziness, SOB, chest pain, palpitations, abdominal pain or changes in bowel or bladder habits.  No lymphadenopathy found on exam. No episodes of bleeding, bruising or petechiae.  No c/o numbness or tingling in her extremities. She has an abrasion on her right knee where she states she bumped a spot that was already red. The spot is dry and intact and appears to be healing nicely. She will continue to watch this and consult with her dermatologist if needed.   She has maintained a good appetite and is staying well hydrated. Her weight is stable.   ECOG Performance Status: 1 - Symptomatic but completely ambulatory  Medications:  Allergies as of 06/12/2017      Reactions   Codeine Nausea Only   Darvocet [propoxyphene N-acetaminophen] Nausea  Only      Medication List       Accurate as of 06/12/17 12:23 PM. Always use your most recent med list.          amoxicillin 500 MG capsule Commonly known as:  AMOXIL Take 500 mg by mouth.   aspirin EC 81 MG EC tablet Generic drug:  aspirin Take 81 mg by mouth every evening.   furosemide 20 MG tablet Commonly known as:  LASIX Take 20 mg by mouth daily.   LORazepam 0.5 MG tablet Commonly known as:  ATIVAN TAKE 1 TABLET BY MOUTH EVERY DAY AS NEEDED FOR ANXIETY   SHINGRIX injection Generic drug:  Zoster Vac Recomb Adjuvanted   TYLENOL ARTHRITIS EXT RELIEF PO Take by mouth as needed.   Vitamin D (Ergocalciferol) 50000 units Caps capsule Commonly known as:  DRISDOL TAKE 1 CAPSULE (50,000 UNITS TOTAL) BY MOUTH ONCE A WEEK.       Allergies:  Allergies  Allergen Reactions  . Codeine Nausea Only  . Darvocet [Propoxyphene N-Acetaminophen] Nausea Only    Past Medical History, Surgical history, Social history, and Family History were reviewed and updated.  Review of Systems: All other 10 point review of systems is negative.   Physical Exam:  vitals were not taken for this visit.  Wt Readings from Last 3 Encounters:  02/09/17 179 lb (81.2 kg)  10/10/16 176 lb (79.8 kg)  07/10/16 174 lb (  78.9 kg)    Ocular: Sclerae unicteric, pupils equal, round and reactive to light Ear-nose-throat: Oropharynx clear, dentition fair Lymphatic: No cervical, supraclavicular or axillary adenopathy Lungs no rales or rhonchi, good excursion bilaterally Heart regular rate and rhythm, no murmur appreciated Abd soft, nontender, positive bowel sounds, no liver or spleen tip palpated on exam, no fluid wave  MSK no focal spinal tenderness, no joint edema Neuro: non-focal, well-oriented, appropriate affect Breasts: Bilateral mastectomy. No mass lesion or rash identified on exam.   Lab Results  Component Value Date   WBC 10.4 (H) 06/12/2017   HGB 12.4 06/12/2017   HCT 35.7 06/12/2017    MCV 99 06/12/2017   PLT 89 (L) 06/12/2017   Lab Results  Component Value Date   FERRITIN 358 (H) 02/07/2016   IRON 176 (H) 02/07/2016   TIBC 246 02/07/2016   UIBC 70 (L) 02/07/2016   IRONPCTSAT 72 (H) 02/07/2016   Lab Results  Component Value Date   RBC 3.62 (L) 06/12/2017   No results found for: [facility] Lab Results  Component Value Date   IGGSERUM 1,560 08/10/2014   IGA 222 08/10/2014   IGMSERUM 59 08/10/2014   Lab Results  Component Value Date   TOTALPROTELP 6.0 08/10/2014   ALBUMINELP 53.0 (L) 08/10/2014   A1GS 3.4 08/10/2014   A2GS 9.1 08/10/2014   BETS 6.3 08/10/2014   BETA2SER 4.1 08/10/2014   GAMS 24.1 (H) 08/10/2014   MSPIKE NOT DET 08/10/2014   SPEI * 08/10/2014     Chemistry      Component Value Date/Time   NA 138 06/12/2017 1114   NA 138 02/09/2017 0851   K 3.8 06/12/2017 1114   K 4.1 02/09/2017 0851   CL 105 06/12/2017 1114   CL 109 (H) 02/17/2013 1037   CO2 30 06/12/2017 1114   CO2 26 02/09/2017 0851   BUN 11 06/12/2017 1114   BUN 11.4 02/09/2017 0851   CREATININE 0.9 06/12/2017 1114   CREATININE 0.7 02/09/2017 0851      Component Value Date/Time   CALCIUM 8.9 06/12/2017 1114   CALCIUM 9.1 02/09/2017 0851   ALKPHOS 191 (H) 06/12/2017 1114   ALKPHOS 210 (H) 02/09/2017 0851   AST 80 (H) 06/12/2017 1114   AST 83 (H) 02/09/2017 0851   ALT 33 06/12/2017 1114   ALT 35 02/09/2017 0851   BILITOT 2.60 (H) 06/12/2017 1114   BILITOT 2.36 (H) 02/09/2017 2202      Impression and Plan: Karen Richard is a very pleasant 83 yo caucasian female stage A CLL. Her counts today remain stable. No anemia. No bruising or episodes of bleeding.   LFT's are still elevated with her history of NASH.   She also has history of bilateral breast cancer. Exam today was negative.   She is doing well and her only complaint at this time is the leg swelling and it effect on her mobility and ability to travel.   We will plan to see her back  again in another 4 months for repeat labs and follow-up.   She will contact our office with any questions or concerns. We can certainly see her sooner if need be.   Eliezer Bottom, NP 8/14/201812:23 PM

## 2017-06-25 ENCOUNTER — Other Ambulatory Visit: Payer: Self-pay

## 2017-06-25 DIAGNOSIS — R609 Edema, unspecified: Secondary | ICD-10-CM

## 2017-06-26 ENCOUNTER — Ambulatory Visit: Payer: Medicare Other | Admitting: Cardiovascular Disease

## 2017-07-09 ENCOUNTER — Encounter: Payer: Self-pay | Admitting: Vascular Surgery

## 2017-07-11 ENCOUNTER — Encounter: Payer: Self-pay | Admitting: Vascular Surgery

## 2017-07-11 ENCOUNTER — Ambulatory Visit (HOSPITAL_COMMUNITY)
Admission: RE | Admit: 2017-07-11 | Discharge: 2017-07-11 | Disposition: A | Discharge status: Home / Self Care | Payer: Medicare Other | Source: Ambulatory Visit | Attending: Vascular Surgery | Admitting: Vascular Surgery

## 2017-07-11 ENCOUNTER — Ambulatory Visit (INDEPENDENT_AMBULATORY_CARE_PROVIDER_SITE_OTHER): Payer: Medicare Other | Admitting: Vascular Surgery

## 2017-07-11 VITALS — BP 164/70 | HR 80 | Temp 97.6°F | Resp 16 | Ht 62.0 in | Wt 179.0 lb

## 2017-07-11 DIAGNOSIS — R609 Edema, unspecified: Secondary | ICD-10-CM | POA: Diagnosis not present

## 2017-07-11 DIAGNOSIS — R600 Localized edema: Secondary | ICD-10-CM

## 2017-07-11 NOTE — Progress Notes (Signed)
Patient ID: Karen Richard, female   DOB: 03-28-1927, 83 y.o.   MRN: [id number]  Reason for Consult: New Patient (Initial Visit) (bilateral lower extremity swelling)   Referred by Maury Dus, MD  Subjective:     HPI:  Karen Richard is a 83 y.o. female presents for evaluation of bilateral lower extremity swelling. She states she first noted the swelling 2 years ago. She does have bilateral knee replacements but these were several years ago to be unrelated. She has been on diuretic which have helped minimally. She is also used compression stockings intermittently. She does not walk much at this time and she has gained 40 pounds in the past few years. She does not have a history of DVT that she remembers. She does not have tissue loss or ulceration. She has no documented varicosities.  Past Medical History:  Diagnosis Date  . Anxiety   . Arthritis   . Cancer ([place])   . Cataract   . Hyperlipidemia   . Neuromuscular disorder ([place])   . Swelling in chest    rigth breast where mastectomy was performed    Family History  Problem Relation Age of Onset  . Diabetes Mother   . Heart attack Mother   . Diabetes Father   . Heart attack Father    Past Surgical History:  Procedure Laterality Date  . ABDOMINAL HYSTERECTOMY    . APPENDECTOMY    . BREAST SURGERY    . CHOLECYSTECTOMY    . EYE SURGERY    . JOINT REPLACEMENT      Short Social History:  Social History  Substance Use Topics  . Smoking status: Never Smoker  . Smokeless tobacco: Never Used     Comment: never used tobacco  . Alcohol use No    Allergies  Allergen Reactions  . Codeine Nausea Only  . Darvocet [Propoxyphene N-Acetaminophen] Nausea Only    Current Outpatient Prescriptions  Medication Sig Dispense Refill  . Acetaminophen (TYLENOL ARTHRITIS EXT RELIEF PO) Take by mouth as needed.    Marland Kitchen amoxicillin (AMOXIL) 500 MG capsule Take 500 mg by mouth.    Marland Kitchen aspirin (ASPIRIN EC) 81 MG EC tablet Take 81 mg by  mouth every evening.     . furosemide (LASIX) 20 MG tablet Take 20 mg by mouth daily.    Marland Kitchen LORazepam (ATIVAN) 0.5 MG tablet TAKE 1 TABLET BY MOUTH EVERY DAY AS NEEDED FOR ANXIETY 30 tablet 0  . Vitamin D, Ergocalciferol, (DRISDOL) 50000 units CAPS capsule TAKE 1 CAPSULE (50,000 UNITS TOTAL) BY MOUTH ONCE A WEEK. 12 capsule 3  . mupirocin ointment (BACTROBAN) 2 %     . SHINGRIX injection      No current facility-administered medications for this visit.     Review of Systems  Constitutional:  Constitutional negative. HENT: HENT negative.  Eyes: Eyes negative.  Respiratory: Positive for shortness of breath.  Cardiovascular: Positive for leg swelling.  GI: Gastrointestinal negative.  Musculoskeletal: Positive for back pain and leg pain.  Skin: Skin negative.  Neurological: Neurological negative. Hematologic: Hematologic/lymphatic negative.  Psychiatric: Psychiatric negative.        Objective:  Objective   Vitals:   07/11/17 0953  BP: (!) 164/70  Pulse: 80  Resp: 16  Temp: 97.6 F (36.4 C)  SpO2: 97%  Weight: 179 lb (81.2 kg)  Height: 5' 2"  (1.575 m)   Body mass index is 32.74 kg/m.  Physical Exam  Constitutional: She appears well-developed.  HENT:  Head: Normocephalic.  Neck: Normal range of motion. Neck supple.  Cardiovascular:  Pulses:      Carotid pulses are 2+ on the right side, and 2+ on the left side.      Radial pulses are 2+ on the right side, and 2+ on the left side.       Popliteal pulses are 2+ on the right side, and 2+ on the left side.  Abdominal: Soft. She exhibits no mass.  Musculoskeletal: She exhibits edema.  Lymphadenopathy:    She has no cervical adenopathy.  Neurological: She is alert.  Skin: Skin is warm and dry.  Psychiatric: She has a normal mood and affect. Her behavior is normal. Judgment and thought content normal.    Data: I have independently interpreted her lower extremity venous evaluation she does have reflux in the left greater  saphenous vein which is intermittent most of the diameter is less than 0.4 cm with the saphenofemoral junction 0.57. She has saphenofemoral junction reflux on the right only with diameter 0.58 cm.     Assessment/Plan:     83 year old female here for evaluation of bilateral lower extremity swelling he does have carries a reflux in her great saphenous veins bilaterally although the veins are not enlarged she does not have any evidence of varicosities. It is likely that her swelling is multifactorial and I discussed this with her. We discussed the need to elevate her legs when recumbent as well as wearing compression stockings and I given her information about this. We have also talked about walking and exercise specifically including water aerobics which can be very helpful for leg swelling. I also discussed the need for weight loss to control her edema and she demonstrates good understanding. There are no vascular interventions to help her swelling and she can therefore follow up on a when necessary basis.     Waynetta Sandy MD [facility]

## 2017-08-13 MED FILL — VIT D2 1.25 MG (50,000 UNIT: 1.25 MG | 84 days supply | Qty: 12 | Fill #1 | Status: TO

## 2017-08-27 ENCOUNTER — Other Ambulatory Visit: Payer: Self-pay | Admitting: Family Medicine

## 2017-08-27 ENCOUNTER — Ambulatory Visit
Admission: RE | Admit: 2017-08-27 | Discharge: 2017-08-27 | Disposition: A | Discharge status: Home / Self Care | Payer: Medicare Other | Source: Ambulatory Visit | Attending: Family Medicine | Admitting: Family Medicine

## 2017-08-27 DIAGNOSIS — R053 Chronic cough: Secondary | ICD-10-CM

## 2017-08-27 DIAGNOSIS — R05 Cough: Secondary | ICD-10-CM

## 2017-09-05 ENCOUNTER — Inpatient Hospital Stay (HOSPITAL_COMMUNITY)
Admission: EM | Admit: 2017-09-05 | Discharge: 2017-09-08 | DRG: 292 | Disposition: A | Discharge status: Nursing Home (Includes ICF) | Payer: Medicare Other | Attending: Internal Medicine | Admitting: Internal Medicine

## 2017-09-05 ENCOUNTER — Encounter (HOSPITAL_COMMUNITY): Payer: Self-pay | Admitting: *Deleted

## 2017-09-05 ENCOUNTER — Other Ambulatory Visit: Payer: Self-pay

## 2017-09-05 ENCOUNTER — Emergency Department (HOSPITAL_COMMUNITY): Payer: Medicare Other

## 2017-09-05 DIAGNOSIS — Z9049 Acquired absence of other specified parts of digestive tract: Secondary | ICD-10-CM

## 2017-09-05 DIAGNOSIS — G4700 Insomnia, unspecified: Secondary | ICD-10-CM | POA: Diagnosis not present

## 2017-09-05 DIAGNOSIS — Z8249 Family history of ischemic heart disease and other diseases of the circulatory system: Secondary | ICD-10-CM

## 2017-09-05 DIAGNOSIS — Z7982 Long term (current) use of aspirin: Secondary | ICD-10-CM

## 2017-09-05 DIAGNOSIS — G2581 Restless legs syndrome: Secondary | ICD-10-CM | POA: Diagnosis present

## 2017-09-05 DIAGNOSIS — R601 Generalized edema: Secondary | ICD-10-CM | POA: Diagnosis not present

## 2017-09-05 DIAGNOSIS — I5031 Acute diastolic (congestive) heart failure: Secondary | ICD-10-CM | POA: Diagnosis not present

## 2017-09-05 DIAGNOSIS — E876 Hypokalemia: Secondary | ICD-10-CM | POA: Diagnosis present

## 2017-09-05 DIAGNOSIS — Z66 Do not resuscitate: Secondary | ICD-10-CM | POA: Diagnosis present

## 2017-09-05 DIAGNOSIS — Z9013 Acquired absence of bilateral breasts and nipples: Secondary | ICD-10-CM

## 2017-09-05 DIAGNOSIS — R609 Edema, unspecified: Secondary | ICD-10-CM | POA: Diagnosis not present

## 2017-09-05 DIAGNOSIS — E8809 Other disorders of plasma-protein metabolism, not elsewhere classified: Secondary | ICD-10-CM | POA: Diagnosis not present

## 2017-09-05 DIAGNOSIS — C9110 Chronic lymphocytic leukemia of B-cell type not having achieved remission: Secondary | ICD-10-CM | POA: Diagnosis present

## 2017-09-05 DIAGNOSIS — R0602 Shortness of breath: Secondary | ICD-10-CM | POA: Diagnosis not present

## 2017-09-05 DIAGNOSIS — Z885 Allergy status to narcotic agent status: Secondary | ICD-10-CM

## 2017-09-05 DIAGNOSIS — F419 Anxiety disorder, unspecified: Secondary | ICD-10-CM | POA: Diagnosis present

## 2017-09-05 DIAGNOSIS — K743 Primary biliary cirrhosis: Secondary | ICD-10-CM

## 2017-09-05 DIAGNOSIS — K7460 Unspecified cirrhosis of liver: Secondary | ICD-10-CM | POA: Diagnosis present

## 2017-09-05 DIAGNOSIS — D696 Thrombocytopenia, unspecified: Secondary | ICD-10-CM | POA: Diagnosis present

## 2017-09-05 DIAGNOSIS — Z90710 Acquired absence of both cervix and uterus: Secondary | ICD-10-CM

## 2017-09-05 DIAGNOSIS — R7989 Other specified abnormal findings of blood chemistry: Secondary | ICD-10-CM

## 2017-09-05 DIAGNOSIS — K7581 Nonalcoholic steatohepatitis (NASH): Secondary | ICD-10-CM | POA: Diagnosis present

## 2017-09-05 DIAGNOSIS — E559 Vitamin D deficiency, unspecified: Secondary | ICD-10-CM | POA: Diagnosis present

## 2017-09-05 DIAGNOSIS — Z79899 Other long term (current) drug therapy: Secondary | ICD-10-CM

## 2017-09-05 DIAGNOSIS — E785 Hyperlipidemia, unspecified: Secondary | ICD-10-CM | POA: Diagnosis present

## 2017-09-05 DIAGNOSIS — Z853 Personal history of malignant neoplasm of breast: Secondary | ICD-10-CM

## 2017-09-05 DIAGNOSIS — I447 Left bundle-branch block, unspecified: Secondary | ICD-10-CM | POA: Diagnosis present

## 2017-09-05 LAB — URINALYSIS, ROUTINE W REFLEX MICROSCOPIC
Bilirubin Urine: NEGATIVE
GLUCOSE, UA: NEGATIVE mg/dL
HGB URINE DIPSTICK: NEGATIVE
Ketones, ur: NEGATIVE mg/dL
Leukocytes, UA: NEGATIVE
Nitrite: NEGATIVE
PROTEIN: NEGATIVE mg/dL
Specific Gravity, Urine: 1.004 — ABNORMAL LOW (ref 1.005–1.030)
pH: 5 (ref 5.0–8.0)

## 2017-09-05 LAB — CBC
HCT: 35.4 % — ABNORMAL LOW (ref 36.0–46.0)
Hemoglobin: 12.3 g/dL (ref 12.0–15.0)
MCH: 33.3 pg (ref 26.0–34.0)
MCHC: 34.7 g/dL (ref 30.0–36.0)
MCV: 95.9 fL (ref 78.0–100.0)
PLATELETS: 99 10*3/uL — AB (ref 150–400)
RBC: 3.69 MIL/uL — ABNORMAL LOW (ref 3.87–5.11)
RDW: 17.2 % — AB (ref 11.5–15.5)
WBC: 13 10*3/uL — ABNORMAL HIGH (ref 4.0–10.5)

## 2017-09-05 LAB — HEPATIC FUNCTION PANEL
ALK PHOS: 160 U/L — AB (ref 38–126)
ALT: 35 U/L (ref 14–54)
AST: 89 U/L — ABNORMAL HIGH (ref 15–41)
Albumin: 2.6 g/dL — ABNORMAL LOW (ref 3.5–5.0)
BILIRUBIN INDIRECT: 2.3 mg/dL — AB (ref 0.3–0.9)
Bilirubin, Direct: 0.9 mg/dL — ABNORMAL HIGH (ref 0.1–0.5)
TOTAL PROTEIN: 5.9 g/dL — AB (ref 6.5–8.1)
Total Bilirubin: 3.2 mg/dL — ABNORMAL HIGH (ref 0.3–1.2)

## 2017-09-05 LAB — I-STAT TROPONIN, ED
TROPONIN I, POC: 0 ng/mL (ref 0.00–0.08)
Troponin i, poc: 0.01 ng/mL (ref 0.00–0.08)

## 2017-09-05 LAB — BASIC METABOLIC PANEL
Anion gap: 6 (ref 5–15)
BUN: 13 mg/dL (ref 6–20)
CALCIUM: 8.7 mg/dL — AB (ref 8.9–10.3)
CO2: 27 mmol/L (ref 22–32)
CREATININE: 0.83 mg/dL (ref 0.44–1.00)
Chloride: 103 mmol/L (ref 101–111)
GFR calc Af Amer: >60 mL/min (ref >60)
GLUCOSE: 135 mg/dL — AB (ref 65–99)
Potassium: 4.4 mmol/L (ref 3.5–5.1)
Sodium: 136 mmol/L (ref 135–145)

## 2017-09-05 LAB — PROTIME-INR
INR: 1.37
Prothrombin Time: 16.8 seconds — ABNORMAL HIGH (ref 11.4–15.2)

## 2017-09-05 LAB — BRAIN NATRIURETIC PEPTIDE: B Natriuretic Peptide: 140.6 pg/mL — ABNORMAL HIGH (ref 0.0–100.0)

## 2017-09-05 MED ORDER — FUROSEMIDE 10 MG/ML IJ SOLN
80.0000 mg | Freq: Two times a day (BID) | INTRAMUSCULAR | Status: DC
  Administered 2017-09-05 – 2017-09-08 (×6): 80 mg via INTRAVENOUS
  Filled 2017-09-05 (×6): qty 8

## 2017-09-05 MED ORDER — ALBUMIN HUMAN 25 % IV SOLN
25.0000 g | Freq: Four times a day (QID) | INTRAVENOUS | Status: AC
  Administered 2017-09-05 – 2017-09-06 (×4): 25 g via INTRAVENOUS
  Filled 2017-09-05 (×4): qty 100

## 2017-09-05 MED ORDER — LORAZEPAM 0.5 MG PO TABS: 0.5000 mg | ORAL_TABLET | Freq: Four times a day (QID) | ORAL | Status: DC | PRN

## 2017-09-05 MED ORDER — TRAZODONE HCL 50 MG PO TABS
50.0000 mg | ORAL_TABLET | Freq: Every evening | ORAL | Status: DC | PRN
  Administered 2017-09-05: 50 mg via ORAL
  Filled 2017-09-05: qty 1

## 2017-09-05 MED ORDER — ASPIRIN EC 81 MG PO TBEC
81.0000 mg | DELAYED_RELEASE_TABLET | Freq: Every evening | ORAL | Status: DC
  Administered 2017-09-05 – 2017-09-07 (×3): 81 mg via ORAL
  Filled 2017-09-05 (×3): qty 1

## 2017-09-05 NOTE — Care Management Note (Addendum)
Case Management Note  Patient Details  Name: CHAKA BOYSON MRN: [id number] Date of Birth: [birth year]  Subjective/Objective:                  Dry mouth and fluid build up  Action/Plan: CM spoke with the patient and her husband at the bedside in the ED. Patient states she lives in a [place] ([place] at [place]) and have the resources needed at discharge.   Expected Discharge Date:                  Expected Discharge Plan:  Home/Self Care  In-House Referral:     Discharge planning Services     Post Acute Care Choice:    Choice offered to:     DME Arranged:    DME Agency:     HH Arranged:    HH Agency:     Status of Service:  In process, will continue to follow  If discussed at Long Length of Stay Meetings, dates discussed:    Additional Comments:  Apolonio Schneiders, RN 09/05/2017, 8:40 PM

## 2017-09-05 NOTE — ED Notes (Signed)
Attempted report and the secretary would not transfer this RN to the receiving RN stating "We just got the page for this patient."

## 2017-09-05 NOTE — ED Triage Notes (Signed)
Pt reports having ongoing swelling to her legs and now abd is distended. Pt has sob with mild exertion. Has moderate back pain. Airway intact at triage.

## 2017-09-05 NOTE — H&P (Signed)
History and Physical    Karen Richard CVE:[id number] DOB: 03/01/1927 DOA: 09/05/2017  PCP: Maury Dus, MD Patient coming from: [facility] - assisted living.   Chief Complaint: DOE and weight gain   HPI: Karen Richard is a 83 y.o. female with medical history significant of breast cancer, insomnia, questionable [place], osteoarthritis, chronic lower extremity swelling was sent to the hospital due to complaints of progressive weight gain and dyspnea on exertion. Patient has been dealing with chronic lower extremity swelling and edema for past several months over the course of last 30 days they have noted at least 35 pound weight gain. Patient was seen by outpatient physician who increased her Lasix to 40 mg to be taken for a few days and was tapered down to 20 mg today. Despite of this she continued to regain and has progressive swelling of her lower extremity but now also with upper extremity and abdominal swelling. Due to this higher activity and mobility has been very limited. She has been seen by Dr. Donzetta Matters from vascular 2 months ago who recommended increasing physical activity and what therapy. Patient tells me even after taking Lasix in the morning she continues to urinate throughout the day and at night but also admits of drinking lots of fluid.  In the ER patient was noted to be dyspneic even with minimal exertion. Her labs showed elevated T bilirubin at 3.2 but previously been elevated at 2.6 chronically and also thrombocytopenia which has been chronic as well. Her albumin was noted to be 2.6. She was admitted for observation for IV diuresis given her progression of her symptoms limiting even minimal ADLs.   Chest x-ray: No evidence of acute cardiopulmonary disease   Review of Systems: As per HPI otherwise 10 point review of systems negative.   Past Medical History:  Diagnosis Date  . Anxiety   . Arthritis   . Cancer ([place])   . Cataract   . Hyperlipidemia   . Neuromuscular  disorder ([place])   . Swelling in chest    rigth breast where mastectomy was performed     Past Surgical History:  Procedure Laterality Date  . ABDOMINAL HYSTERECTOMY    . APPENDECTOMY    . BREAST SURGERY    . CHOLECYSTECTOMY    . EYE SURGERY    . JOINT REPLACEMENT       reports that  has never smoked. she has never used smokeless tobacco. She reports that she does not drink alcohol or use drugs.  Allergies  Allergen Reactions  . Codeine Nausea Only  . Darvocet [Propoxyphene N-Acetaminophen] Nausea Only    Family History  Problem Relation Age of Onset  . Diabetes Mother   . Heart attack Mother   . Diabetes Father   . Heart attack Father     Acceptable: Family history reviewed and not pertinent (If you reviewed it)  Prior to Admission medications   Medication Sig Start Date End Date Taking? Authorizing Provider  Acetaminophen (TYLENOL ARTHRITIS EXT RELIEF PO) Take 1 tablet as needed by mouth.    Yes [provider]  amoxicillin (AMOXIL) 500 MG capsule Take 2,000 mg See admin instructions by mouth. 4 CAPS PRIOR TO DENTAL PROCEDURE 02/07/17  Yes [provider]  aspirin (ASPIRIN EC) 81 MG EC tablet Take 81 mg by mouth every evening.    Yes [provider]  furosemide (LASIX) 20 MG tablet Take 40 mg daily by mouth.  10/09/16  Yes [provider]  ibuprofen (ADVIL,MOTRIN) 200  MG tablet Take 400 mg every 6 (six) hours as needed by mouth for headache.   Yes [provider]  LORazepam (ATIVAN) 0.5 MG tablet TAKE 1 TABLET BY MOUTH EVERY DAY AS NEEDED FOR ANXIETY 01/28/14  Yes Ennever, Rudell Cobb, MD  [facility] injection  02/02/17  Yes [provider]  Vitamin D, Ergocalciferol, (DRISDOL) 50000 units CAPS capsule TAKE 1 CAPSULE (50,000 UNITS TOTAL) BY MOUTH ONCE A WEEK. Patient taking differently: Take 50,000 Units once a week by mouth. ON SATURDAY 05/23/17  Yes Volanda Napoleon, MD  mupirocin ointment (BACTROBAN) 2 %  06/13/17   [provider]    Physical Exam: Vitals:   09/05/17 1830 09/05/17 1917 09/05/17 1930 09/05/17 1945  BP: (!) 155/62 (!) 143/58 (!) 164/49 (!) 169/60  Pulse: (!) 104 (!) 101 98 99  Resp: 13 12 17 16   Temp:      TempSrc:      SpO2: 96% 98% 97% 98%      Constitutional: NAD, calm, comfortable Vitals:   09/05/17 1830 09/05/17 1917 09/05/17 1930 09/05/17 1945  BP: (!) 155/62 (!) 143/58 (!) 164/49 (!) 169/60  Pulse: (!) 104 (!) 101 98 99  Resp: 13 12 17 16   Temp:      TempSrc:      SpO2: 96% 98% 97% 98%   Eyes: PERRL, lids and conjunctivae normal ENMT: Mucous membranes are moist. Posterior pharynx clear of any exudate or lesions.Normal dentition.  Neck: normal, supple, no masses, no thyromegaly Respiratory: clear to auscultation bilaterally, no wheezing, no crackles. Normal respiratory effort. No accessory muscle use.  Cardiovascular: Regular rate and rhythm, no murmurs / rubs / gallops. No extremity edema. 2+ pedal pulses. No carotid bruits.  Abdomen: no tenderness, no masses palpated. No hepatosplenomegaly. Bowel sounds positive.  Musculoskeletal: 2+ UE pitting edema, 3+ b/l LE ext pitting edema  Skin: no rashes, lesions, ulcers. No induration Neurologic: CN 2-12 grossly intact. Sensation intact, DTR normal. Strength 5/5 in all 4.  Psychiatric: Normal judgment and insight. Alert and oriented x 3. Normal mood.     Labs on Admission: I have personally reviewed following labs and imaging studies  CBC: Recent Labs  Lab 09/05/17 1139  WBC 13.0*  HGB 12.3  HCT 35.4*  MCV 95.9  PLT 99*   Basic Metabolic Panel: Recent Labs  Lab 09/05/17 1139  NA 136  K 4.4  CL 103  CO2 27  GLUCOSE 135*  BUN 13  CREATININE 0.83  CALCIUM 8.7*   GFR: CrCl cannot be calculated (Unknown ideal weight.). Liver Function Tests: Recent Labs  Lab 09/05/17 1720  AST 89*  ALT 35  ALKPHOS 160*  BILITOT 3.2*  PROT 5.9*  ALBUMIN 2.6*   No results for input(s): LIPASE, AMYLASE in the  last 168 hours. No results for input(s): AMMONIA in the last 168 hours. Coagulation Profile: Recent Labs  Lab 09/05/17 1720  INR 1.37   Cardiac Enzymes: No results for input(s): CKTOTAL, CKMB, CKMBINDEX, TROPONINI in the last 168 hours. BNP (last 3 results) No results for input(s): PROBNP in the last 8760 hours. HbA1C: No results for input(s): HGBA1C in the last 72 hours. CBG: No results for input(s): GLUCAP in the last 168 hours. Lipid Profile: No results for input(s): CHOL, HDL, LDLCALC, TRIG, CHOLHDL, LDLDIRECT in the last 72 hours. Thyroid Function Tests: No results for input(s): TSH, T4TOTAL, FREET4, T3FREE, THYROIDAB in the last 72 hours. Anemia Panel: No results for input(s): VITAMINB12, FOLATE, FERRITIN, TIBC, IRON, RETICCTPCT  in the last 72 hours. Urine analysis:    Component Value Date/Time   COLORURINE YELLOW 09/05/2017 1155   APPEARANCEUR CLEAR 09/05/2017 1155   LABSPEC 1.004 (L) 09/05/2017 1155   PHURINE 5.0 09/05/2017 1155   GLUCOSEU NEGATIVE 09/05/2017 1155   HGBUR NEGATIVE 09/05/2017 1155   HGBUR negative 02/01/2010 1012   BILIRUBINUR NEGATIVE 09/05/2017 1155   BILIRUBINUR neg 01/11/2011 1449   KETONESUR NEGATIVE 09/05/2017 1155   PROTEINUR NEGATIVE 09/05/2017 1155   UROBILINOGEN 0.2 01/11/2011 1449   UROBILINOGEN 0.2 02/01/2010 1012   NITRITE NEGATIVE 09/05/2017 1155   LEUKOCYTESUR NEGATIVE 09/05/2017 1155   Sepsis Labs: !!!!!!!!!!!!!!!!!!!!!!!!!!!!!!!!!!!!!!!!!!!! @LABRCNTIP (procalcitonin:4,lacticidven:4) )No results found for this or any previous visit (from the past 240 hour(s)).   Radiological Exams on Admission: Dg Chest 2 View  Result Date: 09/05/2017 CLINICAL DATA:  Acute shortness of breath. EXAM: CHEST  2 VIEW COMPARISON:  08/27/2017 and prior exams FINDINGS: Upper limits normal heart size and mild peribronchial thickening again noted. Minimal scarring within the mid lungs again noted. There is no evidence of focal airspace disease, pulmonary  edema, suspicious pulmonary nodule/mass, pleural effusion, or pneumothorax. No acute bony abnormalities are identified. IMPRESSION: No evidence of acute cardiopulmonary disease. Electronically Signed   By: Margarette Canada M.D.   On: 09/05/2017 13:21      Assessment/Plan Active Problems:   Anasarca   Generalized anasarca Dyspnea on exertion Hypoalbuminemia -Admitted for observation for diuresis -Unknown etiology of her generalized anasarca. Likely multifactorial from decreased mobility in low albumin?.  -Outpatient closely following outpatient primary care physician and vascular specialist. Failed outpatient oral therapy -Strict input and output, daily weights, fluid restriction 1.5 L -Lasix 80 mg IV twice daily, order echocardiogram for tomorrow morning. Echocardiogram a year ago showed ejection fraction 55% -Closely monitor electrolytes -Will order albumin 25% every 6 hours X4 doses -Ambulatory pulse ox -Patient and husband counseled on limiting the patient's fluid intake at home.  History of breast cancer -Follow-up outpatient with Dr. Marin Olp  History of osteoarthritis -Pain control  Vitamin D deficiency -Outpatient vitamin D supplement  Insomnia -Trazodone at night. If needed  Elevated total bilirubin -This is chronic. In the past has been as high as 2.6 with today's 3.2. Closely monitor  Thrombocytopenia -Appears to be chronic as well. No active signs of bleeding.        DVT prophylaxis: Early ambulation Code Status: DO NOT RESUSCITATE Family Communication: Husband at bedside Disposition Plan: TBD Consults called: None Admission status: Obs tele    Trinh Sanjose Arsenio Loader MD [facility]   If 7PM-7AM, please contact night-coverage [email or web address] Password [email or web address]  09/05/2017, 8:01 PM

## 2017-09-05 NOTE — ED Notes (Signed)
Pure wick placed.

## 2017-09-05 NOTE — ED Provider Notes (Signed)
[facility] EMERGENCY DEPARTMENT Provider Note   CSN: [id number] Arrival date & time: 09/05/17  1102     History   Chief Complaint Chief Complaint  Patient presents with  . Leg Swelling    HPI Karen Richard is a 83 y.o. female.  The history is provided by the patient, the spouse and medical records. No language interpreter was used.  Shortness of Breath  This is a new problem. The average episode lasts 3 weeks. The problem occurs continuously.The current episode started more than 1 week ago. The problem has been gradually worsening. Associated symptoms include leg swelling. Pertinent negatives include no fever, no rhinorrhea, no neck pain, no cough, no sputum production, no hemoptysis, no wheezing, no chest pain, no syncope, no vomiting, no abdominal pain and no leg pain. Treatments tried: diuretics. The treatment provided no relief. Associated medical issues do not include asthma, COPD, chronic lung disease, CAD, heart failure or DVT. Associated medical issues comments: no formal dx of CHF.    Past Medical History:  Diagnosis Date  . Anxiety   . Arthritis   . Cancer ([place])   . Cataract   . Hyperlipidemia   . Neuromuscular disorder ([place])   . Swelling in chest    rigth breast where mastectomy was performed     Patient Active Problem List   Diagnosis Date Noted  . Cirrhosis of liver without ascites ([place]) 02/09/2017  . LBBB (left bundle branch block) 03/31/2016  . Lymphedema 03/31/2016  . Peripheral edema 02/06/2013  . Back pain, thoracic 05/14/2012  . CLL (chronic lymphocytic leukemia) ([place]) 02/19/2012  . Urinary frequency 01/11/2011  . RUQ abdominal pain 01/11/2011  . ARTHRITIS, CERVICAL SPINE 06/22/2010  . DISTURBANCE OF SKIN SENSATION 06/22/2010  . HYPOGLYCEMIA, UNSPECIFIED 02/08/2010  . IRREGULAR HEART RATE 02/01/2010  . ABNORMAL INVOLUNTARY MOVEMENTS 02/01/2010  . CHEST DISCOMFORT 02/01/2010  . INSOMNIA 07/22/2009  . OTHER MALAISE AND  FATIGUE 07/22/2009  . HEAT INTOLERANCE 07/22/2009  . VITAMIN D DEFICIENCY 02/11/2009  . Malignant neoplasm of female breast ([place]) 12/04/2008  . ANXIETY, SITUATIONAL 07/22/2008  . NOCTURIA 07/22/2008  . Elevated blood pressure reading without diagnosis of hypertension 07/22/2008  . SYMPTOM, COUGH 03/11/2007  . ONYCHOMYCOSIS 12/27/2006  . HYPERLIPIDEMIA 12/27/2006  . RESTLESS LEGS SYNDROME 12/27/2006  . SEBORRHEIC KERATOSIS, NOS 12/27/2006  . DJD, UNSPECIFIED 12/27/2006  . OSTEOARTHRITIS, MULTI SITES 12/27/2006    Past Surgical History:  Procedure Laterality Date  . ABDOMINAL HYSTERECTOMY    . APPENDECTOMY    . BREAST SURGERY    . CHOLECYSTECTOMY    . EYE SURGERY    . JOINT REPLACEMENT      OB History    No data available       Home Medications    Prior to Admission medications   Medication Sig Start Date End Date Taking? Authorizing Provider  Acetaminophen (TYLENOL ARTHRITIS EXT RELIEF PO) Take 1 tablet as needed by mouth.    Yes [provider]  amoxicillin (AMOXIL) 500 MG capsule Take 2,000 mg See admin instructions by mouth. 4 CAPS PRIOR TO DENTAL PROCEDURE 02/07/17  Yes [provider]  aspirin (ASPIRIN EC) 81 MG EC tablet Take 81 mg by mouth every evening.    Yes [provider]  furosemide (LASIX) 20 MG tablet Take 40 mg daily by mouth.  10/09/16  Yes [provider]  ibuprofen (ADVIL,MOTRIN) 200 MG tablet Take 400 mg every 6 (six) hours as needed by mouth for headache.  Yes [provider]  LORazepam (ATIVAN) 0.5 MG tablet TAKE 1 TABLET BY MOUTH EVERY DAY AS NEEDED FOR ANXIETY 01/28/14  Yes Ennever, Rudell Cobb, MD  [facility] injection  02/02/17  Yes [provider]  Vitamin D, Ergocalciferol, (DRISDOL) 50000 units CAPS capsule TAKE 1 CAPSULE (50,000 UNITS TOTAL) BY MOUTH ONCE A WEEK. Patient taking differently: Take 50,000 Units once a week by mouth. ON SATURDAY 05/23/17  Yes Volanda Napoleon, MD  mupirocin ointment  Drue Stager) 2 %  06/13/17   [provider]    Family History Family History  Problem Relation Age of Onset  . Diabetes Mother   . Heart attack Mother   . Diabetes Father   . Heart attack Father     Social History Social History   Tobacco Use  . Smoking status: Never Smoker  . Smokeless tobacco: Never Used  . Tobacco comment: never used tobacco  Substance Use Topics  . Alcohol use: No    Alcohol/week: 0.0 oz  . Drug use: No     Allergies   Codeine and Darvocet [propoxyphene n-acetaminophen]   Review of Systems Review of Systems  Constitutional: Positive for fatigue. Negative for chills, diaphoresis and fever.  HENT: Negative for congestion and rhinorrhea.   Eyes: Negative for visual disturbance.  Respiratory: Positive for shortness of breath. Negative for cough, hemoptysis, sputum production, chest tightness, wheezing and stridor.   Cardiovascular: Positive for leg swelling. Negative for chest pain and syncope.  Gastrointestinal: Negative for abdominal pain, constipation, diarrhea, nausea and vomiting.  Genitourinary: Positive for frequency. Negative for decreased urine volume, dysuria and flank pain.  Musculoskeletal: Negative for back pain, neck pain and neck stiffness.  Neurological: Negative for light-headedness and numbness.  Psychiatric/Behavioral: Negative for agitation.  All other systems reviewed and are negative.    Physical Exam Updated Vital Signs BP (!) 149/58   Pulse 91   Temp 97.6 F (36.4 C) (Oral)   Resp 20   SpO2 99%   Physical Exam  Constitutional: She is oriented to person, place, and time. She appears well-developed and well-nourished. No distress.  HENT:  Head: Normocephalic and atraumatic.  Eyes: Conjunctivae are normal.  Neck: Neck supple.  Cardiovascular: Normal rate and intact distal pulses.  Murmur heard. Pulmonary/Chest: Effort normal. No accessory muscle usage. No tachypnea and no bradypnea. No respiratory distress.  She has no wheezes. She has rales in the right lower field and the left lower field. She exhibits no tenderness.  Subtle bilateral crackles in the lower bases of the lungs.  Abdominal: Soft. She exhibits distension. There is no tenderness.  Musculoskeletal: She exhibits no tenderness.       Right forearm: She exhibits edema.       Left forearm: She exhibits edema.       Right lower leg: She exhibits edema.       Left lower leg: She exhibits edema.  Neurological: She is alert and oriented to person, place, and time. No sensory deficit. She exhibits normal muscle tone.  Skin: Skin is warm and dry. Capillary refill takes less than 2 seconds. No rash noted. She is not diaphoretic. No erythema.  Psychiatric: She has a normal mood and affect.  Nursing note and vitals reviewed.    ED Treatments / Results  Labs (all labs ordered are listed, but only abnormal results are displayed) Labs Reviewed  BASIC METABOLIC PANEL - Abnormal; Notable for the following components:      Result Value   Glucose, Bld  135 (*)    Calcium 8.7 (*)    All other components within normal limits  CBC - Abnormal; Notable for the following components:   WBC 13.0 (*)    RBC 3.69 (*)    HCT 35.4 (*)    RDW 17.2 (*)    Platelets 99 (*)    All other components within normal limits  URINALYSIS, ROUTINE W REFLEX MICROSCOPIC - Abnormal; Notable for the following components:   Specific Gravity, Urine 1.004 (*)    All other components within normal limits  BRAIN NATRIURETIC PEPTIDE - Abnormal; Notable for the following components:   B Natriuretic Peptide 140.6 (*)    All other components within normal limits  HEPATIC FUNCTION PANEL - Abnormal; Notable for the following components:   Total Protein 5.9 (*)    Albumin 2.6 (*)    AST 89 (*)    Alkaline Phosphatase 160 (*)    Total Bilirubin 3.2 (*)    Bilirubin, Direct 0.9 (*)    Indirect Bilirubin 2.3 (*)    All other components within normal limits  PROTIME-INR -  Abnormal; Notable for the following components:   Prothrombin Time 16.8 (*)    All other components within normal limits  COMPREHENSIVE METABOLIC PANEL  MAGNESIUM  I-STAT TROPONIN, ED  I-STAT TROPONIN, ED    EKG  EKG Interpretation  Date/Time:  Wednesday September 05 2017 11:39:01 EST Ventricular Rate:  78 PR Interval:  174 QRS Duration: 130 QT Interval:  438 QTC Calculation: 499 R Axis:     Text Interpretation:  Sinus rhythm with Premature atrial complexes Left bundle branch block Abnormal ECG When compared to prior, similar LBB with ST elevations.  No STEMI Confirmed by Antony Blackbird [phone]) on 09/05/2017 4:02:21 PM       Radiology Dg Chest 2 View  Result Date: 09/05/2017 CLINICAL DATA:  Acute shortness of breath. EXAM: CHEST  2 VIEW COMPARISON:  08/27/2017 and prior exams FINDINGS: Upper limits normal heart size and mild peribronchial thickening again noted. Minimal scarring within the mid lungs again noted. There is no evidence of focal airspace disease, pulmonary edema, suspicious pulmonary nodule/mass, pleural effusion, or pneumothorax. No acute bony abnormalities are identified. IMPRESSION: No evidence of acute cardiopulmonary disease. Electronically Signed   By: Margarette Canada M.D.   On: 09/05/2017 13:21    Procedures Procedures (including critical care time)  Medications Ordered in ED Medications  aspirin EC tablet 81 mg (81 mg Oral Given 09/05/17 2310)  LORazepam (ATIVAN) tablet 0.5 mg (not administered)  furosemide (LASIX) injection 80 mg (80 mg Intravenous Given 09/05/17 2310)  albumin human 25 % solution 25 g (25 g Intravenous New Bag/Given 09/05/17 2312)  traZODone (DESYREL) tablet 50 mg (50 mg Oral Given 09/05/17 2310)     Initial Impression / Assessment and Plan / ED Course  I have reviewed the triage vital signs and the nursing notes.  Pertinent labs & imaging results that were available during my care of the patient were reviewed by me and considered in my  medical decision making (see chart for details).     DESTYNI HOPPEL is a 83 y.o. female with a past medical history significant for prior breast cancer status post bilateral mastectomies, hyperlipidemia, prior CLL, known left bundle branch block, and cirrhosis who presents from her [facility] for further management of worsening fluid overload, exertional shortness of breath and fatigue.  According to family, patient has had a rapid regression of generalized peripheral edema for the last month.  She reports that she was started on Lasix several weeks ago and has been increasing it.  Despite this, she says that she has gained approximate 30 pounds.  She reports that she is now having exertional shortness of breath and is not able to get around.  She says that the swelling has gone from her legs to her arms bilaterally as well as a distended abdomen.  She denies abdominal pain, nausea, vomiting, constipation, diarrhea, or dysuria.  She does report urinary frequency while taking her diuretics.  She denies any chest pain, fevers, chills, congestion, or cough.  She denies any recent injuries.  Patient went to see her PCP in follow-up today and was sent to the emergency department after evaluation due to concern for worsening fluid overload leading to the respiratory symptoms.  On exam, patient has large pitting edema in both legs her entire leg.  Patient also has edema of the arms.  Patient has distention of her abdomen with no significant tenderness.  Lung exam had mild crackles in the bases.  There are no chest tenderness or back tenderness.  Patient has normal pulses in all extremities.  Normal sensation.  Initial EKG shows similar left bundle branch block.  No STEMI.  Patient is on room air walks she feels like she is short of breath.  Initial laboratory testing showed mild leukocytes of 13.  No anemia.  Urinalysis unremarkable.  Initial troponin negative.  BMP showed normal kidney function and reassuring  electrolytes.  Chest x-ray shows no acute cardia pulmonary disease however clinically there is concern for some free fluid in the lungs.  Anticipate reassessment after BNP returns.  Given documentation of prior cirrhosis, hepatic function and INR will be checked to see if distended abdomen could be due to cirrhosis with ascites.  Given the swelling and abdomen in all extremities, doubt DVT as etiology of swelling and SOB. Given the PCPs concerns for worsening fluid overload despite outpatient diuresis, suspect patient will need admission for further diuresis and assessment of cardiac function.  6:49 PM BNP slightly elevated at 140.  No previous BNP for comparison.  No anemia.  Mild leukocytosis.  Metabolic panel reassuring.  Urinalysis shows no infection.  Chest x-ray was clear.  Clinically, I am concerned about fluid overload given the exertional shortness of breath, diffuse edema, and elevated BNP.  As the patient does not have a diagnosis of CHF, I feel patient will require admission for diuresis and echo.  Hospitalist team will be called for admission.   Final Clinical Impressions(s) / ED Diagnoses   Final diagnoses:  Peripheral edema  Exertional shortness of breath  Elevated brain natriuretic peptide (BNP) level     Clinical Impression: 1. Peripheral edema   2. Exertional shortness of breath   3. Elevated brain natriuretic peptide (BNP) level     Disposition: Admit to hospitalist service    Tegeler, Gwenyth Allegra, MD 09/05/17 [phone]

## 2017-09-05 NOTE — Care Management Obs Status (Signed)
[facility] NOTIFICATION   Patient Details  Name: VALORIA TAMBURRI MRN: [id number] Date of Birth: [birth year]   Medicare Observation Status Notification Given:  Yes    Apolonio Schneiders, RN 09/05/2017, 8:39 PM

## 2017-09-06 ENCOUNTER — Other Ambulatory Visit (HOSPITAL_COMMUNITY): Payer: Self-pay | Admitting: *Deleted

## 2017-09-06 ENCOUNTER — Observation Stay (HOSPITAL_BASED_OUTPATIENT_CLINIC_OR_DEPARTMENT_OTHER): Payer: Medicare Other

## 2017-09-06 DIAGNOSIS — R7989 Other specified abnormal findings of blood chemistry: Secondary | ICD-10-CM | POA: Diagnosis not present

## 2017-09-06 DIAGNOSIS — K7460 Unspecified cirrhosis of liver: Secondary | ICD-10-CM | POA: Diagnosis present

## 2017-09-06 DIAGNOSIS — K7581 Nonalcoholic steatohepatitis (NASH): Secondary | ICD-10-CM | POA: Diagnosis present

## 2017-09-06 DIAGNOSIS — E785 Hyperlipidemia, unspecified: Secondary | ICD-10-CM | POA: Diagnosis present

## 2017-09-06 DIAGNOSIS — Z853 Personal history of malignant neoplasm of breast: Secondary | ICD-10-CM | POA: Diagnosis not present

## 2017-09-06 DIAGNOSIS — Z7982 Long term (current) use of aspirin: Secondary | ICD-10-CM | POA: Diagnosis not present

## 2017-09-06 DIAGNOSIS — F419 Anxiety disorder, unspecified: Secondary | ICD-10-CM | POA: Diagnosis present

## 2017-09-06 DIAGNOSIS — D696 Thrombocytopenia, unspecified: Secondary | ICD-10-CM | POA: Diagnosis present

## 2017-09-06 DIAGNOSIS — E8809 Other disorders of plasma-protein metabolism, not elsewhere classified: Secondary | ICD-10-CM | POA: Diagnosis present

## 2017-09-06 DIAGNOSIS — I340 Nonrheumatic mitral (valve) insufficiency: Secondary | ICD-10-CM

## 2017-09-06 DIAGNOSIS — E876 Hypokalemia: Secondary | ICD-10-CM | POA: Diagnosis present

## 2017-09-06 DIAGNOSIS — I5031 Acute diastolic (congestive) heart failure: Secondary | ICD-10-CM | POA: Diagnosis present

## 2017-09-06 DIAGNOSIS — E559 Vitamin D deficiency, unspecified: Secondary | ICD-10-CM | POA: Diagnosis present

## 2017-09-06 DIAGNOSIS — G2581 Restless legs syndrome: Secondary | ICD-10-CM | POA: Diagnosis present

## 2017-09-06 DIAGNOSIS — Z9013 Acquired absence of bilateral breasts and nipples: Secondary | ICD-10-CM | POA: Diagnosis not present

## 2017-09-06 DIAGNOSIS — Z90710 Acquired absence of both cervix and uterus: Secondary | ICD-10-CM | POA: Diagnosis not present

## 2017-09-06 DIAGNOSIS — Z66 Do not resuscitate: Secondary | ICD-10-CM | POA: Diagnosis present

## 2017-09-06 DIAGNOSIS — Z8249 Family history of ischemic heart disease and other diseases of the circulatory system: Secondary | ICD-10-CM | POA: Diagnosis not present

## 2017-09-06 DIAGNOSIS — R609 Edema, unspecified: Secondary | ICD-10-CM | POA: Diagnosis not present

## 2017-09-06 DIAGNOSIS — Z79899 Other long term (current) drug therapy: Secondary | ICD-10-CM | POA: Diagnosis not present

## 2017-09-06 DIAGNOSIS — C9110 Chronic lymphocytic leukemia of B-cell type not having achieved remission: Secondary | ICD-10-CM | POA: Diagnosis present

## 2017-09-06 DIAGNOSIS — G4700 Insomnia, unspecified: Secondary | ICD-10-CM | POA: Diagnosis present

## 2017-09-06 DIAGNOSIS — Z885 Allergy status to narcotic agent status: Secondary | ICD-10-CM | POA: Diagnosis not present

## 2017-09-06 DIAGNOSIS — I447 Left bundle-branch block, unspecified: Secondary | ICD-10-CM | POA: Diagnosis present

## 2017-09-06 DIAGNOSIS — R0602 Shortness of breath: Secondary | ICD-10-CM | POA: Diagnosis not present

## 2017-09-06 DIAGNOSIS — Z9049 Acquired absence of other specified parts of digestive tract: Secondary | ICD-10-CM | POA: Diagnosis not present

## 2017-09-06 LAB — ECHOCARDIOGRAM COMPLETE
AOASC: 30 cm
CHL CUP MV DEC (S): 239
CHL CUP MV M VEL: 469
EWDT: 239 ms
FS: 31 % (ref 28–44)
Height: 62 in
IV/PV OW: 0.98
LA vol: 52.6 mL
LADIAMINDEX: 1.9 cm/m2
LASIZE: 37 mm
LAVOLA4C: 47.8 mL
LAVOLIN: 27 mL/m2
LEFT ATRIUM END SYS DIAM: 37 mm
LV PW d: 11.4 mm — AB (ref 0.6–1.1)
LVOT area: 2.54 cm2
LVOT diameter: 18 mm
MV pk E vel: 82.5 m/s
MVANNULUSVTI: 201 cm
MVG: 98 mmHg
MVPG: 3 mmHg
MVPKAVEL: 100 m/s
Reg peak vel: 353 cm/s
TAPSE: 32.6 mm
TR max vel: 353 cm/s
Weight: 2940.8 oz

## 2017-09-06 LAB — COMPREHENSIVE METABOLIC PANEL
ALBUMIN: 2.7 g/dL — AB (ref 3.5–5.0)
ALT: 31 U/L (ref 14–54)
ANION GAP: 7 (ref 5–15)
AST: 75 U/L — AB (ref 15–41)
Alkaline Phosphatase: 129 U/L — ABNORMAL HIGH (ref 38–126)
BUN: 8 mg/dL (ref 6–20)
CHLORIDE: 104 mmol/L (ref 101–111)
CO2: 28 mmol/L (ref 22–32)
Calcium: 8.2 mg/dL — ABNORMAL LOW (ref 8.9–10.3)
Creatinine, Ser: 0.68 mg/dL (ref 0.44–1.00)
GFR calc Af Amer: >60 mL/min (ref >60)
GFR calc non Af Amer: >60 mL/min (ref >60)
GLUCOSE: 103 mg/dL — AB (ref 65–99)
POTASSIUM: 3.4 mmol/L — AB (ref 3.5–5.1)
SODIUM: 139 mmol/L (ref 135–145)
Total Bilirubin: 3.3 mg/dL — ABNORMAL HIGH (ref 0.3–1.2)
Total Protein: 5.4 g/dL — ABNORMAL LOW (ref 6.5–8.1)

## 2017-09-06 LAB — MAGNESIUM: Magnesium: 1.7 mg/dL (ref 1.7–2.4)

## 2017-09-06 MED ORDER — POTASSIUM CHLORIDE CRYS ER 20 MEQ PO TBCR
40.0000 meq | EXTENDED_RELEASE_TABLET | Freq: Once | ORAL | Status: AC
  Administered 2017-09-06: 40 meq via ORAL
  Filled 2017-09-06: qty 2

## 2017-09-06 MED ORDER — MAGNESIUM SULFATE 2 GM/50ML IV SOLN
2.0000 g | Freq: Once | INTRAVENOUS | Status: AC
  Administered 2017-09-06: 2 g via INTRAVENOUS
  Filled 2017-09-06: qty 50

## 2017-09-06 MED ORDER — POTASSIUM CHLORIDE CRYS ER 20 MEQ PO TBCR
20.0000 meq | EXTENDED_RELEASE_TABLET | Freq: Two times a day (BID) | ORAL | Status: DC
  Administered 2017-09-06 – 2017-09-08 (×5): 20 meq via ORAL
  Filled 2017-09-06 (×5): qty 1

## 2017-09-06 MED ORDER — ACETAMINOPHEN 325 MG PO TABS
650.0000 mg | ORAL_TABLET | Freq: Four times a day (QID) | ORAL | Status: DC | PRN
  Administered 2017-09-06: 650 mg via ORAL
  Filled 2017-09-06: qty 2

## 2017-09-06 NOTE — Progress Notes (Signed)
PROGRESS NOTE    Karen Richard  TDD:[id number] DOB: 1927-07-15 DOA: 09/05/2017 PCP: Maury Dus, MD  Brief Narrative:83 y.o. female with medical history significant of breast cancer, insomnia, questionable Karen Richard, osteoarthritis, chronic lower extremity swelling was sent to the hospital due to complaints of progressive weight gain and dyspnea on exertion. Patient has been dealing with chronic lower extremity swelling and edema for past several months over the course of last 30 days they have noted at least 35 pound weight gain. Patient was seen by outpatient physician who increased her Lasix to 40 mg to be taken for a few days and was tapered down to 20 mg today. Despite of this she continued to regain and has progressive swelling of her lower extremity but now also with upper extremity and abdominal swelling. Due to this higher activity and mobility has been very limited. She has been seen by Dr. Donzetta Matters from vascular 2 months ago who recommended increasing physical activity and what therapy. Patient tells me even after taking Lasix in the morning she continues to urinate throughout the day and at night but also admits of drinking lots of fluid.  In the ER patient was noted to be dyspneic even with minimal exertion. Her labs showed elevated T bilirubin at 3.2 but previously been elevated at 2.6 chronically and also thrombocytopenia which has been chronic as well. Her albumin was noted to be 2.6. She was admitted for observation for IV diuresis given her progression of her symptoms limiting even minimal ADLs.   Chest x-ray: No evidence of acute cardiopulmonary disease    Assessment & Plan:   Active Problems:   Anasarca  ANASARCA -possible acute diastolic CHF new diagnosis.  Last echocardiogram in May 2017 showed ejection fraction 55%.  Upon admission she had lower extremity edema, dyspnea on exertion.  Chest x-ray did not reveal any evidence of CHF she also had mildly elevated BNP at 140.   Echocardiogram has been ordered this admission which is pending at this time however she has responded well to IV diuresis with decreasing edema decreasing dyspnea on exertion and and she is negative by 3 L.  Her weight upon admission was 85 kg and today it is 83.4.  Unlikely hypoalbuminemia caused this amount of anasarca.  Her albumin is 2.6.  Patient also carries a history of nonalcoholic steatohepatitis with a chronically elevated bilirubin.  Her last bilirubin is 3.3 and her INR is elevated at 1.37.  Follow-up renal functions and liver functions and monitor closely avoid anY  drugs toxic to the liver.  Thrombocytopenia chronic possibly related to an NASH.  Hypokalemia replete potassium  Hypomagnesemia replete magnesium  History of breast cancer and bilateral mastectomy  Hypoalbuminemia patient has orders to receive albumin x4 doses.  DVT prophylaxis:SCD Code Status: DNR Family Communication: NONE  Disposition Plan:.HOME ONCE SHES DIURESED AND ECHO BACK. Consultants:   NONE  Procedures: None Antimicrobials: None  Subjective: Breathing seems to be better   Objective: Vitals:   09/05/17 2146 09/06/17 0010 09/06/17 0525 09/06/17 0916  BP: (!) 156/72 (!) 140/54 (!) 142/59 (!) 132/46  Pulse: (!) 103 (!) 102 98 95  Resp: 16 18 18 18   Temp: 98.3 F (36.8 C) 98 F (36.7 C) 97.7 F (36.5 C) 97.7 F (36.5 C)  TempSrc: Oral Oral Oral Oral  SpO2:  95% 98% 96%  Weight: 85 kg (187 lb 6.4 oz)  83.4 kg (183 lb 12.8 oz)   Height: 5' 2"  (1.575 m)  Intake/Output Summary (Last 24 hours) at 09/06/2017 0940 Last data filed at 09/06/2017 0857 Gross per 24 hour  Intake 200 ml  Output 3200 ml  Net -3000 ml   Filed Weights   09/05/17 2146 09/06/17 0525  Weight: 85 kg (187 lb 6.4 oz) 83.4 kg (183 lb 12.8 oz)    Examination:  General exam: Appears calm and comfortable  Respiratory system: Clear to auscultation. Respiratory effort normal.  Decreased breath sounds at the  base. Cardiovascular system: S1 & S2 heard, RRR. No JVD, murmurs, rubs, gallops or clicks. No pedal edema. Gastrointestinal system: Abdomen is nondistended, soft and nontender. No organomegaly or masses felt. Normal bowel sounds heard. Central nervous system: Alert and oriented. No focal neurological deficits. Extremities: Trace edema  psychiatry: Judgement and insight appear normal. Mood & affect appropriate.     Data Reviewed: I have personally reviewed following labs and imaging studies  CBC: Recent Labs  Lab 09/05/17 1139  WBC 13.0*  HGB 12.3  HCT 35.4*  MCV 95.9  PLT 99*   Basic Metabolic Panel: Recent Labs  Lab 09/05/17 1139 09/06/17 0359  NA 136 139  K 4.4 3.4*  CL 103 104  CO2 27 28  GLUCOSE 135* 103*  BUN 13 8  CREATININE 0.83 0.68  CALCIUM 8.7* 8.2*  MG  --  1.7   GFR: Estimated Creatinine Clearance: 48.7 mL/min (by C-G formula based on SCr of 0.68 mg/dL). Liver Function Tests: Recent Labs  Lab 09/05/17 1720 09/06/17 0359  AST 89* 75*  ALT 35 31  ALKPHOS 160* 129*  BILITOT 3.2* 3.3*  PROT 5.9* 5.4*  ALBUMIN 2.6* 2.7*   No results for input(s): LIPASE, AMYLASE in the last 168 hours. No results for input(s): AMMONIA in the last 168 hours. Coagulation Profile: Recent Labs  Lab 09/05/17 1720  INR 1.37   Cardiac Enzymes: No results for input(s): CKTOTAL, CKMB, CKMBINDEX, TROPONINI in the last 168 hours. BNP (last 3 results) No results for input(s): PROBNP in the last 8760 hours. HbA1C: No results for input(s): HGBA1C in the last 72 hours. CBG: No results for input(s): GLUCAP in the last 168 hours. Lipid Profile: No results for input(s): CHOL, HDL, LDLCALC, TRIG, CHOLHDL, LDLDIRECT in the last 72 hours. Thyroid Function Tests: No results for input(s): TSH, T4TOTAL, FREET4, T3FREE, THYROIDAB in the last 72 hours. Anemia Panel: No results for input(s): VITAMINB12, FOLATE, FERRITIN, TIBC, IRON, RETICCTPCT in the last 72 hours. Sepsis Labs: No  results for input(s): PROCALCITON, LATICACIDVEN in the last 168 hours.  No results found for this or any previous visit (from the past 240 hour(s)).       Radiology Studies: Dg Chest 2 View  Result Date: 09/05/2017 CLINICAL DATA:  Acute shortness of breath. EXAM: CHEST  2 VIEW COMPARISON:  08/27/2017 and prior exams FINDINGS: Upper limits normal heart size and mild peribronchial thickening again noted. Minimal scarring within the mid lungs again noted. There is no evidence of focal airspace disease, pulmonary edema, suspicious pulmonary nodule/mass, pleural effusion, or pneumothorax. No acute bony abnormalities are identified. IMPRESSION: No evidence of acute cardiopulmonary disease. Electronically Signed   By: Margarette Canada M.D.   On: 09/05/2017 13:21        Scheduled Meds: . aspirin EC  81 mg Oral QPM  . furosemide  80 mg Intravenous BID   Continuous Infusions: . albumin human       LOS: 0 days       Georgette Shell, MD [facility]  If 7PM-7AM, please contact night-coverage [email or web address] Password [email or web address] 09/06/2017, 9:40 AM

## 2017-09-06 NOTE — Plan of Care (Signed)
  Education: Knowledge of General Education information will improve 09/06/2017 0318 - Progressing by Anson Fret, RN Note POC and orders reviewed with pt.; explained to pt. and husband about measuring urine, Lasix IV and Albumin.

## 2017-09-06 NOTE — Progress Notes (Signed)
  Echocardiogram 2D Echocardiogram has been performed.  Elicia Lui G Marisela Line 09/06/2017, 10:46 AM

## 2017-09-07 LAB — COMPREHENSIVE METABOLIC PANEL
ALK PHOS: 117 U/L (ref 38–126)
ALT: 25 U/L (ref 14–54)
AST: 67 U/L — ABNORMAL HIGH (ref 15–41)
Albumin: 3 g/dL — ABNORMAL LOW (ref 3.5–5.0)
Anion gap: 5 (ref 5–15)
BUN: 11 mg/dL (ref 6–20)
CALCIUM: 8.6 mg/dL — AB (ref 8.9–10.3)
CHLORIDE: 104 mmol/L (ref 101–111)
CO2: 30 mmol/L (ref 22–32)
CREATININE: 0.71 mg/dL (ref 0.44–1.00)
Glucose, Bld: 111 mg/dL — ABNORMAL HIGH (ref 65–99)
Potassium: 4.1 mmol/L (ref 3.5–5.1)
Sodium: 139 mmol/L (ref 135–145)
Total Bilirubin: 3 mg/dL — ABNORMAL HIGH (ref 0.3–1.2)
Total Protein: 5.3 g/dL — ABNORMAL LOW (ref 6.5–8.1)

## 2017-09-07 LAB — CBC WITH DIFFERENTIAL/PLATELET
Basophils Absolute: 0.1 10*3/uL (ref 0.0–0.1)
Basophils Relative: 1 %
EOS PCT: 4 %
Eosinophils Absolute: 0.3 10*3/uL (ref 0.0–0.7)
HCT: 30.7 % — ABNORMAL LOW (ref 36.0–46.0)
Hemoglobin: 10.2 g/dL — ABNORMAL LOW (ref 12.0–15.0)
LYMPHS ABS: 4.2 10*3/uL — AB (ref 0.7–4.0)
LYMPHS PCT: 61 %
MCH: 32.4 pg (ref 26.0–34.0)
MCHC: 33.2 g/dL (ref 30.0–36.0)
MCV: 97.5 fL (ref 78.0–100.0)
MONOS PCT: 5 %
Monocytes Absolute: 0.4 10*3/uL (ref 0.1–1.0)
Neutro Abs: 2.1 10*3/uL (ref 1.7–7.7)
Neutrophils Relative %: 30 %
PLATELETS: 85 10*3/uL — AB (ref 150–400)
RBC: 3.15 MIL/uL — AB (ref 3.87–5.11)
RDW: 17.3 % — ABNORMAL HIGH (ref 11.5–15.5)
WBC: 7 10*3/uL (ref 4.0–10.5)

## 2017-09-07 NOTE — Progress Notes (Signed)
PROGRESS NOTE    Karen Richard  BTD:[id number] DOB: 25-Nov-1926 DOA: 09/05/2017 PCP: Maury Dus, MD   Brief Narrative:83 y.o.femalewith medical history significant ofbreast cancer, insomnia, questionable Nash, osteoarthritis, chronic lower extremity swelling was sent to the hospital due to complaints of progressive weight gain and dyspnea on exertion. Patient has been dealing with chronic lower extremity swelling and edema for past several months over the course of last 30 days they have noted at least 35 pound weight gain. Patient was seen by outpatient physician who increased her Lasix to 40 mg to be taken for a few days and was tapered down to 20 mg today. Despite of this she continued to regain and has progressive swelling of her lower extremity but now also with upper extremity and abdominal swelling. Due to this higher activity and mobility has been very limited. She has been seen by Dr. Donzetta Matters from vascular 2 months ago who recommended increasing physical activity and what therapy. Patient tells me even after taking Lasix in the morning she continues to urinate throughout the day and at night but also admits of drinking lots of fluid.  In the ER patient was noted to be dyspneic even with minimal exertion. Her labs showed elevated T bilirubin at3.2but previously been elevated at 2.6 chronically and also thrombocytopenia which has been chronic as well. Her albumin was noted to be 2.6. She was admitted for observation for IV diuresis given her progression of her symptoms limitingeven minimal ADLs.  Echo normal ef,grade 1 diastolic dysfunction.   Assessment & Plan:   Active Problems:   Anasarca   Anasarca still with significant fluid overload.continue iv lasix.hope to change to po lasix tomorrow. Thrombocytopenia chronic possibly related to an NASH.  Hypokalemia replete potassium  Hypomagnesemia replete magnesium  History of breast cancer and bilateral  mastectomy  Hypoalbuminemia patient has orders to receive albumin x4 doses.    DVT prophylaxis:  scd Code Status dnr : Family Communication:  dw husband Disposition Plan: home soon  Consultants: none   Procedures:none  Antimicrobials:none Subjective:feeling better   Objective: Vitals:   09/06/17 0916 09/06/17 1100 09/06/17 2145 09/07/17 0612  BP: (!) 132/46 (!) 142/57 (!) 148/44 (!) 140/46  Pulse: 95 85 91 89  Resp: 18 18 16 17   Temp: 97.7 F (36.5 C) 98.1 F (36.7 C)  98.4 F (36.9 C)  TempSrc: Oral Oral  Oral  SpO2: 96% 98% 97% 96%  Weight:    80.8 kg (178 lb 1.6 oz)  Height:        Intake/Output Summary (Last 24 hours) at 09/07/2017 1436 Last data filed at 09/07/2017 1302 Gross per 24 hour  Intake -  Output 2850 ml  Net -2850 ml   Filed Weights   09/05/17 2146 09/06/17 0525 09/07/17 0612  Weight: 85 kg (187 lb 6.4 oz) 83.4 kg (183 lb 12.8 oz) 80.8 kg (178 lb 1.6 oz)    Examination:  General exam: Appears calm and comfortable  Respiratory system: Clear to auscultation. Respiratory effort normal. Cardiovascular system: S1 & S2 heard, RRR. No JVD, murmurs, rubs, gallops or clicks. No pedal edema. Gastrointestinal system: Abdomen is nondistended, soft and nontender. No organomegaly or masses felt. Normal bowel sounds heard. Central nervous system: Alert and oriented. No focal neurological deficits. Extremities:2 plus edema Skin: No rashes, lesions or ulcers Psychiatry: Judgement and insight appear normal. Mood & affect appropriate.     Data Reviewed: I have personally reviewed following labs and imaging studies  CBC: Recent Labs  Lab 09/05/17 1139 09/07/17 0510  WBC 13.0* 7.0  NEUTROABS  --  2.1  HGB 12.3 10.2*  HCT 35.4* 30.7*  MCV 95.9 97.5  PLT 99* 85*   Basic Metabolic Panel: Recent Labs  Lab 09/05/17 1139 09/06/17 0359 09/07/17 0510  NA 136 139 139  K 4.4 3.4* 4.1  CL 103 104 104  CO2 27 28 30   GLUCOSE 135* 103* 111*  BUN 13 8  11   CREATININE 0.83 0.68 0.71  CALCIUM 8.7* 8.2* 8.6*  MG  --  1.7  --    GFR: Estimated Creatinine Clearance: 47.9 mL/min (by C-G formula based on SCr of 0.71 mg/dL). Liver Function Tests: Recent Labs  Lab 09/05/17 1720 09/06/17 0359 09/07/17 0510  AST 89* 75* 67*  ALT 35 31 25  ALKPHOS 160* 129* 117  BILITOT 3.2* 3.3* 3.0*  PROT 5.9* 5.4* 5.3*  ALBUMIN 2.6* 2.7* 3.0*   No results for input(s): LIPASE, AMYLASE in the last 168 hours. No results for input(s): AMMONIA in the last 168 hours. Coagulation Profile: Recent Labs  Lab 09/05/17 1720  INR 1.37   Cardiac Enzymes: No results for input(s): CKTOTAL, CKMB, CKMBINDEX, TROPONINI in the last 168 hours. BNP (last 3 results) No results for input(s): PROBNP in the last 8760 hours. HbA1C: No results for input(s): HGBA1C in the last 72 hours. CBG: No results for input(s): GLUCAP in the last 168 hours. Lipid Profile: No results for input(s): CHOL, HDL, LDLCALC, TRIG, CHOLHDL, LDLDIRECT in the last 72 hours. Thyroid Function Tests: No results for input(s): TSH, T4TOTAL, FREET4, T3FREE, THYROIDAB in the last 72 hours. Anemia Panel: No results for input(s): VITAMINB12, FOLATE, FERRITIN, TIBC, IRON, RETICCTPCT in the last 72 hours. Sepsis Labs: No results for input(s): PROCALCITON, LATICACIDVEN in the last 168 hours.  No results found for this or any previous visit (from the past 240 hour(s)).       Radiology Studies: No results found.      Scheduled Meds: . aspirin EC  81 mg Oral QPM  . furosemide  80 mg Intravenous BID  . potassium chloride  20 mEq Oral BID   Continuous Infusions:   LOS: 1 day        Georgette Shell, MD [facility]  If 7PM-7AM, please contact night-coverage [email or web address] Password [email or web address] 09/07/2017, 2:36 PM

## 2017-09-07 NOTE — Evaluation (Signed)
Physical Therapy Evaluation Patient Details Name: Karen Richard MRN: [id number] DOB: 10-23-1927 Today's Date: 09/07/2017   History of Present Illness  Pt is an 83 y.o. female admitted on 09/05/17 with anasarca and DOE. Pertinent PMH includes breast CA s/p bil mastectomy, insomnia, questionable Nash, OA, chronic BLE swelling.     Clinical Impression  Patient evaluated by Physical Therapy with no further acute PT needs identified. Pt resides at [place] with her husband. Pt currently indep with mobility. Husband present throughout session and very supportive. Discussed recommendations for aerobic and strengthening exercises upon return home. All education has been completed and the patient has no further questions. PT is signing off. Thank you for this referral.    Follow Up Recommendations No PT follow up    Equipment Recommendations  None recommended by PT    Recommendations for Other Services       Precautions / Restrictions Precautions Precautions: None Restrictions Weight Bearing Restrictions: No      Mobility  Bed Mobility Overal bed mobility: Independent                Transfers Overall transfer level: Independent Equipment used: None                Ambulation/Gait Ambulation/Gait assistance: Independent Ambulation Distance (Feet): 250 Feet Assistive device: None Gait Pattern/deviations: Step-through pattern;Decreased stride length Gait velocity: Decreased   General Gait Details: Slow, controlled gait with no overt instability noted. Pt reports BLE heaviness secondary to swelling. Denies SOB  Stairs            Wheelchair Mobility    Modified Rankin (Stroke Patients Only)       Balance Overall balance assessment: Needs assistance   Sitting balance-Leahy Scale: Good       Standing balance-Leahy Scale: Good               High level balance activites: Side stepping;Backward walking;Direction  changes;Turns;Head turns               Pertinent Vitals/Pain Pain Assessment: No/denies pain    Home Living Family/patient expects to be discharged to:: Private residence Living Arrangements: Spouse/significant other Available Help at Discharge: Family;Available 24 hours/day(Husband) Type of Home: Independent living facility Home Access: Elevator     Home Layout: One level Home Equipment: [place] - 2 wheels;Kasandra Knudsen - single point Additional Comments: Lives in main building (apartment-style) at [facility]    Prior Function Level of Independence: Independent               Journalist, newspaper        Extremity/Trunk Assessment   Upper Extremity Assessment Upper Extremity Assessment: Overall WFL for tasks assessed    Lower Extremity Assessment Lower Extremity Assessment: LLE deficits/detail;RLE deficits/detail RLE Deficits / Details: RLE grossly 4/5; increased swelling throughout LLE Deficits / Details: LLE grossly 4/5; increased swelling throughout    Cervical / Trunk Assessment Cervical / Trunk Assessment: Normal  Communication   Communication: No difficulties  Cognition Arousal/Alertness: Awake/alert Behavior During Therapy: WFL for tasks assessed/performed Overall Cognitive Status: Within Functional Limits for tasks assessed                                        General Comments      Exercises     Assessment/Plan    PT Assessment Patent does not need any further PT  services  PT Problem List         PT Treatment Interventions      PT Goals (Current goals can be found in the Care Plan section)  Acute Rehab PT Goals PT Goal Formulation: All assessment and education complete, DC therapy    Frequency     Barriers to discharge        Co-evaluation               AM-PAC PT "6 Clicks" Daily Activity  Outcome Measure Difficulty turning over in bed (including adjusting bedclothes, sheets and blankets)?:  None Difficulty moving from lying on back to sitting on the side of the bed? : None Difficulty sitting down on and standing up from a chair with arms (e.g., wheelchair, bedside commode, etc,.)?: None Help needed moving to and from a bed to chair (including a wheelchair)?: None Help needed walking in hospital room?: None Help needed climbing 3-5 steps with a railing? : A Little 6 Click Score: 23    End of Session Equipment Utilized During Treatment: Gait belt Activity Tolerance: Patient tolerated treatment well Patient left: in bed;with call bell/phone within reach;with family/visitor present Nurse Communication: Mobility status PT Visit Diagnosis: Other abnormalities of gait and mobility (R26.89)    Time: 1017-5102 PT Time Calculation (min) (ACUTE ONLY): 27 min   Charges:   PT Evaluation $PT Eval Low Complexity: 1 Low PT Treatments $Gait Training: 8-22 mins   PT G Codes:   PT G-Codes **NOT FOR INPATIENT CLASS** Functional Assessment Tool Used: AM-PAC 6 Clicks Basic Mobility Functional Limitation: Mobility: Walking and moving around Mobility: Walking and Moving Around Current Status ([id number]): At least 1 percent but less than 20 percent impaired, limited or restricted Mobility: Walking and Moving Around Goal Status [phone]): At least 1 percent but less than 20 percent impaired, limited or restricted Mobility: Walking and Moving Around Discharge Status [phone]): At least 1 percent but less than 20 percent impaired, limited or restricted   Mabeline Caras, PT, DPT Acute Rehab Services  Pager: [place] 09/07/2017, 4:17 PM

## 2017-09-08 DIAGNOSIS — R7989 Other specified abnormal findings of blood chemistry: Secondary | ICD-10-CM

## 2017-09-08 LAB — BASIC METABOLIC PANEL
ANION GAP: 7 (ref 5–15)
BUN: 13 mg/dL (ref 6–20)
CHLORIDE: 102 mmol/L (ref 101–111)
CO2: 29 mmol/L (ref 22–32)
Calcium: 8.9 mg/dL (ref 8.9–10.3)
Creatinine, Ser: 0.74 mg/dL (ref 0.44–1.00)
GFR calc Af Amer: >60 mL/min (ref >60)
GLUCOSE: 99 mg/dL (ref 65–99)
POTASSIUM: 4.5 mmol/L (ref 3.5–5.1)
Sodium: 138 mmol/L (ref 135–145)

## 2017-09-08 MED ORDER — FUROSEMIDE 20 MG PO TABS: 40.0000 mg | ORAL_TABLET | Freq: Two times a day (BID) | ORAL | 1 refills | Status: DC

## 2017-09-08 MED ORDER — POTASSIUM CHLORIDE CRYS ER 20 MEQ PO TBCR: 20.0000 meq | EXTENDED_RELEASE_TABLET | Freq: Two times a day (BID) | ORAL | 1 refills | Status: DC

## 2017-09-08 NOTE — Care Management Note (Signed)
Case Management Note  Patient Details  Name: Karen Richard MRN: [id number] Date of Birth: November 12, 1926  Subjective/Objective:                 Patient will return to [facility] at [place] provided by CSW if needed. CM signing off.    Action/Plan:   Expected Discharge Date:  09/08/17               Expected Discharge Plan:  Home/Self Care  In-House Referral:  Clinical Social Work  Discharge planning Services  CM Consult  Post Acute Care Choice:    Choice offered to:     DME Arranged:    DME Agency:     HH Arranged:    HH Agency:     Status of Service:  Completed, signed off  If discussed at [employer], dates discussed:    Additional Comments:  Carles Collet, RN 09/08/2017, 8:29 AM

## 2017-09-08 NOTE — Progress Notes (Signed)
Patient given discharge instructions and all questions answered.  

## 2017-09-08 NOTE — Discharge Summary (Signed)
Physician Discharge Summary  Karen Richard SWF:[id number] DOB: 1927/08/24 DOA: 09/05/2017  PCP: Maury Dus, MD  Admit date: 09/05/2017 Discharge date: 09/08/2017  [facility]  Recommendations for Outpatient Follow-up:  1. Follow up with PCP in 1-2 weeks 2. Please obtain BMP/CBC in one week  Home Health none Equipment/Devicenone  Discharge Conditionstable CODE STATUS:full Diet recommendation:cardiac Brief/Interim Summary:83 y.o.femalewith medical history significant ofbreast cancer, insomnia, questionable Nash, osteoarthritis, chronic lower extremity swelling was sent to the hospital due to complaints of progressive weight gain and dyspnea on exertion. Patient has been dealing with chronic lower extremity swelling and edema for past several months over the course of last 30 days they have noted at least 35 pound weight gain. Patient was seen by outpatient physician who increased her Lasix to 40 mg to be taken for a few days and was tapered down to 20 mg today. Despite of this she continued to regain and has progressive swelling of her lower extremity but now also with upper extremity and abdominal swelling. Due to this higher activity and mobility has been very limited. She has been seen by Dr. Donzetta Matters from vascular 2 months ago who recommended increasing physical activity and what therapy. Patient tells me even after taking Lasix in the morning she continues to urinate throughout the day and at night but also admits of drinking lots of fluid.  In the ER patient was noted to be dyspneic even with minimal exertion. Her labs showed elevated T bilirubin at3.2but previously been elevated at 2.6 chronically and also thrombocytopenia which has been chronic as well. Her albumin was noted to be 2.6. She was admitted for observation for IV diuresis given her progression of her symptoms limitingeven minimal ADLs.  Echo normal ef,grade 1 diastolic  dysfunction.     Discharge Diagnoses:  Active Problems:   Anasarca  Acute diastolic chf -dc iv lasix.change to po.dc later today.  Discharge Instructions follow up with dr Inda Castle for bmp within one week.   Allergies as of 09/08/2017      Reactions   Codeine Nausea Only   Darvocet [propoxyphene N-acetaminophen] Nausea Only      Medication List    STOP taking these medications   amoxicillin 500 MG capsule Commonly known as:  AMOXIL   ibuprofen 200 MG tablet Commonly known as:  ADVIL,MOTRIN   mupirocin ointment 2 % Commonly known as:  BACTROBAN   SHINGRIX injection Generic drug:  Zoster Vaccine Adjuvanted     TAKE these medications   aspirin EC 81 MG EC tablet Generic drug:  aspirin Take 81 mg by mouth every evening.   furosemide 20 MG tablet Commonly known as:  LASIX Take 2 tablets (40 mg total) 2 (two) times daily by mouth. What changed:  when to take this   LORazepam 0.5 MG tablet Commonly known as:  ATIVAN TAKE 1 TABLET BY MOUTH EVERY DAY AS NEEDED FOR ANXIETY   potassium chloride SA 20 MEQ tablet Commonly known as:  K-DUR,KLOR-CON Take 1 tablet (20 mEq total) 2 (two) times daily by mouth.   TYLENOL ARTHRITIS EXT RELIEF PO Take 1 tablet as needed by mouth.   Vitamin D (Ergocalciferol) 50000 units Caps capsule Commonly known as:  DRISDOL TAKE 1 CAPSULE (50,000 UNITS TOTAL) BY MOUTH ONCE A WEEK. What changed:  additional instructions       Allergies  Allergen Reactions  . Codeine Nausea Only  . Darvocet [Propoxyphene N-Acetaminophen] Nausea Only    Consultations:none   Procedures/Studies:  Dg Chest 2 View  Result Date: 09/05/2017 CLINICAL DATA:  Acute shortness of breath. EXAM: CHEST  2 VIEW COMPARISON:  08/27/2017 and prior exams FINDINGS: Upper limits normal heart size and mild peribronchial thickening again noted. Minimal scarring within the mid lungs again noted. There is no evidence of focal airspace disease, pulmonary edema, suspicious  pulmonary nodule/mass, pleural effusion, or pneumothorax. No acute bony abnormalities are identified. IMPRESSION: No evidence of acute cardiopulmonary disease. Electronically Signed   By: Margarette Canada M.D.   On: 09/05/2017 13:21   Dg Chest 2 View  Result Date: 08/27/2017 CLINICAL DATA:  Cough for 2 weeks. EXAM: CHEST  2 VIEW COMPARISON:  05/20/2015 FINDINGS: The heart size and mediastinal contours are within normal limits. Mild scarring in the left lung. There is no evidence of pulmonary edema, consolidation, pneumothorax, nodule or pleural fluid. The visualized skeletal structures are unremarkable. IMPRESSION: No active cardiopulmonary disease. Electronically Signed   By: Aletta Edouard M.D.   On: 08/27/2017 10:50    (Echo, Carotid, EGD, Colonoscopy, ERCP)    Subjective:   Discharge Exam: Vitals:   09/07/17 2059 09/08/17 0545  BP: (!) 133/42 (!) 126/51  Pulse: 91 88  Resp: 17 18  Temp: 98.2 F (36.8 C) 98 F (36.7 C)  SpO2: 96% 94%   Vitals:   09/07/17 0612 09/07/17 1300 09/07/17 2059 09/08/17 0545  BP: (!) 140/46 136/64 (!) 133/42 (!) 126/51  Pulse: 89  91 88  Resp: 17  17 18   Temp: 98.4 F (36.9 C) 98 F (36.7 C) 98.2 F (36.8 C) 98 F (36.7 C)  TempSrc: Oral  Oral Oral  SpO2: 96% 97% 96% 94%  Weight: 80.8 kg (178 lb 1.6 oz)   79.3 kg (174 lb 14.4 oz)  Height:        General: Pt is alert, awake, not in acute distress Cardiovascular: RRR, S1/S2 +, no rubs, no gallops Respiratory: CTA bilaterally, no wheezing, no rhonchi Abdominal: Soft, NT, ND, bowel sounds + Extremities: no edema, no cyanosis    The results of significant diagnostics from this hospitalization (including imaging, microbiology, ancillary and laboratory) are listed below for reference.     Microbiology: No results found for this or any previous visit (from the past 240 hour(s)).   Labs: BNP (last 3 results) Recent Labs    09/05/17 1139  BNP 001.7*   Basic Metabolic Panel: Recent Labs   Lab 09/05/17 1139 09/06/17 0359 09/07/17 0510 09/08/17 0518  NA 136 139 139 138  K 4.4 3.4* 4.1 4.5  CL 103 104 104 102  CO2 27 28 30 29   GLUCOSE 135* 103* 111* 99  BUN 13 8 11 13   CREATININE 0.83 0.68 0.71 0.74  CALCIUM 8.7* 8.2* 8.6* 8.9  MG  --  1.7  --   --    Liver Function Tests: Recent Labs  Lab 09/05/17 1720 09/06/17 0359 09/07/17 0510  AST 89* 75* 67*  ALT 35 31 25  ALKPHOS 160* 129* 117  BILITOT 3.2* 3.3* 3.0*  PROT 5.9* 5.4* 5.3*  ALBUMIN 2.6* 2.7* 3.0*   No results for input(s): LIPASE, AMYLASE in the last 168 hours. No results for input(s): AMMONIA in the last 168 hours. CBC: Recent Labs  Lab 09/05/17 1139 09/07/17 0510  WBC 13.0* 7.0  NEUTROABS  --  2.1  HGB 12.3 10.2*  HCT 35.4* 30.7*  MCV 95.9 97.5  PLT 99* 85*   Cardiac Enzymes: No results for input(s): CKTOTAL, CKMB, CKMBINDEX, TROPONINI in the  last 168 hours. BNP: Invalid input(s): POCBNP CBG: No results for input(s): GLUCAP in the last 168 hours. D-Dimer No results for input(s): DDIMER in the last 72 hours. Hgb A1c No results for input(s): HGBA1C in the last 72 hours. Lipid Profile No results for input(s): CHOL, HDL, LDLCALC, TRIG, CHOLHDL, LDLDIRECT in the last 72 hours. Thyroid function studies No results for input(s): TSH, T4TOTAL, T3FREE, THYROIDAB in the last 72 hours.  Invalid input(s): FREET3 Anemia work up No results for input(s): VITAMINB12, FOLATE, FERRITIN, TIBC, IRON, RETICCTPCT in the last 72 hours. Urinalysis    Component Value Date/Time   COLORURINE YELLOW 09/05/2017 1155   APPEARANCEUR CLEAR 09/05/2017 1155   LABSPEC 1.004 (L) 09/05/2017 1155   PHURINE 5.0 09/05/2017 1155   GLUCOSEU NEGATIVE 09/05/2017 1155   HGBUR NEGATIVE 09/05/2017 1155   HGBUR negative 02/01/2010 1012   BILIRUBINUR NEGATIVE 09/05/2017 1155   BILIRUBINUR neg 01/11/2011 1449   KETONESUR NEGATIVE 09/05/2017 1155   PROTEINUR NEGATIVE 09/05/2017 1155   UROBILINOGEN 0.2 01/11/2011 1449    UROBILINOGEN 0.2 02/01/2010 1012   NITRITE NEGATIVE 09/05/2017 1155   LEUKOCYTESUR NEGATIVE 09/05/2017 1155   Sepsis Labs Invalid input(s): PROCALCITONIN,  WBC,  LACTICIDVEN Microbiology No results found for this or any previous visit (from the past 240 hour(s)).   Time coordinating discharge: Over 30 minutes  SIGNED:   Georgette Shell, MD  [facility] 09/08/2017, 9:13 AM Pager   If 7PM-7AM, please contact night-coverage [email or web address] Password [email or web address]

## 2017-09-24 ENCOUNTER — Other Ambulatory Visit: Payer: Self-pay | Admitting: Family Medicine

## 2017-09-24 ENCOUNTER — Ambulatory Visit
Admission: RE | Admit: 2017-09-24 | Discharge: 2017-09-24 | Disposition: A | Discharge status: Home / Self Care | Payer: Medicare Other | Source: Ambulatory Visit | Attending: Family Medicine | Admitting: Family Medicine

## 2017-09-24 DIAGNOSIS — M545 Low back pain, unspecified: Secondary | ICD-10-CM

## 2017-10-09 ENCOUNTER — Ambulatory Visit: Payer: Medicare Other | Admitting: Hematology & Oncology

## 2017-10-09 ENCOUNTER — Other Ambulatory Visit: Payer: Medicare Other

## 2017-10-29 ENCOUNTER — Ambulatory Visit (HOSPITAL_BASED_OUTPATIENT_CLINIC_OR_DEPARTMENT_OTHER): Payer: Medicare Other | Admitting: Hematology & Oncology

## 2017-10-29 ENCOUNTER — Other Ambulatory Visit: Payer: Medicare Other

## 2017-10-29 ENCOUNTER — Other Ambulatory Visit: Payer: Self-pay

## 2017-10-29 ENCOUNTER — Encounter: Payer: Self-pay | Admitting: Hematology & Oncology

## 2017-10-29 VITALS — BP 140/50 | HR 95 | Temp 97.9°F | Resp 18 | Wt 174.4 lb

## 2017-10-29 DIAGNOSIS — C50012 Malignant neoplasm of nipple and areola, left female breast: Secondary | ICD-10-CM

## 2017-10-29 DIAGNOSIS — R7989 Other specified abnormal findings of blood chemistry: Secondary | ICD-10-CM

## 2017-10-29 DIAGNOSIS — C9110 Chronic lymphocytic leukemia of B-cell type not having achieved remission: Secondary | ICD-10-CM

## 2017-10-29 DIAGNOSIS — M7989 Other specified soft tissue disorders: Secondary | ICD-10-CM | POA: Diagnosis not present

## 2017-10-29 DIAGNOSIS — K7581 Nonalcoholic steatohepatitis (NASH): Secondary | ICD-10-CM | POA: Diagnosis not present

## 2017-10-29 DIAGNOSIS — Z853 Personal history of malignant neoplasm of breast: Secondary | ICD-10-CM

## 2017-10-29 DIAGNOSIS — K7030 Alcoholic cirrhosis of liver without ascites: Secondary | ICD-10-CM

## 2017-10-29 DIAGNOSIS — K7460 Unspecified cirrhosis of liver: Secondary | ICD-10-CM

## 2017-10-29 DIAGNOSIS — I890 Lymphedema, not elsewhere classified: Secondary | ICD-10-CM

## 2017-10-29 DIAGNOSIS — C50011 Malignant neoplasm of nipple and areola, right female breast: Secondary | ICD-10-CM

## 2017-10-29 LAB — CMP (CANCER CENTER ONLY)
ALBUMIN: 2.7 g/dL — AB (ref 3.3–5.5)
ALK PHOS: 229 U/L — AB (ref 26–84)
ALT: 33 U/L (ref 10–47)
AST: 73 U/L — ABNORMAL HIGH (ref 11–38)
BUN: 15 mg/dL (ref 7–22)
CALCIUM: 9.5 mg/dL (ref 8.0–10.3)
CHLORIDE: 103 meq/L (ref 98–108)
CO2: 28 mEq/L (ref 18–33)
Creat: 0.9 mg/dl (ref 0.6–1.2)
Glucose, Bld: 132 mg/dL — ABNORMAL HIGH (ref 73–118)
POTASSIUM: 4 meq/L (ref 3.3–4.7)
Sodium: 140 mEq/L (ref 128–145)
TOTAL PROTEIN: 5.8 g/dL — AB (ref 6.4–8.1)
Total Bilirubin: 2.5 mg/dl — ABNORMAL HIGH (ref 0.20–1.60)

## 2017-10-29 LAB — MANUAL DIFFERENTIAL (CHCC SATELLITE)
ALC: 7.7 10*3/uL — AB (ref 0.6–2.2)
ANC (CHCC MAN DIFF): 4.3 10*3/uL (ref 1.5–6.7)
BASO: 2 % (ref 0–2)
EOS: 2 % (ref 0–7)
LYMPH: 57 % — ABNORMAL HIGH (ref 14–48)
MONO: 7 % (ref 0–13)
PLT EST ~~LOC~~: DECREASED
SEG: 32 % — ABNORMAL LOW (ref 40–75)

## 2017-10-29 LAB — CBC WITH DIFFERENTIAL (CANCER CENTER ONLY)
HEMATOCRIT: 35.7 % (ref 34.8–46.6)
HEMOGLOBIN: 12.7 g/dL (ref 11.6–15.9)
MCH: 35 pg — AB (ref 26.0–34.0)
MCHC: 35.6 g/dL (ref 32.0–36.0)
MCV: 98 fL (ref 81–101)
PLATELETS: 98 10*3/uL — AB (ref 145–400)
RBC: 3.63 10*6/uL — AB (ref 3.70–5.32)
RDW: 17.3 % — ABNORMAL HIGH (ref 11.1–15.7)
WBC: 13.5 10*3/uL — AB (ref 3.9–10.0)

## 2017-10-29 LAB — PROTIME-INR (CHCC SATELLITE)
INR: 1.2 — AB (ref 2.0–3.5)
PROTIME: 14.4 s — AB (ref 10.6–13.4)

## 2017-10-29 LAB — LACTATE DEHYDROGENASE: LDH: 309 U/L — AB (ref 125–245)

## 2017-10-29 MED ORDER — TRAMADOL HCL 50 MG PO TABS: 25.0000 mg | ORAL_TABLET | Freq: Four times a day (QID) | ORAL | 0 refills | Status: DC | PRN

## 2017-10-29 MED FILL — traMADol HCL 50 MG TABS: 50 | 15 days supply | Qty: 30 | Fill #0

## 2017-10-29 NOTE — Progress Notes (Signed)
Hematology and Oncology Follow Up Visit  Karen Richard [id number] October 19, 1927 83 y.o. 10/29/2017   Principle Diagnosis:  1. Stage A CLL. 2. 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 3. History of stage I (T1 N0 M0) ductal carcinoma of the left breast,     1999. 4. Cirrhosis due to steatohepatitis  Current Therapy:   Observation   Interim History:  Karen Richard is here today with her husband for follow-up.  He still has some problems with leg swelling.  She apparently was hospitalized back in November.  She has gained about 25 pounds.  She was put on aggressive diuretics.  She is still has swelling in her legs.  I really think that this is from her having cirrhosis.  She has a low albumin of only 2.7.  As far as her breast cancer is concerned, this really has not been much of an issue.  She also has CLL.  This is been early stage.  She has had no rashes.  She has had no bleeding.  There has been no change in bowel or bladder habits.  She has had no fever.  She has had no headache.  ECOG Performance Status: 1 - Symptomatic but completely ambulatory  Medications:  Allergies as of 10/29/2017      Reactions   Codeine Nausea Only   Darvocet [propoxyphene N-acetaminophen] Nausea Only      Medication List        Accurate as of 10/29/17 12:11 PM. Always use your most recent med list.          aspirin EC 81 MG EC tablet Generic drug:  aspirin Take 81 mg by mouth every evening.   furosemide 20 MG tablet Commonly known as:  LASIX Take 2 tablets (40 mg total) 2 (two) times daily by mouth.   LORazepam 0.5 MG tablet Commonly known as:  ATIVAN TAKE 1 TABLET BY MOUTH EVERY DAY AS NEEDED FOR ANXIETY   spironolactone 25 MG tablet Commonly known as:  ALDACTONE Take 25 mg by mouth daily.   TYLENOL ARTHRITIS EXT RELIEF PO Take 1 tablet as needed by mouth.   Vitamin D (Ergocalciferol) 50000 units Caps capsule Commonly known as:  DRISDOL TAKE 1 CAPSULE  (50,000 UNITS TOTAL) BY MOUTH ONCE A WEEK.       Allergies:  Allergies  Allergen Reactions  . Codeine Nausea Only  . Darvocet [Propoxyphene N-Acetaminophen] Nausea Only    Past Medical History, Surgical history, Social history, and Family History were reviewed and updated.  Review of Systems: As stated in the interim history  Physical Exam:  weight is 174 lb 6.4 oz (79.1 kg). Her oral temperature is 97.9 F (36.6 C). Her blood pressure is 140/50 (abnormal) and her pulse is 95. Her respiration is 18 and oxygen saturation is 98%.   Wt Readings from Last 3 Encounters:  10/29/17 174 lb 6.4 oz (79.1 kg)  09/08/17 174 lb 14.4 oz (79.3 kg)  07/11/17 179 lb (81.2 kg)    Physical Exam  Constitutional: She is oriented to person, place, and time.  HENT:  Head: Normocephalic and atraumatic.  Mouth/Throat: Oropharynx is clear and moist.  Eyes: EOM are normal. Pupils are equal, round, and reactive to light.  Neck: Normal range of motion.  Cardiovascular: Normal rate, regular rhythm and normal heart sounds.  Pulmonary/Chest: Effort normal and breath sounds normal.  Abdominal: Soft. Bowel sounds are normal.  Musculoskeletal: Normal range of motion. She exhibits no edema, tenderness  or deformity.  Lymphadenopathy:    She has no cervical adenopathy.  Neurological: She is alert and oriented to person, place, and time.  Skin: Skin is warm and dry. No rash noted. No erythema.  Psychiatric: She has a normal mood and affect. Her behavior is normal. Judgment and thought content normal.  Vitals reviewed.    Lab Results  Component Value Date   WBC 13.5 (H) 10/29/2017   HGB 12.7 10/29/2017   HCT 35.7 10/29/2017   MCV 98 10/29/2017   PLT 98 (L) 10/29/2017   Lab Results  Component Value Date   FERRITIN 358 (H) 02/07/2016   IRON 176 (H) 02/07/2016   TIBC 246 02/07/2016   UIBC 70 (L) 02/07/2016   IRONPCTSAT 72 (H) 02/07/2016   Lab Results  Component Value Date   RBC 3.63 (L)  10/29/2017   No results found for: [facility] Lab Results  Component Value Date   IGGSERUM 1,560 08/10/2014   IGA 222 08/10/2014   IGMSERUM 59 08/10/2014   Lab Results  Component Value Date   TOTALPROTELP 6.0 08/10/2014   ALBUMINELP 53.0 (L) 08/10/2014   A1GS 3.4 08/10/2014   A2GS 9.1 08/10/2014   BETS 6.3 08/10/2014   BETA2SER 4.1 08/10/2014   GAMS 24.1 (H) 08/10/2014   MSPIKE NOT DET 08/10/2014   SPEI * 08/10/2014     Chemistry      Component Value Date/Time   NA 140 10/29/2017 1105   NA 138 02/09/2017 0851   K 4.0 10/29/2017 1105   K 4.1 02/09/2017 0851   CL 103 10/29/2017 1105   CL 109 (H) 02/17/2013 1037   CO2 28 10/29/2017 1105   CO2 26 02/09/2017 0851   BUN 15 10/29/2017 1105   BUN 11.4 02/09/2017 0851   CREATININE 0.9 10/29/2017 1105   CREATININE 0.7 02/09/2017 0851      Component Value Date/Time   CALCIUM 9.5 10/29/2017 1105   CALCIUM 9.1 02/09/2017 0851   ALKPHOS 229 (H) 10/29/2017 1105   ALKPHOS 210 (H) 02/09/2017 0851   AST 73 (H) 10/29/2017 1105   AST 83 (H) 02/09/2017 0851   ALT 33 10/29/2017 1105   ALT 35 02/09/2017 0851   BILITOT 2.50 (H) 10/29/2017 1105   BILITOT 2.36 (H) 02/09/2017 0851      Impression and Plan: Karen Richard is a very pleasant 83 yo caucasian female stage A CLL. Her counts today remain stable. No anemia. No bruising or episodes of bleeding.   LFT's are still elevated with her history of NASH.   She has the leg swelling.  I do not think anything will really help with this.  Again I suspect this is from her cirrhosis.  I offered to give her some albumin.  This along with some IV Lasix might help get some of the water out of her legs.  This may help her make her feel better.  She will think about this.  I told her that she really cannot take Tylenol.  I think with her cirrhosis, even low-dose Tylenol might be tough.  I gave her a prescription for Ultram to take as needed.  She also has history of  bilateral breast cancer. Exam today was negative.   She is doing well and her only complaint at this time is the leg swelling and it effect on her mobility and ability to travel.   We will plan to see her back again in another 3 months for repeat labs and follow-up.  She will contact our office with any questions or concerns. We can certainly see her sooner if need be.   Volanda Napoleon, MD 12/31/201812:11 PM

## 2017-10-31 ENCOUNTER — Telehealth: Payer: Self-pay | Admitting: *Deleted

## 2017-10-31 NOTE — Telephone Encounter (Signed)
Patient was prescribed Ultram. She states she has read about the medication and will not take it. It "has three pages of side effects and states it's habit forming". She wants the medication removed from her medication list.   Medication is removed from patient's med list. Dr Marin Olp notified.

## 2017-11-27 MED FILL — VIT D2 1.25 MG (50,000 UNIT: 1.25 MG | 84 days supply | Qty: 12 | Fill #2 | Status: TO

## 2018-01-22 ENCOUNTER — Encounter: Payer: Self-pay | Admitting: Hematology & Oncology

## 2018-01-22 ENCOUNTER — Other Ambulatory Visit: Payer: Self-pay

## 2018-01-22 ENCOUNTER — Inpatient Hospital Stay: Payer: Medicare Other

## 2018-01-22 ENCOUNTER — Inpatient Hospital Stay: Payer: Medicare Other | Attending: Hematology & Oncology | Admitting: Hematology & Oncology

## 2018-01-22 VITALS — BP 142/39 | HR 75 | Temp 97.7°F | Resp 18 | Wt 176.2 lb

## 2018-01-22 DIAGNOSIS — C50011 Malignant neoplasm of nipple and areola, right female breast: Secondary | ICD-10-CM

## 2018-01-22 DIAGNOSIS — Z853 Personal history of malignant neoplasm of breast: Secondary | ICD-10-CM

## 2018-01-22 DIAGNOSIS — M7989 Other specified soft tissue disorders: Secondary | ICD-10-CM

## 2018-01-22 DIAGNOSIS — C9190 Lymphoid leukemia, unspecified not having achieved remission: Secondary | ICD-10-CM

## 2018-01-22 DIAGNOSIS — K7460 Unspecified cirrhosis of liver: Secondary | ICD-10-CM

## 2018-01-22 DIAGNOSIS — Z79899 Other long term (current) drug therapy: Secondary | ICD-10-CM

## 2018-01-22 DIAGNOSIS — C9110 Chronic lymphocytic leukemia of B-cell type not having achieved remission: Secondary | ICD-10-CM

## 2018-01-22 DIAGNOSIS — K7581 Nonalcoholic steatohepatitis (NASH): Secondary | ICD-10-CM | POA: Diagnosis not present

## 2018-01-22 DIAGNOSIS — Z7982 Long term (current) use of aspirin: Secondary | ICD-10-CM

## 2018-01-22 DIAGNOSIS — I890 Lymphedema, not elsewhere classified: Secondary | ICD-10-CM

## 2018-01-22 LAB — CMP (CANCER CENTER ONLY)
ALBUMIN: 2.4 g/dL — AB (ref 3.5–5.0)
ALK PHOS: 223 U/L — AB (ref 40–150)
ALT: 39 U/L (ref 0–55)
ANION GAP: 5 (ref 3–11)
AST: 101 U/L — AB (ref 5–34)
BILIRUBIN TOTAL: 3 mg/dL — AB (ref 0.2–1.2)
BUN: 14 mg/dL (ref 7–26)
CALCIUM: 8.8 mg/dL (ref 8.4–10.4)
CO2: 25 mmol/L (ref 22–29)
Chloride: 103 mmol/L (ref 98–109)
Creatinine: 0.86 mg/dL (ref 0.60–1.10)
GFR, EST NON AFRICAN AMERICAN: 59 mL/min — AB (ref >60)
GFR, Est AFR Am: >60 mL/min (ref >60)
GLUCOSE: 193 mg/dL — AB (ref 70–140)
POTASSIUM: 4.6 mmol/L (ref 3.5–5.1)
Sodium: 133 mmol/L — ABNORMAL LOW (ref 136–145)
TOTAL PROTEIN: 5.8 g/dL — AB (ref 6.4–8.3)

## 2018-01-22 LAB — CBC WITH DIFFERENTIAL (CANCER CENTER ONLY)
BASOS ABS: 0 10*3/uL (ref 0.0–0.1)
Band Neutrophils: 0 %
Basophils Relative: 0 %
Blasts: 0 %
EOS ABS: 0 10*3/uL (ref 0.0–0.5)
EOS PCT: 0 %
HCT: 34.2 % — ABNORMAL LOW (ref 34.8–46.6)
HEMOGLOBIN: 12.4 g/dL (ref 11.6–15.9)
LYMPHS ABS: 8.5 10*3/uL — AB (ref 0.9–3.3)
Lymphocytes Relative: 66 %
MCH: 34.9 pg — AB (ref 26.0–34.0)
MCHC: 36.3 g/dL — ABNORMAL HIGH (ref 32.0–36.0)
MCV: 96.3 fL (ref 81.0–101.0)
METAMYELOCYTES PCT: 0 %
MYELOCYTES: 0 %
Monocytes Absolute: 1.2 10*3/uL — ABNORMAL HIGH (ref 0.1–0.9)
Monocytes Relative: 9 %
Neutro Abs: 3.3 10*3/uL (ref 1.5–6.5)
Neutrophils Relative %: 25 %
Other: 0 %
Platelet Count: 88 10*3/uL — ABNORMAL LOW (ref 145–400)
Promyelocytes Absolute: 0 %
RBC: 3.55 MIL/uL — AB (ref 3.70–5.32)
RDW: 16.4 % — ABNORMAL HIGH (ref 11.1–15.7)
WBC: 13 10*3/uL — AB (ref 3.9–10.0)
nRBC: 0 /100 WBC

## 2018-01-22 LAB — LACTATE DEHYDROGENASE: LDH: 320 U/L — ABNORMAL HIGH (ref 125–245)

## 2018-01-22 NOTE — Progress Notes (Signed)
Hematology and Oncology Follow Up Visit  Karen Richard [id number] [birth year] 84 y.o. 01/22/2018   Principle Diagnosis:  1. Stage A CLL. 2. 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 3. History of stage I (T1 N0 M0) ductal carcinoma of the left breast,     1999. 4. Cirrhosis due to steatohepatitis  Current Therapy:   Observation   Interim History:  Karen Richard is here today with Karen Richard husband for follow-up.  He still has some problems with leg swelling.  She apparently was hospitalized back in November.  She has gained about 25 pounds.  She was put on aggressive diuretics.  She is still has swelling in Karen Richard legs.  I really think that this is from Karen Richard having cirrhosis.  She has a low albumin of only 2.7.  As far as Karen Richard breast cancer is concerned, this really has not been much of an issue.  She also has CLL.  This is been early stage.  She has had no rashes.  She has had no bleeding.  There has been no change in bowel or bladder habits.  She has had no fever.  She has had no headache.  ECOG Performance Status: 1 - Symptomatic but completely ambulatory  Medications:  Allergies as of 01/22/2018      Reactions   Codeine Nausea Only   Darvocet [propoxyphene N-acetaminophen] Nausea Only      Medication List        Accurate as of 01/22/18 10:02 AM. Always use your most recent med list.          amoxicillin 500 MG tablet Commonly known as:  AMOXIL Take 500 mg by mouth. Take 4 tablets one hour before dental appointments   aspirin EC 81 MG EC tablet Generic drug:  aspirin Take 81 mg by mouth every evening.   furosemide 20 MG tablet Commonly known as:  LASIX Take 2 tablets (40 mg total) 2 (two) times daily by mouth.   LORazepam 0.5 MG tablet Commonly known as:  ATIVAN TAKE 1 TABLET BY MOUTH EVERY DAY AS NEEDED FOR ANXIETY   spironolactone 25 MG tablet Commonly known as:  ALDACTONE Take 25 mg by mouth daily.   TYLENOL ARTHRITIS EXT RELIEF PO Take 1  tablet as needed by mouth.   Vitamin D (Ergocalciferol) 50000 units Caps capsule Commonly known as:  DRISDOL TAKE 1 CAPSULE (50,000 UNITS TOTAL) BY MOUTH ONCE A WEEK.       Allergies:  Allergies  Allergen Reactions  . Codeine Nausea Only  . Darvocet [Propoxyphene N-Acetaminophen] Nausea Only    Past Medical History, Surgical history, Social history, and Family History were reviewed and updated.  Review of Systems: Review of Systems  Constitutional: Negative.   HENT: Negative.   Eyes: Negative.   Respiratory: Negative.   Cardiovascular: Positive for leg swelling.  Gastrointestinal: Negative.   Genitourinary: Negative.   Musculoskeletal: Positive for back pain.  Skin: Negative.   Neurological: Negative.   Endo/Heme/Allergies: Negative.   Psychiatric/Behavioral: Negative.      Physical Exam:  weight is 176 lb 4 oz (79.9 kg). Karen Richard oral temperature is 97.7 F (36.5 C). Karen Richard blood pressure is 142/39 (abnormal) and Karen Richard pulse is 75. Karen Richard respiration is 18 and oxygen saturation is 98%.   Wt Readings from Last 3 Encounters:  01/22/18 176 lb 4 oz (79.9 kg)  10/29/17 174 lb 6.4 oz (79.1 kg)  09/08/17 174 lb 14.4 oz (79.3 kg)    Physical Exam  Constitutional: She is oriented to person, place, and time.  HENT:  Head: Normocephalic and atraumatic.  Mouth/Throat: Oropharynx is clear and moist.  Eyes: Pupils are equal, round, and reactive to light. EOM are normal.  Neck: Normal range of motion.  Cardiovascular: Normal rate, regular rhythm and normal heart sounds.  Pulmonary/Chest: Effort normal and breath sounds normal.  Abdominal: Soft. Bowel sounds are normal.  Musculoskeletal: Normal range of motion. She exhibits no edema, tenderness or deformity.  Lymphadenopathy:    She has no cervical adenopathy.  Neurological: She is alert and oriented to person, place, and time.  Skin: Skin is warm and dry. No rash noted. No erythema.  Psychiatric: She has a normal mood and affect. Karen Richard  behavior is normal. Judgment and thought content normal.  Vitals reviewed.    Lab Results  Component Value Date   WBC 13.0 (H) 01/22/2018   HGB 12.7 10/29/2017   HCT 34.2 (L) 01/22/2018   MCV 96.3 01/22/2018   PLT 88 (L) 01/22/2018   Lab Results  Component Value Date   FERRITIN 358 (H) 02/07/2016   IRON 176 (H) 02/07/2016   TIBC 246 02/07/2016   UIBC 70 (L) 02/07/2016   IRONPCTSAT 72 (H) 02/07/2016   Lab Results  Component Value Date   RBC 3.55 (L) 01/22/2018   No results found for: [facility] Lab Results  Component Value Date   IGGSERUM 1,560 08/10/2014   IGA 222 08/10/2014   IGMSERUM 59 08/10/2014   Lab Results  Component Value Date   TOTALPROTELP 6.0 08/10/2014   ALBUMINELP 53.0 (L) 08/10/2014   A1GS 3.4 08/10/2014   A2GS 9.1 08/10/2014   BETS 6.3 08/10/2014   BETA2SER 4.1 08/10/2014   GAMS 24.1 (H) 08/10/2014   MSPIKE NOT DET 08/10/2014   SPEI * 08/10/2014     Chemistry      Component Value Date/Time   NA 140 10/29/2017 1105   NA 138 02/09/2017 0851   K 4.0 10/29/2017 1105   K 4.1 02/09/2017 0851   CL 103 10/29/2017 1105   CL 109 (H) 02/17/2013 1037   CO2 28 10/29/2017 1105   CO2 26 02/09/2017 0851   BUN 15 10/29/2017 1105   BUN 11.4 02/09/2017 0851   CREATININE 0.9 10/29/2017 1105   CREATININE 0.7 02/09/2017 0851      Component Value Date/Time   CALCIUM 9.5 10/29/2017 1105   CALCIUM 9.1 02/09/2017 0851   ALKPHOS 229 (H) 10/29/2017 1105   ALKPHOS 210 (H) 02/09/2017 0851   AST 73 (H) 10/29/2017 1105   AST 83 (H) 02/09/2017 0851   ALT 33 10/29/2017 1105   ALT 35 02/09/2017 0851   BILITOT 2.50 (H) 10/29/2017 1105   BILITOT 2.36 (H) 02/09/2017 0851      Impression and Plan: Karen Richard is a very pleasant 84 yo caucasian female stage A CLL. Karen Richard counts today remain stable. No anemia. No bruising or episodes of bleeding.   LFT's are still elevated with Karen Richard history of NASH.   She has the leg swelling.  I do not think  anything will really help with this.  Again I suspect this is from Karen Richard cirrhosis.  She also has history of bilateral breast cancer. Exam today was negative.   She is doing well and Karen Richard only complaint at this time is the leg swelling and it effect on Karen Richard mobility and ability to travel.   We will plan to see Karen Richard back again in another 6 months for repeat labs  and follow-up.   She will contact our office with any questions or concerns. We can certainly see Karen Richard sooner if need be.   Volanda Napoleon, MD 3/26/201910:02 AM

## 2018-05-06 ENCOUNTER — Other Ambulatory Visit: Payer: Self-pay | Admitting: Hematology & Oncology

## 2018-05-06 DIAGNOSIS — R945 Abnormal results of liver function studies: Secondary | ICD-10-CM

## 2018-05-06 DIAGNOSIS — M533 Sacrococcygeal disorders, not elsewhere classified: Secondary | ICD-10-CM

## 2018-05-06 DIAGNOSIS — E559 Vitamin D deficiency, unspecified: Secondary | ICD-10-CM

## 2018-05-06 DIAGNOSIS — R7989 Other specified abnormal findings of blood chemistry: Secondary | ICD-10-CM

## 2018-05-06 DIAGNOSIS — G8929 Other chronic pain: Secondary | ICD-10-CM

## 2018-07-23 ENCOUNTER — Other Ambulatory Visit: Payer: Medicare Other

## 2018-07-23 ENCOUNTER — Ambulatory Visit: Payer: Medicare Other | Admitting: Hematology & Oncology

## 2018-07-31 ENCOUNTER — Other Ambulatory Visit: Payer: Self-pay | Admitting: Hematology & Oncology

## 2018-07-31 DIAGNOSIS — R7989 Other specified abnormal findings of blood chemistry: Secondary | ICD-10-CM

## 2018-07-31 DIAGNOSIS — M533 Sacrococcygeal disorders, not elsewhere classified: Secondary | ICD-10-CM

## 2018-07-31 DIAGNOSIS — R945 Abnormal results of liver function studies: Secondary | ICD-10-CM

## 2018-07-31 DIAGNOSIS — G8929 Other chronic pain: Secondary | ICD-10-CM

## 2018-07-31 DIAGNOSIS — E559 Vitamin D deficiency, unspecified: Secondary | ICD-10-CM

## 2018-08-07 ENCOUNTER — Inpatient Hospital Stay (HOSPITAL_BASED_OUTPATIENT_CLINIC_OR_DEPARTMENT_OTHER): Payer: Medicare Other | Admitting: Hematology & Oncology

## 2018-08-07 ENCOUNTER — Encounter: Payer: Self-pay | Admitting: Hematology & Oncology

## 2018-08-07 ENCOUNTER — Inpatient Hospital Stay: Payer: Medicare Other | Attending: Hematology & Oncology

## 2018-08-07 ENCOUNTER — Other Ambulatory Visit: Payer: Self-pay

## 2018-08-07 VITALS — BP 144/37 | HR 86 | Temp 98.1°F | Resp 20 | Wt 176.0 lb

## 2018-08-07 DIAGNOSIS — Z79899 Other long term (current) drug therapy: Secondary | ICD-10-CM | POA: Diagnosis not present

## 2018-08-07 DIAGNOSIS — K7460 Unspecified cirrhosis of liver: Secondary | ICD-10-CM

## 2018-08-07 DIAGNOSIS — C9110 Chronic lymphocytic leukemia of B-cell type not having achieved remission: Secondary | ICD-10-CM

## 2018-08-07 DIAGNOSIS — Z853 Personal history of malignant neoplasm of breast: Secondary | ICD-10-CM | POA: Diagnosis not present

## 2018-08-07 DIAGNOSIS — K7581 Nonalcoholic steatohepatitis (NASH): Secondary | ICD-10-CM | POA: Diagnosis not present

## 2018-08-07 DIAGNOSIS — M7989 Other specified soft tissue disorders: Secondary | ICD-10-CM | POA: Insufficient documentation

## 2018-08-07 DIAGNOSIS — Z7982 Long term (current) use of aspirin: Secondary | ICD-10-CM | POA: Diagnosis not present

## 2018-08-07 DIAGNOSIS — C50011 Malignant neoplasm of nipple and areola, right female breast: Secondary | ICD-10-CM

## 2018-08-07 LAB — CMP (CANCER CENTER ONLY)
ALBUMIN: 3.1 g/dL — AB (ref 3.5–5.0)
ALK PHOS: 220 U/L — AB (ref 38–126)
ALT: 41 U/L (ref 0–44)
ANION GAP: 8 (ref 5–15)
AST: 112 U/L — ABNORMAL HIGH (ref 15–41)
BUN: 15 mg/dL (ref 8–23)
CALCIUM: 9.3 mg/dL (ref 8.9–10.3)
CHLORIDE: 98 mmol/L (ref 98–111)
CO2: 24 mmol/L (ref 22–32)
Creatinine: 0.77 mg/dL (ref 0.44–1.00)
GFR, Estimated: >60 mL/min (ref >60)
GLUCOSE: 106 mg/dL — AB (ref 70–99)
POTASSIUM: 4.9 mmol/L (ref 3.5–5.1)
SODIUM: 130 mmol/L — AB (ref 135–145)
Total Bilirubin: 5.2 mg/dL (ref 0.3–1.2)
Total Protein: 6.5 g/dL (ref 6.5–8.1)

## 2018-08-07 LAB — LACTATE DEHYDROGENASE: LDH: 375 U/L — AB (ref 98–192)

## 2018-08-07 NOTE — Progress Notes (Signed)
Hematology and Oncology Follow Up Visit  Karen Richard [id number] 23-Apr-1927 84 y.o. 08/07/2018   Principle Diagnosis:  1. Stage A CLL. 2. 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 3. History of stage I (T1 N0 M0) ductal carcinoma of the left breast,     1999. 4. Cirrhosis due to steatohepatitis  Current Therapy:   Observation   Interim History:  Karen Richard is here today for follow-up.  Unfortunately, her husband has been in the hospital for about 2 weeks.  He had bypass surgery for his heart.  He had a triple bypass.  He will be going home today.  She still has a lot of swelling in her legs.  She does have compression boots that she does wear when she gets home.  She is had no problem with infections.  She is had no fever.  She is had no obvious change in bowel or bladder habits.  She has had no rashes.    ECOG Performance Status: 1 - Symptomatic but completely ambulatory  Medications:  Allergies as of 08/07/2018      Reactions   Codeine Nausea Only   Darvocet [propoxyphene N-acetaminophen] Nausea Only      Medication List        Accurate as of 08/07/18 11:55 AM. Always use your most recent med list.          amoxicillin 500 MG tablet Commonly known as:  AMOXIL Take 500 mg by mouth. Take 4 tablets one hour before dental appointments   aspirin EC 81 MG EC tablet Generic drug:  aspirin Take 81 mg by mouth every morning.   furosemide 20 MG tablet Commonly known as:  LASIX Take 2 tablets (40 mg total) 2 (two) times daily by mouth.   LORazepam 0.5 MG tablet Commonly known as:  ATIVAN TAKE 1 TABLET BY MOUTH EVERY DAY AS NEEDED FOR ANXIETY   spironolactone 25 MG tablet Commonly known as:  ALDACTONE Take 12.5 mg by mouth daily.   TYLENOL ARTHRITIS EXT RELIEF PO Take 1 tablet as needed by mouth.   Vitamin D (Ergocalciferol) 50000 units Caps capsule Commonly known as:  DRISDOL TAKE 1 CAPSULE ONCE A WEEK       Allergies:  Allergies    Allergen Reactions  . Codeine Nausea Only  . Darvocet [Propoxyphene N-Acetaminophen] Nausea Only    Past Medical History, Surgical history, Social history, and Family History were reviewed and updated.  Review of Systems: Review of Systems  Constitutional: Negative.   HENT: Negative.   Eyes: Negative.   Respiratory: Negative.   Cardiovascular: Positive for leg swelling.  Gastrointestinal: Negative.   Genitourinary: Negative.   Musculoskeletal: Positive for back pain.  Skin: Negative.   Neurological: Negative.   Endo/Heme/Allergies: Negative.   Psychiatric/Behavioral: Negative.      Physical Exam:  weight is 176 lb (79.8 kg). Her oral temperature is 98.1 F (36.7 C). Her blood pressure is 144/37 (abnormal) and her pulse is 86. Her respiration is 20 and oxygen saturation is 98%.   Wt Readings from Last 3 Encounters:  08/07/18 176 lb (79.8 kg)  01/22/18 176 lb 4 oz (79.9 kg)  10/29/17 174 lb 6.4 oz (79.1 kg)    Physical Exam  Constitutional: She is oriented to person, place, and time.  HENT:  Head: Normocephalic and atraumatic.  Mouth/Throat: Oropharynx is clear and moist.  Eyes: Pupils are equal, round, and reactive to light. EOM are normal.  Neck: Normal range of motion.  Cardiovascular: Normal rate, regular rhythm and normal heart sounds.  Pulmonary/Chest: Effort normal and breath sounds normal.  Abdominal: Soft. Bowel sounds are normal.  Musculoskeletal: Normal range of motion. She exhibits no edema, tenderness or deformity.  Lymphadenopathy:    She has no cervical adenopathy.  Neurological: She is alert and oriented to person, place, and time.  Skin: Skin is warm and dry. No rash noted. No erythema.  Psychiatric: She has a normal mood and affect. Her behavior is normal. Judgment and thought content normal.  Vitals reviewed.    Lab Results  Component Value Date   WBC 19.1 (H) 08/07/2018   HGB 11.8 (L) 08/07/2018   HCT 34.5 (L) 08/07/2018   MCV 98.3  08/07/2018   PLT 117 (L) 08/07/2018   Lab Results  Component Value Date   FERRITIN 358 (H) 02/07/2016   IRON 176 (H) 02/07/2016   TIBC 246 02/07/2016   UIBC 70 (L) 02/07/2016   IRONPCTSAT 72 (H) 02/07/2016   Lab Results  Component Value Date   RBC 3.51 (L) 08/07/2018   No results found for: [facility] Lab Results  Component Value Date   IGGSERUM 1,560 08/10/2014   IGA 222 08/10/2014   IGMSERUM 59 08/10/2014   Lab Results  Component Value Date   TOTALPROTELP 6.0 08/10/2014   ALBUMINELP 53.0 (L) 08/10/2014   A1GS 3.4 08/10/2014   A2GS 9.1 08/10/2014   BETS 6.3 08/10/2014   BETA2SER 4.1 08/10/2014   GAMS 24.1 (H) 08/10/2014   MSPIKE NOT DET 08/10/2014   SPEI * 08/10/2014     Chemistry      Component Value Date/Time   NA 133 (L) 01/22/2018 0847   NA 140 10/29/2017 1105   NA 138 02/09/2017 0851   K 4.6 01/22/2018 0847   K 4.0 10/29/2017 1105   K 4.1 02/09/2017 0851   CL 103 01/22/2018 0847   CL 103 10/29/2017 1105   CL 109 (H) 02/17/2013 1037   CO2 25 01/22/2018 0847   CO2 28 10/29/2017 1105   CO2 26 02/09/2017 0851   BUN 14 01/22/2018 0847   BUN 15 10/29/2017 1105   BUN 11.4 02/09/2017 0851   CREATININE 0.86 01/22/2018 0847   CREATININE 0.9 10/29/2017 1105   CREATININE 0.7 02/09/2017 0851      Component Value Date/Time   CALCIUM 8.8 01/22/2018 0847   CALCIUM 9.5 10/29/2017 1105   CALCIUM 9.1 02/09/2017 0851   ALKPHOS 223 (H) 01/22/2018 0847   ALKPHOS 229 (H) 10/29/2017 1105   ALKPHOS 210 (H) 02/09/2017 0851   AST 101 (H) 01/22/2018 0847   AST 83 (H) 02/09/2017 0851   ALT 39 01/22/2018 0847   ALT 33 10/29/2017 1105   ALT 35 02/09/2017 0851   BILITOT 3.0 (H) 01/22/2018 0847   BILITOT 2.36 (H) 02/09/2017 0851      Impression and Plan: Karen Richard is a very pleasant 84 yo caucasian female stage A CLL. Her counts today remain stable. No anemia. No bruising or episodes of bleeding.   Her white cell count is up a little bit  more.  However, her platelet count is better.  I am sure that the white cell count is up from the CLL.  I still think we can follow her up in 6 months.  I think this would be reasonable.  I still do not see a need to embark upon therapy for her.  I missed talking to her husband today as he is a huge [employer]  football fan.     Volanda Napoleon, MD 10/9/201911:55 AM

## 2018-08-08 LAB — CBC WITH DIFFERENTIAL (CANCER CENTER ONLY)
BASOS ABS: 0.2 10*3/uL — AB (ref 0.0–0.1)
Basophils Relative: 1 %
EOS ABS: 0.3 10*3/uL (ref 0.0–0.5)
Eosinophils Relative: 2 %
HEMATOCRIT: 34.5 % — AB (ref 36.0–46.0)
Hemoglobin: 11.8 g/dL — ABNORMAL LOW (ref 12.0–15.0)
LYMPHS ABS: 11 10*3/uL — AB (ref 0.7–4.0)
LYMPHS PCT: 57 %
MCH: 33.6 pg (ref 26.0–34.0)
MCHC: 34.2 g/dL (ref 30.0–36.0)
MCV: 98.3 fL (ref 80.0–100.0)
Monocytes Absolute: 3.4 10*3/uL — ABNORMAL HIGH (ref 0.1–1.0)
Monocytes Relative: 18 %
NEUTROS ABS: 4.2 10*3/uL (ref 1.7–7.7)
NEUTROS PCT: 22 %
NRBC: 0 % (ref 0.0–0.2)
Platelet Count: 117 10*3/uL — ABNORMAL LOW (ref 150–400)
RBC: 3.51 MIL/uL — ABNORMAL LOW (ref 3.87–5.11)
RDW: 16.9 % — ABNORMAL HIGH (ref 11.5–15.5)
WBC Count: 19.1 10*3/uL — ABNORMAL HIGH (ref 4.0–10.5)

## 2018-08-08 LAB — IGG, IGA, IGM
IGA: 294 mg/dL (ref 64–422)
IGG (IMMUNOGLOBIN G), SERUM: 1681 mg/dL — AB (ref 700–1600)
IgM (Immunoglobulin M), Srm: 70 mg/dL (ref 26–217)

## 2018-08-08 LAB — SAVE SMEAR(SSMR), FOR PROVIDER SLIDE REVIEW

## 2018-10-14 ENCOUNTER — Other Ambulatory Visit: Payer: Self-pay

## 2018-10-14 DIAGNOSIS — R600 Localized edema: Secondary | ICD-10-CM

## 2018-10-18 ENCOUNTER — Encounter: Payer: Self-pay | Admitting: Vascular Surgery

## 2018-10-18 ENCOUNTER — Ambulatory Visit (HOSPITAL_COMMUNITY)
Admission: RE | Admit: 2018-10-18 | Discharge: 2018-10-18 | Disposition: A | Discharge status: Home / Self Care | Payer: Medicare Other | Source: Ambulatory Visit | Attending: Vascular Surgery | Admitting: Vascular Surgery

## 2018-10-18 ENCOUNTER — Ambulatory Visit: Payer: Medicare Other | Admitting: Vascular Surgery

## 2018-10-18 ENCOUNTER — Other Ambulatory Visit: Payer: Self-pay

## 2018-10-18 VITALS — BP 144/62 | HR 81 | Temp 97.6°F | Resp 20 | Ht 62.0 in | Wt 175.0 lb

## 2018-10-18 DIAGNOSIS — R600 Localized edema: Secondary | ICD-10-CM

## 2018-10-18 NOTE — Progress Notes (Signed)
Patient ID: Karen Richard, female   DOB: [birth year], 84 y.o.   MRN: [id number]  Reason for Consult: Leg Swelling (bialt)   Referred by Caren Macadam, MD  Subjective:     HPI:  Karen Richard is a 84 y.o. female presents for reevaluation bilateral lower extremity swelling right greater than left.  I had previously seen her recommended compression stockings.  She has persistent swelling.  Her chief complaint today is actually having back pain radiating down her legs.  She has a recent back injury from lifting her husband states that she is seeing physical therapy for this.  She does wear compression although she is now wearing them today states this helps minimally.  She is limited in her ability to ambulate given back pain  Past Medical History:  Diagnosis Date  . Anxiety   . Arthritis   . Cancer ([place])   . Cataract   . Hyperlipidemia   . Neuromuscular disorder ([place])   . Swelling in chest    rigth breast where mastectomy was performed    Family History  Problem Relation Age of Onset  . Diabetes Mother   . Heart attack Mother   . Diabetes Father   . Heart attack Father    Past Surgical History:  Procedure Laterality Date  . ABDOMINAL HYSTERECTOMY    . APPENDECTOMY    . BREAST SURGERY    . CHOLECYSTECTOMY    . EYE SURGERY    . JOINT REPLACEMENT      Short Social History:  Social History   Tobacco Use  . Smoking status: Never Smoker  . Smokeless tobacco: Never Used  . Tobacco comment: never used tobacco  Substance Use Topics  . Alcohol use: No    Alcohol/week: 0.0 standard drinks    Allergies  Allergen Reactions  . Codeine Nausea Only  . Darvocet [Propoxyphene N-Acetaminophen] Nausea Only    Current Outpatient Medications  Medication Sig Dispense Refill  . Acetaminophen (TYLENOL ARTHRITIS EXT RELIEF PO) Take 1 tablet as needed by mouth.     Marland Kitchen amoxicillin (AMOXIL) 500 MG tablet Take 500 mg by mouth. Take 4 tablets one hour before dental appointments     . aspirin (ASPIRIN EC) 81 MG EC tablet Take 81 mg by mouth every morning.     . furosemide (LASIX) 20 MG tablet Take 2 tablets (40 mg total) 2 (two) times daily by mouth. (Patient taking differently: Take 10 mg by mouth daily. ) 60 tablet 1  . LORazepam (ATIVAN) 0.5 MG tablet TAKE 1 TABLET BY MOUTH EVERY DAY AS NEEDED FOR ANXIETY 30 tablet 0  . spironolactone (ALDACTONE) 25 MG tablet Take 12.5 mg by mouth every other day.     . Vitamin D, Ergocalciferol, (DRISDOL) 50000 units CAPS capsule TAKE 1 CAPSULE ONCE A WEEK 12 capsule 0   No current facility-administered medications for this visit.     Review of Systems  Constitutional: Positive for fatigue.  Respiratory: Respiratory negative.  Cardiovascular: Positive for leg swelling.  GI: Gastrointestinal negative.  Musculoskeletal: Positive for back pain, gait problem, leg pain and joint pain.  Hematologic: Hematologic/lymphatic negative.  Psychiatric: Psychiatric negative.        Objective:  Objective   Vitals:   10/18/18 1430  BP: (!) 144/62  Pulse: 81  Resp: 20  Temp: 97.6 F (36.4 C)  SpO2: 97%  Weight: 175 lb (79.4 kg)  Height: 5' 2"  (1.575 m)   Body mass index is 32.01 kg/m.  Physical  Exam HENT:     Head: Normocephalic.  Eyes:     Extraocular Movements: Extraocular movements intact.     Pupils: Pupils are equal, round, and reactive to light.  Neck:     Musculoskeletal: Normal range of motion and neck supple.  Cardiovascular:     Rate and Rhythm: Regular rhythm.     Pulses:          Carotid pulses are 2+ on the right side and 2+ on the left side.      Radial pulses are 2+ on the right side and 2+ on the left side.  Pulmonary:     Effort: Pulmonary effort is normal.  Musculoskeletal:     Right lower leg: Edema present.     Left lower leg: Edema present.  Skin:    General: Skin is warm and dry.  Neurological:     Mental Status: She is alert.  Psychiatric:        Mood and Affect: Mood normal.         Behavior: Behavior normal.        Thought Content: Thought content normal.        Judgment: Judgment normal.     Data: I have independently interpreted her right lower extremity reflux study which demonstrates no reflux but a saphenous vein that measures up to 0.87 cm at the junction     Assessment/Plan:     84 year old female here for evaluation bilateral lower extremity nonpitting edema which is actually quite impressive.  Some component of this is actually lipedema given what appears to be sparing of her feet bilaterally.  There is no indication for venous intervention.  I have discussed with her conservative measures including compression stockings when she is ambulatory and elevation when she is nonambulatory.  I have also discussed water aerobics given the the natural graduated compression of the swimming pool may help her and she does have an indoor swimming pool at her [facility] community.  She can follow-up with me on an as-needed basis.     Waynetta Sandy MD [facility]

## 2018-10-29 ENCOUNTER — Other Ambulatory Visit (HOSPITAL_BASED_OUTPATIENT_CLINIC_OR_DEPARTMENT_OTHER): Payer: Self-pay | Admitting: Physical Medicine and Rehabilitation

## 2018-10-29 DIAGNOSIS — M545 Low back pain, unspecified: Secondary | ICD-10-CM

## 2018-10-29 DIAGNOSIS — G8929 Other chronic pain: Secondary | ICD-10-CM

## 2018-11-02 ENCOUNTER — Ambulatory Visit (HOSPITAL_BASED_OUTPATIENT_CLINIC_OR_DEPARTMENT_OTHER)
Admission: RE | Admit: 2018-11-02 | Discharge: 2018-11-02 | Disposition: A | Discharge status: Home / Self Care | Payer: Medicare Other | Source: Ambulatory Visit | Attending: Physical Medicine and Rehabilitation | Admitting: Physical Medicine and Rehabilitation

## 2018-11-02 DIAGNOSIS — G8929 Other chronic pain: Secondary | ICD-10-CM | POA: Diagnosis present

## 2018-11-02 DIAGNOSIS — M545 Low back pain, unspecified: Secondary | ICD-10-CM

## 2018-11-04 ENCOUNTER — Other Ambulatory Visit: Payer: Self-pay

## 2018-11-04 ENCOUNTER — Inpatient Hospital Stay (HOSPITAL_COMMUNITY)
Admission: EM | Admit: 2018-11-04 | Discharge: 2018-11-12 | DRG: 292 | Disposition: A | Discharge status: Home Health | Payer: Medicare Other | Attending: Family Medicine | Admitting: Family Medicine

## 2018-11-04 ENCOUNTER — Other Ambulatory Visit: Payer: Self-pay | Admitting: Hematology & Oncology

## 2018-11-04 ENCOUNTER — Encounter (HOSPITAL_COMMUNITY): Payer: Self-pay | Admitting: *Deleted

## 2018-11-04 ENCOUNTER — Emergency Department (HOSPITAL_COMMUNITY): Payer: Medicare Other

## 2018-11-04 ENCOUNTER — Emergency Department (HOSPITAL_BASED_OUTPATIENT_CLINIC_OR_DEPARTMENT_OTHER): Payer: Medicare Other

## 2018-11-04 DIAGNOSIS — R140 Abdominal distension (gaseous): Secondary | ICD-10-CM

## 2018-11-04 DIAGNOSIS — Z7982 Long term (current) use of aspirin: Secondary | ICD-10-CM

## 2018-11-04 DIAGNOSIS — R600 Localized edema: Secondary | ICD-10-CM

## 2018-11-04 DIAGNOSIS — M549 Dorsalgia, unspecified: Secondary | ICD-10-CM

## 2018-11-04 DIAGNOSIS — I872 Venous insufficiency (chronic) (peripheral): Secondary | ICD-10-CM | POA: Diagnosis present

## 2018-11-04 DIAGNOSIS — L570 Actinic keratosis: Secondary | ICD-10-CM | POA: Diagnosis present

## 2018-11-04 DIAGNOSIS — K7460 Unspecified cirrhosis of liver: Secondary | ICD-10-CM | POA: Diagnosis present

## 2018-11-04 DIAGNOSIS — R0602 Shortness of breath: Secondary | ICD-10-CM

## 2018-11-04 DIAGNOSIS — D696 Thrombocytopenia, unspecified: Secondary | ICD-10-CM | POA: Diagnosis present

## 2018-11-04 DIAGNOSIS — E669 Obesity, unspecified: Secondary | ICD-10-CM | POA: Diagnosis present

## 2018-11-04 DIAGNOSIS — I5033 Acute on chronic diastolic (congestive) heart failure: Secondary | ICD-10-CM | POA: Diagnosis present

## 2018-11-04 DIAGNOSIS — L304 Erythema intertrigo: Secondary | ICD-10-CM

## 2018-11-04 DIAGNOSIS — G8929 Other chronic pain: Secondary | ICD-10-CM

## 2018-11-04 DIAGNOSIS — D638 Anemia in other chronic diseases classified elsewhere: Secondary | ICD-10-CM | POA: Diagnosis present

## 2018-11-04 DIAGNOSIS — R945 Abnormal results of liver function studies: Secondary | ICD-10-CM

## 2018-11-04 DIAGNOSIS — E785 Hyperlipidemia, unspecified: Secondary | ICD-10-CM | POA: Diagnosis present

## 2018-11-04 DIAGNOSIS — E876 Hypokalemia: Secondary | ICD-10-CM | POA: Diagnosis present

## 2018-11-04 DIAGNOSIS — I272 Pulmonary hypertension, unspecified: Secondary | ICD-10-CM | POA: Diagnosis present

## 2018-11-04 DIAGNOSIS — Z8249 Family history of ischemic heart disease and other diseases of the circulatory system: Secondary | ICD-10-CM

## 2018-11-04 DIAGNOSIS — B372 Candidiasis of skin and nail: Secondary | ICD-10-CM | POA: Diagnosis present

## 2018-11-04 DIAGNOSIS — R188 Other ascites: Secondary | ICD-10-CM

## 2018-11-04 DIAGNOSIS — Z9049 Acquired absence of other specified parts of digestive tract: Secondary | ICD-10-CM

## 2018-11-04 DIAGNOSIS — K743 Primary biliary cirrhosis: Secondary | ICD-10-CM

## 2018-11-04 DIAGNOSIS — Z90710 Acquired absence of both cervix and uterus: Secondary | ICD-10-CM

## 2018-11-04 DIAGNOSIS — Z66 Do not resuscitate: Secondary | ICD-10-CM | POA: Diagnosis present

## 2018-11-04 DIAGNOSIS — Z833 Family history of diabetes mellitus: Secondary | ICD-10-CM

## 2018-11-04 DIAGNOSIS — R7989 Other specified abnormal findings of blood chemistry: Secondary | ICD-10-CM

## 2018-11-04 DIAGNOSIS — R161 Splenomegaly, not elsewhere classified: Secondary | ICD-10-CM | POA: Diagnosis present

## 2018-11-04 DIAGNOSIS — I110 Hypertensive heart disease with heart failure: Secondary | ICD-10-CM | POA: Diagnosis not present

## 2018-11-04 DIAGNOSIS — G2581 Restless legs syndrome: Secondary | ICD-10-CM | POA: Diagnosis present

## 2018-11-04 DIAGNOSIS — R410 Disorientation, unspecified: Secondary | ICD-10-CM | POA: Diagnosis not present

## 2018-11-04 DIAGNOSIS — E873 Alkalosis: Secondary | ICD-10-CM | POA: Diagnosis not present

## 2018-11-04 DIAGNOSIS — F419 Anxiety disorder, unspecified: Secondary | ICD-10-CM

## 2018-11-04 DIAGNOSIS — C9110 Chronic lymphocytic leukemia of B-cell type not having achieved remission: Secondary | ICD-10-CM

## 2018-11-04 DIAGNOSIS — K7581 Nonalcoholic steatohepatitis (NASH): Secondary | ICD-10-CM | POA: Diagnosis present

## 2018-11-04 DIAGNOSIS — D72829 Elevated white blood cell count, unspecified: Secondary | ICD-10-CM

## 2018-11-04 DIAGNOSIS — Z79899 Other long term (current) drug therapy: Secondary | ICD-10-CM

## 2018-11-04 DIAGNOSIS — Z853 Personal history of malignant neoplasm of breast: Secondary | ICD-10-CM

## 2018-11-04 DIAGNOSIS — Z96651 Presence of right artificial knee joint: Secondary | ICD-10-CM | POA: Diagnosis present

## 2018-11-04 DIAGNOSIS — Z6831 Body mass index (BMI) 31.0-31.9, adult: Secondary | ICD-10-CM

## 2018-11-04 DIAGNOSIS — I509 Heart failure, unspecified: Secondary | ICD-10-CM

## 2018-11-04 DIAGNOSIS — E8809 Other disorders of plasma-protein metabolism, not elsewhere classified: Secondary | ICD-10-CM

## 2018-11-04 DIAGNOSIS — Z9013 Acquired absence of bilateral breasts and nipples: Secondary | ICD-10-CM

## 2018-11-04 DIAGNOSIS — Z885 Allergy status to narcotic agent status: Secondary | ICD-10-CM

## 2018-11-04 DIAGNOSIS — M533 Sacrococcygeal disorders, not elsewhere classified: Secondary | ICD-10-CM

## 2018-11-04 DIAGNOSIS — E559 Vitamin D deficiency, unspecified: Secondary | ICD-10-CM

## 2018-11-04 LAB — CBC WITH DIFFERENTIAL/PLATELET
Abs Immature Granulocytes: 0 10*3/uL (ref 0.00–0.07)
BASOS PCT: 0 %
Basophils Absolute: 0 10*3/uL (ref 0.0–0.1)
Eosinophils Absolute: 0.7 10*3/uL — ABNORMAL HIGH (ref 0.0–0.5)
Eosinophils Relative: 4 %
HCT: 35.1 % — ABNORMAL LOW (ref 36.0–46.0)
HEMOGLOBIN: 11.7 g/dL — AB (ref 12.0–15.0)
Lymphocytes Relative: 15 %
Lymphs Abs: 2.8 10*3/uL (ref 0.7–4.0)
MCH: 33.1 pg (ref 26.0–34.0)
MCHC: 33.3 g/dL (ref 30.0–36.0)
MCV: 99.4 fL (ref 80.0–100.0)
Monocytes Absolute: 2.6 10*3/uL — ABNORMAL HIGH (ref 0.1–1.0)
Monocytes Relative: 14 %
Neutro Abs: 12.4 10*3/uL — ABNORMAL HIGH (ref 1.7–7.7)
Neutrophils Relative %: 67 %
Platelets: 115 10*3/uL — ABNORMAL LOW (ref 150–400)
RBC: 3.53 MIL/uL — ABNORMAL LOW (ref 3.87–5.11)
RDW: 17.6 % — ABNORMAL HIGH (ref 11.5–15.5)
WBC: 18.5 10*3/uL — ABNORMAL HIGH (ref 4.0–10.5)
nRBC: 0 % (ref 0.0–0.2)
nRBC: 0 /100 WBC

## 2018-11-04 LAB — COMPREHENSIVE METABOLIC PANEL
ALT: 36 U/L (ref 0–44)
AST: 102 U/L — ABNORMAL HIGH (ref 15–41)
Albumin: 2.1 g/dL — ABNORMAL LOW (ref 3.5–5.0)
Alkaline Phosphatase: 229 U/L — ABNORMAL HIGH (ref 38–126)
Anion gap: 6 (ref 5–15)
BUN: 12 mg/dL (ref 8–23)
CALCIUM: 8.5 mg/dL — AB (ref 8.9–10.3)
CO2: 26 mmol/L (ref 22–32)
Chloride: 103 mmol/L (ref 98–111)
Creatinine, Ser: 0.82 mg/dL (ref 0.44–1.00)
GFR calc Af Amer: >60 mL/min (ref >60)
GFR calc non Af Amer: >60 mL/min (ref >60)
Glucose, Bld: 134 mg/dL — ABNORMAL HIGH (ref 70–99)
Potassium: 4.1 mmol/L (ref 3.5–5.1)
Sodium: 135 mmol/L (ref 135–145)
Total Bilirubin: 4 mg/dL — ABNORMAL HIGH (ref 0.3–1.2)
Total Protein: 5.1 g/dL — ABNORMAL LOW (ref 6.5–8.1)

## 2018-11-04 LAB — URINALYSIS, ROUTINE W REFLEX MICROSCOPIC
Bilirubin Urine: NEGATIVE
Glucose, UA: NEGATIVE mg/dL
Hgb urine dipstick: NEGATIVE
Ketones, ur: NEGATIVE mg/dL
Nitrite: NEGATIVE
Protein, ur: NEGATIVE mg/dL
SPECIFIC GRAVITY, URINE: 1.005 (ref 1.005–1.030)
pH: 6 (ref 5.0–8.0)

## 2018-11-04 LAB — I-STAT TROPONIN, ED: Troponin i, poc: 0.02 ng/mL (ref 0.00–0.08)

## 2018-11-04 LAB — BRAIN NATRIURETIC PEPTIDE: B Natriuretic Peptide: 97.5 pg/mL (ref 0.0–100.0)

## 2018-11-04 MED ORDER — ACETAMINOPHEN 500 MG PO TABS: 1000.0000 mg | ORAL_TABLET | Freq: Three times a day (TID) | ORAL | Status: DC | PRN

## 2018-11-04 MED ORDER — FUROSEMIDE 10 MG/ML IJ SOLN
40.0000 mg | Freq: Two times a day (BID) | INTRAMUSCULAR | Status: DC
  Administered 2018-11-05 – 2018-11-06 (×3): 40 mg via INTRAVENOUS
  Filled 2018-11-04 (×3): qty 4

## 2018-11-04 MED ORDER — DM-GUAIFENESIN ER 30-600 MG PO TB12
1.0000 | ORAL_TABLET | Freq: Two times a day (BID) | ORAL | Status: DC
  Administered 2018-11-05 – 2018-11-12 (×15): 1 via ORAL
  Filled 2018-11-04 (×15): qty 1

## 2018-11-04 MED ORDER — ASPIRIN EC 81 MG PO TBEC
81.0000 mg | DELAYED_RELEASE_TABLET | Freq: Every morning | ORAL | Status: DC
  Administered 2018-11-05 – 2018-11-12 (×8): 81 mg via ORAL
  Filled 2018-11-04 (×8): qty 1

## 2018-11-04 MED ORDER — LORAZEPAM 0.5 MG PO TABS: 0.5000 mg | ORAL_TABLET | Freq: Every day | ORAL | Status: DC | PRN

## 2018-11-04 MED ORDER — ENOXAPARIN SODIUM 40 MG/0.4ML ~~LOC~~ SOLN: 40.0000 mg | SUBCUTANEOUS | Status: DC

## 2018-11-04 MED ORDER — FUROSEMIDE 10 MG/ML IJ SOLN
40.0000 mg | Freq: Once | INTRAMUSCULAR | Status: AC
  Administered 2018-11-04: 40 mg via INTRAVENOUS
  Filled 2018-11-04: qty 4

## 2018-11-04 MED ORDER — ENOXAPARIN SODIUM 40 MG/0.4ML ~~LOC~~ SOLN
40.0000 mg | SUBCUTANEOUS | Status: DC
  Administered 2018-11-05 – 2018-11-12 (×8): 40 mg via SUBCUTANEOUS
  Filled 2018-11-04 (×9): qty 0.4

## 2018-11-04 MED ORDER — CLOTRIMAZOLE 1 % EX CREA
TOPICAL_CREAM | Freq: Two times a day (BID) | CUTANEOUS | Status: DC
  Administered 2018-11-04 – 2018-11-06 (×4): via TOPICAL
  Administered 2018-11-06: 1 via TOPICAL
  Administered 2018-11-07 – 2018-11-12 (×11): via TOPICAL
  Filled 2018-11-04 (×3): qty 15

## 2018-11-04 NOTE — ED Notes (Signed)
Spouse called, wanted wife to know he made it home safely.  Message was delivered to pt.

## 2018-11-04 NOTE — ED Notes (Signed)
Attempted to call report; nurse states will call back in a couple of minutes

## 2018-11-04 NOTE — ED Provider Notes (Signed)
[facility] EMERGENCY DEPARTMENT Provider Note   CSN: [id number] Arrival date & time: 11/04/18  1432     History   Chief Complaint Chief Complaint  Patient presents with  . Shortness of Breath    HPI Karen Richard is a 85 y.o. female.  Patient with history of leukemia, breast cancer status post mastectomy, hysterectomy, cirrhosis, CHF on furosemide (grade 1 diastolic dysfunction noted on echocardiogram in 2018) -- presents the emergency department today at the advice of her doctor with complaint of lower extremity swelling, left arm swelling, and a red rash on her abdomen.  Husband reports increasing shortness of breath with exertion over the past several months.  Patient is noted worsening lower extremity swelling over this time.  She has been taking her Lasix without any recent medication changes.  She saw vascular surgery prior to Christmas who made some recommendations.  Over the past week or so she developed swelling her left arm.  She has also developed a red rash in the crease of her abdomen.  Rashes not painful.  She denies any fevers.  She has an occasional cough.  She is able to lie on her side to sleep.  No swelling of her abdomen.  No vomiting or diarrhea. States she had doppler studies of her lower extremities at her vascular appointment.      Past Medical History:  Diagnosis Date  . Anxiety   . Arthritis   . Cancer ([place])   . Cataract   . Hyperlipidemia   . Neuromuscular disorder ([place])   . Swelling in chest    rigth breast where mastectomy was performed     Patient Active Problem List   Diagnosis Date Noted  . Anasarca 09/05/2017  . Cirrhosis of liver without ascites ([place]) 02/09/2017  . LBBB (left bundle branch block) 03/31/2016  . Lymphedema 03/31/2016  . Peripheral edema 02/06/2013  . Back pain, thoracic 05/14/2012  . CLL (chronic lymphocytic leukemia) ([place]) 02/19/2012  . Urinary frequency 01/11/2011  . RUQ abdominal pain 01/11/2011    . ARTHRITIS, CERVICAL SPINE 06/22/2010  . DISTURBANCE OF SKIN SENSATION 06/22/2010  . HYPOGLYCEMIA, UNSPECIFIED 02/08/2010  . IRREGULAR HEART RATE 02/01/2010  . ABNORMAL INVOLUNTARY MOVEMENTS 02/01/2010  . CHEST DISCOMFORT 02/01/2010  . INSOMNIA 07/22/2009  . OTHER MALAISE AND FATIGUE 07/22/2009  . HEAT INTOLERANCE 07/22/2009  . VITAMIN D DEFICIENCY 02/11/2009  . Malignant neoplasm of female breast ([place]) 12/04/2008  . ANXIETY, SITUATIONAL 07/22/2008  . NOCTURIA 07/22/2008  . Elevated blood pressure reading without diagnosis of hypertension 07/22/2008  . SYMPTOM, COUGH 03/11/2007  . ONYCHOMYCOSIS 12/27/2006  . HYPERLIPIDEMIA 12/27/2006  . RESTLESS LEGS SYNDROME 12/27/2006  . SEBORRHEIC KERATOSIS, NOS 12/27/2006  . DJD, UNSPECIFIED 12/27/2006  . OSTEOARTHRITIS, MULTI SITES 12/27/2006    Past Surgical History:  Procedure Laterality Date  . ABDOMINAL HYSTERECTOMY    . APPENDECTOMY    . BREAST SURGERY    . CHOLECYSTECTOMY    . EYE SURGERY    . JOINT REPLACEMENT       OB History   No obstetric history on file.      Home Medications    Prior to Admission medications   Medication Sig Start Date End Date Taking? Authorizing Provider  Acetaminophen (TYLENOL ARTHRITIS EXT RELIEF PO) Take 1 tablet by mouth as needed (pain).    Yes [provider]  amoxicillin (AMOXIL) 500 MG tablet Take 2,000 mg by mouth See admin instructions. Take 2019m 1 hour before dental appointments  Yes [provider]  aspirin (ASPIRIN EC) 81 MG EC tablet Take 81 mg by mouth every morning.    Yes [provider]  furosemide (LASIX) 20 MG tablet Take 2 tablets (40 mg total) 2 (two) times daily by mouth. Patient taking differently: Take 10 mg by mouth every other day.  09/08/17  Yes Georgette Shell, MD  LORazepam (ATIVAN) 0.5 MG tablet TAKE 1 TABLET BY MOUTH EVERY DAY AS NEEDED FOR ANXIETY Patient taking differently: Take 0.5 mg by mouth daily as needed for anxiety.   01/28/14  Yes Ennever, Rudell Cobb, MD  Vitamin D, Ergocalciferol, (DRISDOL) 1.25 MG (50000 UT) CAPS capsule TAKE 1 CAPSULE ONCE A WEEK Patient taking differently: Take 50,000 Units by mouth every 7 (seven) days.  11/04/18  Yes Volanda Napoleon, MD    Family History Family History  Problem Relation Age of Onset  . Diabetes Mother   . Heart attack Mother   . Diabetes Father   . Heart attack Father     Social History Social History   Tobacco Use  . Smoking status: Never Smoker  . Smokeless tobacco: Never Used  . Tobacco comment: never used tobacco  Substance Use Topics  . Alcohol use: No    Alcohol/week: 0.0 standard drinks  . Drug use: No     Allergies   Codeine and Darvocet [propoxyphene n-acetaminophen]   Review of Systems Review of Systems  Constitutional: Negative for fever.  HENT: Negative for rhinorrhea and sore throat.   Eyes: Negative for redness.  Respiratory: Positive for shortness of breath. Negative for cough.   Cardiovascular: Positive for leg swelling. Negative for chest pain.  Gastrointestinal: Negative for abdominal pain, diarrhea, nausea and vomiting.  Genitourinary: Negative for dysuria.  Musculoskeletal: Negative for myalgias.  Skin: Positive for rash.  Neurological: Negative for headaches.     Physical Exam Updated Vital Signs BP (!) 149/61 (BP Location: Right Arm)   Pulse 90   Temp 97.8 F (36.6 C) (Oral)   Resp 18   Ht 5' 2.5" (1.588 m)   Wt 79.4 kg   SpO2 95%   BMI 31.51 kg/m   Physical Exam Vitals signs and nursing note reviewed.  Constitutional:      Appearance: She is well-developed.  HENT:     Head: Normocephalic and atraumatic.  Eyes:     General:        Right eye: No discharge.        Left eye: No discharge.     Conjunctiva/sclera: Conjunctivae normal.  Neck:     Musculoskeletal: Normal range of motion and neck supple.  Cardiovascular:     Rate and Rhythm: Normal rate and regular rhythm.     Heart sounds: Normal heart  sounds.  Pulmonary:     Effort: Pulmonary effort is normal.     Breath sounds: Normal breath sounds. No decreased breath sounds, wheezing, rhonchi or rales.  Abdominal:     Palpations: Abdomen is soft.     Tenderness: There is no abdominal tenderness.  Musculoskeletal:     Right lower leg: She exhibits tenderness (mild). Edema present.     Left lower leg: She exhibits tenderness (mild). Edema present.     Comments: Patient with 2-3+ bilateral lower extremity edema with sparing of the feet.  Generalized mild tenderness.  Symptoms are symmetric.  Left upper arm: Generalized edema noted.  No tenderness.  Skin:    General: Skin is warm and dry.     Comments:  Patient with bright red rash in the abdominal skin folds and also more mildly in her inguinal skin folds, with some satellite lesions consistent with yeast infection.  Neurological:     Mental Status: She is alert.      ED Treatments / Results  Labs (all labs ordered are listed, but only abnormal results are displayed) Labs Reviewed  CBC WITH DIFFERENTIAL/PLATELET - Abnormal; Notable for the following components:      Result Value   WBC 18.5 (*)    RBC 3.53 (*)    Hemoglobin 11.7 (*)    HCT 35.1 (*)    RDW 17.6 (*)    Platelets 115 (*)    Neutro Abs 12.4 (*)    Monocytes Absolute 2.6 (*)    Eosinophils Absolute 0.7 (*)    All other components within normal limits  COMPREHENSIVE METABOLIC PANEL - Abnormal; Notable for the following components:   Glucose, Bld 134 (*)    Calcium 8.5 (*)    Total Protein 5.1 (*)    Albumin 2.1 (*)    AST 102 (*)    Alkaline Phosphatase 229 (*)    Total Bilirubin 4.0 (*)    All other components within normal limits  BRAIN NATRIURETIC PEPTIDE  URINALYSIS, ROUTINE W REFLEX MICROSCOPIC  I-STAT TROPONIN, ED    EKG None  Radiology Dg Chest 2 View  Result Date: 11/04/2018 CLINICAL DATA:  Excessive lower extremity edema. EXAM: CHEST - 2 VIEW COMPARISON:  09/05/2017 FINDINGS: AP and  lateral views of the chest show cardiomegaly with low volumes. There is pulmonary vascular congestion without overt pulmonary edema. Tiny bilateral pleural effusions noted posteriorly. Subsegmental atelectasis or scarring evident left midlung. The visualized bony structures of the thorax are intact. Telemetry leads overlie the chest. IMPRESSION: Cardiomegaly with vascular congestion and tiny bilateral pleural effusions. Electronically Signed   By: Misty Stanley M.D.   On: 11/04/2018 16:14   Ue Venous Duplex ([facility] Only)  Result Date: 11/04/2018 UPPER VENOUS STUDY  Indications: Edema Performing Technologist: Toma Copier RVS  Examination Guidelines: A complete evaluation includes B-mode imaging, spectral Doppler, color Doppler, and power Doppler as needed of all accessible portions of each vessel. Bilateral testing is considered an integral part of a complete examination. Limited examinations for reoccurring indications may be performed as noted.  Right Findings: +----------+------------+----------+---------+-----------+-------+ RIGHT     CompressiblePropertiesPhasicitySpontaneousSummary +----------+------------+----------+---------+-----------+-------+ Subclavian                         Yes       Yes            +----------+------------+----------+---------+-----------+-------+  Left Findings: +----------+------------+----------+---------+-----------+-------+ LEFT      CompressiblePropertiesPhasicitySpontaneousSummary +----------+------------+----------+---------+-----------+-------+ IJV           Full                 Yes       Yes            +----------+------------+----------+---------+-----------+-------+ Subclavian                         Yes       Yes            +----------+------------+----------+---------+-----------+-------+ Axillary      Full                 Yes       Yes            +----------+------------+----------+---------+-----------+-------+  Brachial      Full                 Yes       Yes            +----------+------------+----------+---------+-----------+-------+ Radial        Full                                          +----------+------------+----------+---------+-----------+-------+ Ulnar         Full                                          +----------+------------+----------+---------+-----------+-------+ Cephalic      Full                                          +----------+------------+----------+---------+-----------+-------+ Basilic       Full                 Yes       Yes            +----------+------------+----------+---------+-----------+-------+  Summary:  Right: No evidence of thrombosis in the subclavian.  Left: No evidence of deep vein thrombosis in the upper extremity. No evidence of superficial vein thrombosis in the upper extremity. No evidence of thrombosis in the subclavian.  *See table(s) above for measurements and observations.    Preliminary     Procedures Procedures (including critical care time)  Medications Ordered in ED Medications - No data to display   Initial Impression / Assessment and Plan / ED Course  I have reviewed the triage vital signs and the nursing notes.  Pertinent labs & imaging results that were available during my care of the patient were reviewed by me and considered in my medical decision making (see chart for details).     Patient seen and examined. Work-up initiated.   Vital signs reviewed and are as follows: BP (!) 149/61 (BP Location: Right Arm)   Pulse 90   Temp 97.8 F (36.6 C) (Oral)   Resp 18   Ht 5' 2.5" (1.588 m)   Wt 79.4 kg   SpO2 95%   BMI 31.51 kg/m   EKG reviewed, unchanged from 2018.   4:35 PM Discussed with Dr. Vanita Panda who will see. + vascular congestion on xray. Lasix, clotrimazole cream ordered.   6:35 PM patient updated on results.  Spoke with Dr. Marlowe Sax who will see patient.   Final Clinical Impressions(s) /  ED Diagnoses   Final diagnoses:  Extremity edema  Shortness of breath  Intertrigo   Admit for treatment of edema causing SOB.   ED Discharge Orders    None       Carlisle Cater, Hershal Coria 11/04/18 1836    Carmin Muskrat, MD 11/05/18 2049

## 2018-11-04 NOTE — H&P (Signed)
History and Physical    Karen Richard SWN:[id number] DOB: [birth year] DOA: 11/04/2018  PCP: Caren Macadam, MD  Chief Complaint: Patient sent here from PCPs office for evaluation of excessive edema of bilateral lower extremities and left upper extremity.  HPI: Karen Richard is a 85 y.o. female with medical history significant of chronic diastolic congestive heart failure, anxiety, hyperlipidemia presenting to the hospital from her PCPs office for evaluation of excessive edema of bilateral lower extremities and left upper extremity.  Patient states her legs have been swollen for the past few months.  Edema has been getting progressively worse.  In addition, her left upper extremity has been swollen.  Husband states that patient's vascular surgeon had done tests on both of her legs about 2 to 3 weeks ago and they were told that there were no blood clots in her legs.  Husband also states that the patient has gained about 25 pounds in the past 1.5 months.  Patient reports having dyspnea on exertion for the past few months.  Denies having any orthopnea or paroxysmal nocturnal dyspnea.  Reports having a nonproductive cough.  States she has been taking her fluid pill at home as instructed.  Denies any dietary indiscretions such as increased fluid intake or increased sodium intake.  Denies having any dysuria, urinary frequency, or urgency.  States the skin under her abdominal fold is red.   Review of Systems: As per HPI otherwise 10 point review of systems negative.  Past Medical History:  Diagnosis Date  . Anxiety   . Arthritis   . Cancer ([place])   . Cataract   . Hyperlipidemia   . Neuromuscular disorder ([place])   . Swelling in chest    rigth breast where mastectomy was performed     Past Surgical History:  Procedure Laterality Date  . ABDOMINAL HYSTERECTOMY    . APPENDECTOMY    . BREAST SURGERY    . CHOLECYSTECTOMY    . EYE SURGERY    . JOINT REPLACEMENT       reports that she has  never smoked. She has never used smokeless tobacco. She reports that she does not drink alcohol or use drugs.  Allergies  Allergen Reactions  . Codeine Nausea Only  . Darvocet [Propoxyphene N-Acetaminophen] Nausea Only    Family History  Problem Relation Age of Onset  . Diabetes Mother   . Heart attack Mother   . Diabetes Father   . Heart attack Father     Prior to Admission medications   Medication Sig Start Date End Date Taking? Authorizing Provider  Acetaminophen (TYLENOL ARTHRITIS EXT RELIEF PO) Take 1 tablet by mouth as needed (pain).    Yes [provider]  amoxicillin (AMOXIL) 500 MG tablet Take 2,000 mg by mouth See admin instructions. Take 2063m 1 hour before dental appointments   Yes [provider]  aspirin (ASPIRIN EC) 81 MG EC tablet Take 81 mg by mouth every morning.    Yes [provider]  furosemide (LASIX) 20 MG tablet Take 2 tablets (40 mg total) 2 (two) times daily by mouth. Patient taking differently: Take 10 mg by mouth every other day.  09/08/17  Yes MGeorgette Shell MD  LORazepam (ATIVAN) 0.5 MG tablet TAKE 1 TABLET BY MOUTH EVERY DAY AS NEEDED FOR ANXIETY Patient taking differently: Take 0.5 mg by mouth daily as needed for anxiety.  01/28/14  Yes EVolanda Napoleon MD  Vitamin D, Ergocalciferol, (DRISDOL) 1.25 MG (50000 UT) CAPS capsule TAKE  1 CAPSULE ONCE A WEEK Patient taking differently: Take 50,000 Units by mouth every 7 (seven) days.  11/04/18  Yes Volanda Napoleon, MD    Physical Exam: Vitals:   11/04/18 1439 11/04/18 1440 11/04/18 1818 11/04/18 1830  BP: (!) 149/61   (!) 142/56  Pulse: 90   88  Resp: 18   12  Temp: 97.8 F (36.6 C)     TempSrc: Oral     SpO2: 95%  95% 95%  Weight:  79.4 kg    Height:  5' 2.5" (1.588 m)      Physical Exam  Constitutional: She is oriented to person, place, and time. She appears well-developed and well-nourished. No distress.  HENT:  Head: Normocephalic.  Mouth/Throat: Oropharynx  is clear and moist.  Eyes: Right eye exhibits no discharge. Left eye exhibits no discharge.  Neck: JVD present.  Mild JVD  Cardiovascular: Normal rate, regular rhythm and intact distal pulses.  Pulmonary/Chest: Effort normal. No respiratory distress. She has no wheezes. She has no rales.  Abdominal: Soft. Bowel sounds are normal. She exhibits no distension. There is no abdominal tenderness. There is no guarding.  Musculoskeletal:        General: Edema present.     Comments: +3 to +4 pitting edema of bilateral lower extremities with chronic venous stasis dermatitis. Nonpitting edema of left upper extremity noted.  Neurological: She is alert and oriented to person, place, and time.  Skin: Skin is warm and dry. Rash noted. She is not diaphoretic.  Intertrigo of panniculus noted abdominal skin fold noted.  Area is foul-smelling.  Please see image.  Psychiatric: She has a normal mood and affect. Her behavior is normal.       Labs on Admission: I have personally reviewed following labs and imaging studies  CBC: Recent Labs  Lab 11/04/18 1550  WBC 18.5*  NEUTROABS 12.4*  HGB 11.7*  HCT 35.1*  MCV 99.4  PLT 496*   Basic Metabolic Panel: Recent Labs  Lab 11/04/18 1550  NA 135  K 4.1  CL 103  CO2 26  GLUCOSE 134*  BUN 12  CREATININE 0.82  CALCIUM 8.5*   GFR: Estimated Creatinine Clearance: 45.9 mL/min (by C-G formula based on SCr of 0.82 mg/dL). Liver Function Tests: Recent Labs  Lab 11/04/18 1550  AST 102*  ALT 36  ALKPHOS 229*  BILITOT 4.0*  PROT 5.1*  ALBUMIN 2.1*   No results for input(s): LIPASE, AMYLASE in the last 168 hours. No results for input(s): AMMONIA in the last 168 hours. Coagulation Profile: No results for input(s): INR, PROTIME in the last 168 hours. Cardiac Enzymes: No results for input(s): CKTOTAL, CKMB, CKMBINDEX, TROPONINI in the last 168 hours. BNP (last 3 results) No results for input(s): PROBNP in the last 8760 hours. HbA1C: No results  for input(s): HGBA1C in the last 72 hours. CBG: No results for input(s): GLUCAP in the last 168 hours. Lipid Profile: No results for input(s): CHOL, HDL, LDLCALC, TRIG, CHOLHDL, LDLDIRECT in the last 72 hours. Thyroid Function Tests: No results for input(s): TSH, T4TOTAL, FREET4, T3FREE, THYROIDAB in the last 72 hours. Anemia Panel: No results for input(s): VITAMINB12, FOLATE, FERRITIN, TIBC, IRON, RETICCTPCT in the last 72 hours. Urine analysis:    Component Value Date/Time   COLORURINE YELLOW 09/05/2017 1155   APPEARANCEUR CLEAR 09/05/2017 1155   LABSPEC 1.004 (L) 09/05/2017 1155   PHURINE 5.0 09/05/2017 1155   GLUCOSEU NEGATIVE 09/05/2017 1155   HGBUR NEGATIVE 09/05/2017 1155   HGBUR  negative 02/01/2010 1012   BILIRUBINUR NEGATIVE 09/05/2017 1155   BILIRUBINUR neg 01/11/2011 1449   KETONESUR NEGATIVE 09/05/2017 1155   PROTEINUR NEGATIVE 09/05/2017 1155   UROBILINOGEN 0.2 01/11/2011 1449   UROBILINOGEN 0.2 02/01/2010 1012   NITRITE NEGATIVE 09/05/2017 1155   LEUKOCYTESUR NEGATIVE 09/05/2017 1155    Radiological Exams on Admission: Dg Chest 2 View  Result Date: 11/04/2018 CLINICAL DATA:  Excessive lower extremity edema. EXAM: CHEST - 2 VIEW COMPARISON:  09/05/2017 FINDINGS: AP and lateral views of the chest show cardiomegaly with low volumes. There is pulmonary vascular congestion without overt pulmonary edema. Tiny bilateral pleural effusions noted posteriorly. Subsegmental atelectasis or scarring evident left midlung. The visualized bony structures of the thorax are intact. Telemetry leads overlie the chest. IMPRESSION: Cardiomegaly with vascular congestion and tiny bilateral pleural effusions. Electronically Signed   By: Misty Stanley M.D.   On: 11/04/2018 16:14   Ue Venous Duplex ([facility] Only)  Result Date: 11/04/2018 UPPER VENOUS STUDY  Indications: Edema Performing Technologist: Toma Copier RVS  Examination Guidelines: A complete evaluation includes B-mode imaging,  spectral Doppler, color Doppler, and power Doppler as needed of all accessible portions of each vessel. Bilateral testing is considered an integral part of a complete examination. Limited examinations for reoccurring indications may be performed as noted.  Right Findings: +----------+------------+----------+---------+-----------+-------+ RIGHT     CompressiblePropertiesPhasicitySpontaneousSummary +----------+------------+----------+---------+-----------+-------+ Subclavian                         Yes       Yes            +----------+------------+----------+---------+-----------+-------+  Left Findings: +----------+------------+----------+---------+-----------+-------+ LEFT      CompressiblePropertiesPhasicitySpontaneousSummary +----------+------------+----------+---------+-----------+-------+ IJV           Full                 Yes       Yes            +----------+------------+----------+---------+-----------+-------+ Subclavian                         Yes       Yes            +----------+------------+----------+---------+-----------+-------+ Axillary      Full                 Yes       Yes            +----------+------------+----------+---------+-----------+-------+ Brachial      Full                 Yes       Yes            +----------+------------+----------+---------+-----------+-------+ Radial        Full                                          +----------+------------+----------+---------+-----------+-------+ Ulnar         Full                                          +----------+------------+----------+---------+-----------+-------+ Cephalic      Full                                          +----------+------------+----------+---------+-----------+-------+  Basilic       Full                 Yes       Yes            +----------+------------+----------+---------+-----------+-------+  Summary:  Right: No evidence of thrombosis in the  subclavian.  Left: No evidence of deep vein thrombosis in the upper extremity. No evidence of superficial vein thrombosis in the upper extremity. No evidence of thrombosis in the subclavian.  *See table(s) above for measurements and observations.    Preliminary     EKG: Independently reviewed.  Sinus rhythm, LBBB.  No significant change since prior tracing.  Assessment/Plan Principal Problem:   Acute exacerbation of CHF (congestive heart failure) (HCC) Active Problems:   Edema of upper extremity   Intertrigo   Leukocytosis   Hypoalbuminemia   Elevated LFTs   Anxiety   Chronic back pain   Acute exacerbation of chronic diastolic congestive heart failure -Presenting with several months of dyspnea on exertion, weight gain, and progressively worse bilateral lower extremity edema. -BNP normal, possibly falsely low in the setting of obesity.  Has mild JVD on exam and chest x-ray showing cardiomegaly with vascular congestion and tiny bilateral pleural effusions. -Echo done in November 2018 showing normal systolic function and grade 1 diastolic dysfunction. -Right lower extremity Doppler ordered by vascular surgery in December 2019 negative for DVT.  Patient has symmetric bilateral lower extremity edema with venous stasis dermatitis. -Repeat echocardiogram -IV Lasix 40 mg ordered in the ED.  Continue IV Lasix 40 mg twice daily starting tomorrow morning. -Monitor intake and output -Daily weights -Low-sodium diet with fluid restriction  Left upper extremity edema Left upper extremity Doppler done today negative for DVT.  Patient has hypoalbuminemia, which could also be contributing to her edema.  Also has bilateral lower extremity edema.  Hypoalbuminemia -Nutrition consult  Intertrigo of panniculus -Clotrimazole topically twice daily  Leukocytosis History of CLL.  Followed by oncology (Dr. Marin Olp).  Last office visit in October 2019.  Her chronic leukocytosis is thought to be secondary to  CLL.  White count 18.5, previously elevated as well.  Patient is afebrile and nontoxic-appearing.  Chest x-ray not suggestive of pneumonia.  Patient does not endorse any UTI symptoms. -Continue to monitor  Elevated LFTs LFTs chronically elevated.  She has a history of prior cholecystectomy.  Abdominal ultrasound done in August 2017 suggestive of liver cirrhosis. -Outpatient follow-up  Anxiety -Continue home Ativan 0.5 mg daily PRN  Chronic back pain -Tylenol 1000 mg every 8 hours as needed  DVT prophylaxis: Lovenox Code Status: Patient wishes to be DNR. Family Communication: Husband at bedside Disposition Plan: Anticipate discharge in 1 to 2 days. Consults called: None Admission status: Observation, telemetry   Shela Leff MD [facility] Pager [phone]  If 7PM-7AM, please contact night-coverage [email or web address] Password [facility]  11/04/2018, 7:38 PM

## 2018-11-04 NOTE — ED Triage Notes (Signed)
Pt sent from PCP office for evaluation of excessive edema to bilateral feet and Lt hand. Pt reports the swelling in lt hand started Sunday.

## 2018-11-04 NOTE — Progress Notes (Signed)
Left lower extremity venous duplex completed - There is no evidence of DVT or superficial thrombosis. [place] Nate Common,RVS 11/04/2018, 4:49 PM

## 2018-11-05 ENCOUNTER — Observation Stay (HOSPITAL_BASED_OUTPATIENT_CLINIC_OR_DEPARTMENT_OTHER): Payer: Medicare Other

## 2018-11-05 DIAGNOSIS — R600 Localized edema: Secondary | ICD-10-CM | POA: Diagnosis not present

## 2018-11-05 DIAGNOSIS — E8809 Other disorders of plasma-protein metabolism, not elsewhere classified: Secondary | ICD-10-CM | POA: Diagnosis not present

## 2018-11-05 DIAGNOSIS — I5033 Acute on chronic diastolic (congestive) heart failure: Secondary | ICD-10-CM | POA: Diagnosis not present

## 2018-11-05 DIAGNOSIS — R945 Abnormal results of liver function studies: Secondary | ICD-10-CM | POA: Diagnosis not present

## 2018-11-05 DIAGNOSIS — I361 Nonrheumatic tricuspid (valve) insufficiency: Secondary | ICD-10-CM

## 2018-11-05 LAB — ECHOCARDIOGRAM COMPLETE
HEIGHTINCHES: 62 in
Weight: 3204.8 oz

## 2018-11-05 LAB — CBC
HCT: 31.3 % — ABNORMAL LOW (ref 36.0–46.0)
Hemoglobin: 11 g/dL — ABNORMAL LOW (ref 12.0–15.0)
MCH: 34.7 pg — ABNORMAL HIGH (ref 26.0–34.0)
MCHC: 35.1 g/dL (ref 30.0–36.0)
MCV: 98.7 fL (ref 80.0–100.0)
PLATELETS: 106 10*3/uL — AB (ref 150–400)
RBC: 3.17 MIL/uL — ABNORMAL LOW (ref 3.87–5.11)
RDW: 17.5 % — ABNORMAL HIGH (ref 11.5–15.5)
WBC: 14 10*3/uL — ABNORMAL HIGH (ref 4.0–10.5)
nRBC: 0 % (ref 0.0–0.2)

## 2018-11-05 LAB — BASIC METABOLIC PANEL
ANION GAP: 4 — AB (ref 5–15)
BUN: 14 mg/dL (ref 8–23)
CO2: 30 mmol/L (ref 22–32)
Calcium: 8.4 mg/dL — ABNORMAL LOW (ref 8.9–10.3)
Chloride: 105 mmol/L (ref 98–111)
Creatinine, Ser: 0.85 mg/dL (ref 0.44–1.00)
GFR calc non Af Amer: >60 mL/min (ref >60)
Glucose, Bld: 115 mg/dL — ABNORMAL HIGH (ref 70–99)
Potassium: 4.5 mmol/L (ref 3.5–5.1)
Sodium: 139 mmol/L (ref 135–145)

## 2018-11-05 NOTE — Progress Notes (Signed)
Nutrition Brief Note  Patient identified on the Malnutrition Screening Tool (MST) Report  Wt Readings from Last 15 Encounters:  11/05/18 90.9 kg  10/18/18 79.4 kg  08/07/18 79.8 kg  01/22/18 79.9 kg  10/29/17 79.1 kg  09/08/17 79.3 kg  07/11/17 81.2 kg  06/12/17 82.1 kg  02/09/17 81.2 kg  10/10/16 79.8 kg  07/10/16 78.9 kg  05/30/16 78.5 kg  03/31/16 79.7 kg  02/29/16 77.6 kg  02/07/16 77.6 kg   Karen Richard is a 85 y.o. female with medical history significant of chronic diastolic congestive heart failure, anxiety, hyperlipidemia presenting to the hospital from her PCPs office for evaluation of excessive edema of bilateral lower extremities and left upper extremity.  Patient states her legs have been swollen for the past few months.  Edema has been getting progressively worse.  In addition, her left upper extremity has been swollen.  Husband states that patient's vascular surgeon had done tests on both of her legs about 2 to 3 weeks ago and they were told that there were no blood clots in her legs.  Husband also states that the patient has gained about 25 pounds in the past 1.5 months.  Patient reports having dyspnea on exertion for the past few months.  Denies having any orthopnea or paroxysmal nocturnal dyspnea.  Reports having a nonproductive cough.  States she has been taking her fluid pill at home as instructed.  Denies any dietary indiscretions such as increased fluid intake or increased sodium intake.  Denies having any dysuria, urinary frequency, or urgency.  States the skin under her abdominal fold is red.   Pt admitted with acute CHF exacerbation.  Reviewed I/O's: -900 ml x 24 hours  Spoke with pt at bedside, who reports that she typically has a great appetite. She shares that she lives at [employer] and has access to a [employer], [employer], and creamery. Pt typically consumes 3 meals per day- pt shares she will usually get a serving of meat from the dining facility  and bring it home and supplement with favorite sides. Pt shares that she adds salt to foods and has been consuming convenience snacks, such as potato chips.   Pt denies any weight loss. She reports her UBW is around 178#, which she was weighing consistently until a few days ago (she weighed 206# at MD office and was directly admitted).   Pt admits to diet indiscretion; discussed importance of limiting sodium in diet to prevent fluid overload. Discussed ways to season foods other than salt as well as healthier snacks.   Nutrition-Focused physical exam completed. Findings are no fat depletion, no muscle depletion, and mild edema.   Albumin has a half-life of 21 days and is strongly affected by stress response and inflammatory process, therefore, do not expect to see an improvement in this lab value during acute hospitalization. When a patient presents with low albumin, it is likely skewed due to the acute inflammatory response.  Unless it is suspected that patient had poor PO intake or malnutrition prior to admission, then RD should not be consulted solely for low albumin. Note that low albumin is no longer used to diagnose malnutrition; [place] [facility] uses the new malnutrition guidelines published by the American Society for Parenteral and Enteral Nutrition (A.S.P.E.N.) and the Academy of Nutrition and Dietetics (AND).    Labs reviewed.   Body mass index is 36.64 kg/m. Patient meets criteria for obesity, class II based on current BMI.   Current diet  order is 2 gram sodium with 1.2 L fluid restriction, patient is consuming approximately 25-75% of meals at this time. Labs and medications reviewed.   No nutrition interventions warranted at this time. If nutrition issues arise, please consult RD.   Takeem Krotzer A. Jimmye Norman, RD, LDN, CDE Pager: [phone] After hours Pager: [phone]

## 2018-11-05 NOTE — Progress Notes (Signed)
Progress Note    Karen Richard  LNL:[id number] DOB: 21-Aug-1927  DOA: 11/04/2018 PCP: Caren Macadam, MD    Brief Narrative:   Medical records reviewed and are as summarized below:  Karen Richard is an 85 y.o. female with medical history significant of chronic diastolic congestive heart failure, anxiety, hyperlipidemia presenting to the hospital from her PCPs office for evaluation of excessive edema of bilateral lower extremities and left upper extremity.  Patient states her legs have been swollen for the past few months.  Edema has been getting progressively worse.  Assessment/Plan:   Principal Problem:   Acute exacerbation of CHF (congestive heart failure) (HCC) Active Problems:   Edema of upper extremity   Intertrigo   Leukocytosis   Hypoalbuminemia   Elevated LFTs   Anxiety   Chronic back pain  Acute exacerbation of chronic diastolic congestive heart failure -Presenting with several months of dyspnea on exertion, weight gain, and progressively worse bilateral lower extremity edema. -Echo done in November 2018 showing normal systolic function and grade 1 diastolic dysfunction--- repeat echo -Right lower extremity Doppler ordered by vascular surgery in December 2019 negative for DVT.  Patient has symmetric bilateral lower extremity edema -- ? lymphedema -IV Lasix 40 mg ordered in the ED.  Continue IV Lasix 40 mg twice daily starting tomorrow morning. -Monitor intake and output -Daily weights -Low-sodium diet with fluid restriction- needs dietary education  Left upper extremity edema Left upper extremity Doppler done today negative for DVT.  Patient has hypoalbuminemia, which could also be contributing to her edema.  Also has bilateral lower extremity edema.  Hypoalbuminemia -Nutrition consult -related to ? Liver cirrhosis  Intertrigo of panniculus -Clotrimazole topically twice daily  Leukocytosis History of CLL.  Followed by oncology (Dr. Marin Olp).  Last  office visit in October 2019.  Her chronic leukocytosis is thought to be secondary to CLL.  White count 18.5, previously elevated as well.  Patient is afebrile and nontoxic-appearing.  Chest x-ray not suggestive of pneumonia.  Patient does not endorse any UTI symptoms.  Elevated LFTs/bilirubin LFTs chronically elevated.  She has a history of prior cholecystectomy.  Abdominal ultrasound done in August 2017 suggestive of liver cirrhosis. -Outpatient follow-up -limit tylenol  Anxiety -Continue home Ativan 0.5 mg daily PRN  Chronic back pain -hold tylenol for now  obesity Body mass index is 36.64 kg/m.   Family Communication/Anticipated D/C date and plan/Code Status   DVT prophylaxis: Lovenox ordered. Code Status: dnr Family Communication: none at bedside Disposition Plan: suspect 24 more hours of IV lasix and will also need education regarding diet   Medical Consultants:    none  Subjective:   Has been eating chips lately, does not like protein  Objective:    Vitals:   11/04/18 2224 11/05/18 0105 11/05/18 0106 11/05/18 1146  BP: (!) 157/60  (!) 139/49 130/64  Pulse: 87  90 (!) 101  Resp:   18 20  Temp: 97.6 F (36.4 C)  98.4 F (36.9 C) 97.8 F (36.6 C)  TempSrc: Oral  Oral Oral  SpO2: 95%  97% 97%  Weight: 91 kg 90.9 kg    Height: 5' 2"  (1.575 m)       Intake/Output Summary (Last 24 hours) at 11/05/2018 1222 Last data filed at 11/05/2018 1100 Gross per 24 hour  Intake 240 ml  Output 1200 ml  Net -960 ml   Filed Weights   11/04/18 1440 11/04/18 2224 11/05/18 0105  Weight: 79.4 kg 91 kg 90.9 kg  Exam: Awake, NAD +LE edema and ?lymphedema? +BS, soft rrr Crackles at bases A+Ox3  Data Reviewed:   I have personally reviewed following labs and imaging studies:  Labs: Labs show the following:   Basic Metabolic Panel: Recent Labs  Lab 11/04/18 1550 11/05/18 0522  NA 135 139  K 4.1 4.5  CL 103 105  CO2 26 30  GLUCOSE 134* 115*  BUN 12 14   CREATININE 0.82 0.85  CALCIUM 8.5* 8.4*   GFR Estimated Creatinine Clearance: 47 mL/min (by C-G formula based on SCr of 0.85 mg/dL). Liver Function Tests: Recent Labs  Lab 11/04/18 1550  AST 102*  ALT 36  ALKPHOS 229*  BILITOT 4.0*  PROT 5.1*  ALBUMIN 2.1*   No results for input(s): LIPASE, AMYLASE in the last 168 hours. No results for input(s): AMMONIA in the last 168 hours. Coagulation profile No results for input(s): INR, PROTIME in the last 168 hours.  CBC: Recent Labs  Lab 11/04/18 1550 11/05/18 0522  WBC 18.5* 14.0*  NEUTROABS 12.4*  --   HGB 11.7* 11.0*  HCT 35.1* 31.3*  MCV 99.4 98.7  PLT 115* 106*   Cardiac Enzymes: No results for input(s): CKTOTAL, CKMB, CKMBINDEX, TROPONINI in the last 168 hours. BNP (last 3 results) No results for input(s): PROBNP in the last 8760 hours. CBG: No results for input(s): GLUCAP in the last 168 hours. D-Dimer: No results for input(s): DDIMER in the last 72 hours. Hgb A1c: No results for input(s): HGBA1C in the last 72 hours. Lipid Profile: No results for input(s): CHOL, HDL, LDLCALC, TRIG, CHOLHDL, LDLDIRECT in the last 72 hours. Thyroid function studies: No results for input(s): TSH, T4TOTAL, T3FREE, THYROIDAB in the last 72 hours.  Invalid input(s): FREET3 Anemia work up: No results for input(s): VITAMINB12, FOLATE, FERRITIN, TIBC, IRON, RETICCTPCT in the last 72 hours. Sepsis Labs: Recent Labs  Lab 11/04/18 1550 11/05/18 0522  WBC 18.5* 14.0*    Microbiology No results found for this or any previous visit (from the past 240 hour(s)).  Procedures and diagnostic studies:  Dg Chest 2 View  Result Date: 11/04/2018 CLINICAL DATA:  Excessive lower extremity edema. EXAM: CHEST - 2 VIEW COMPARISON:  09/05/2017 FINDINGS: AP and lateral views of the chest show cardiomegaly with low volumes. There is pulmonary vascular congestion without overt pulmonary edema. Tiny bilateral pleural effusions noted posteriorly.  Subsegmental atelectasis or scarring evident left midlung. The visualized bony structures of the thorax are intact. Telemetry leads overlie the chest. IMPRESSION: Cardiomegaly with vascular congestion and tiny bilateral pleural effusions. Electronically Signed   By: Misty Stanley M.D.   On: 11/04/2018 16:14   Ue Venous Duplex ([facility] And [facility] Only)  Result Date: 11/04/2018 UPPER VENOUS STUDY  Indications: Edema Performing Technologist: Toma Copier RVS  Examination Guidelines: A complete evaluation includes B-mode imaging, spectral Doppler, color Doppler, and power Doppler as needed of all accessible portions of each vessel. Bilateral testing is considered an integral part of a complete examination. Limited examinations for reoccurring indications may be performed as noted.  Right Findings: +----------+------------+----------+---------+-----------+-------+ RIGHT     CompressiblePropertiesPhasicitySpontaneousSummary +----------+------------+----------+---------+-----------+-------+ Subclavian                         Yes       Yes            +----------+------------+----------+---------+-----------+-------+  Left Findings: +----------+------------+----------+---------+-----------+-------+ LEFT      CompressiblePropertiesPhasicitySpontaneousSummary +----------+------------+----------+---------+-----------+-------+ IJV  Full                 Yes       Yes            +----------+------------+----------+---------+-----------+-------+ Subclavian                         Yes       Yes            +----------+------------+----------+---------+-----------+-------+ Axillary      Full                 Yes       Yes            +----------+------------+----------+---------+-----------+-------+ Brachial      Full                 Yes       Yes            +----------+------------+----------+---------+-----------+-------+ Radial        Full                                           +----------+------------+----------+---------+-----------+-------+ Ulnar         Full                                          +----------+------------+----------+---------+-----------+-------+ Cephalic      Full                                          +----------+------------+----------+---------+-----------+-------+ Basilic       Full                 Yes       Yes            +----------+------------+----------+---------+-----------+-------+  Summary:  Right: No evidence of thrombosis in the subclavian.  Left: No evidence of deep vein thrombosis in the upper extremity. No evidence of superficial vein thrombosis in the upper extremity. No evidence of thrombosis in the subclavian.  *See table(s) above for measurements and observations.    Preliminary     Medications:   . aspirin EC  81 mg Oral q morning - 10a  . clotrimazole   Topical BID  . dextromethorphan-guaiFENesin  1 tablet Oral BID  . enoxaparin (LOVENOX) injection  40 mg Subcutaneous Q24H  . furosemide  40 mg Intravenous BID   Continuous Infusions:   LOS: 0 days   Geradine Girt  [facility]   *Please refer to [email or web address], password [email or web address] to get updated schedule on who will round on this patient, as hospitalists switch teams weekly. If 7PM-7AM, please contact night-coverage at [email or web address], password [email or web address] for any overnight needs.  11/05/2018, 12:22 PM

## 2018-11-05 NOTE — Care Management Note (Signed)
Case Management Note  Patient Details  Name: Karen Richard MRN: [id number] Date of Birth: [birth year]  Subjective/Objective: 85 yo female presented with excessive edema of bilateral LE and LUE.                  Action/Plan: CM met with patient to discuss dispositional needs. Patient reports living at home with spouse at [place], independent with ADLs, has a RW/SPC to assist with ambulation. PCP verified as: Dr. Caren Macadam; Pharmacy: [facility]. PT eval complete with HHPT with supervision recommended; CMS HH compare list provided with [facility] selected, with patient indicating her spouse can provide assistance as needed. [place] referral given to [place], [place]; AVS updated. Patient stated her spouse can provide transportation home. No further needs from CM.  Expected Discharge Date:                  Expected Discharge Plan:  [place]  In-House Referral:  NA  Discharge planning Services  CM Consult  Post Acute Care Choice:  Home Health Choice offered to:  Patient  DME Arranged:  N/A DME Agency:  NA  HH Arranged:  PT [place] Agency:  [facility] (now [facility])  Status of Service:  In process, will continue to follow  If discussed at Long Length of Stay Meetings, dates discussed:    Additional Comments:  Midge Minium RN, BSN, NCM-BC, ACM-RN [phone] 11/05/2018, 3:42 PM

## 2018-11-05 NOTE — Care Management Obs Status (Signed)
[place] NOTIFICATION   Patient Details  Name: ELINE GENG MRN: [id number] Date of Birth: [birth year]   Medicare Observation Status Notification Given:  Yes    Midge Minium RN, BSN, NCM-BC, ACM-RN [phone] 11/05/2018, 3:40 PM

## 2018-11-05 NOTE — Progress Notes (Signed)
  Echocardiogram 2D Echocardiogram has been performed.  Karen Richard L Androw 11/05/2018, 1:43 PM

## 2018-11-05 NOTE — Evaluation (Signed)
Physical Therapy Evaluation Patient Details Name: Karen Richard MRN: [id number] DOB: 12-04-1926 Today's Date: 11/05/2018   History of Present Illness  Patient is a 85 y/o female who presents from PCP office secondary to BLE edema and Left hand swelling. Admitted with CHF exacerbation. PMH includes breast ca s/p bil mastectomy, OA, questionable NASH, HLD.   Clinical Impression  Patient presents with generalized weakness, pitting edema BLEs, impaired balance, decreased endurance and impaired mobility s/p above. Tolerated gait training with Min A (HHA) for balance/safety. Pt furniture walker PTA and lives with spouse at [place]. Encouraged walking with nursing daily. Would really benefit from using RW for mobility for safety and to improve endurance however pt reluctant. Will follow acutely to maximize independence and mobility prior to return home.    Follow Up Recommendations Home health PT;Supervision for mobility/OOB    Equipment Recommendations  None recommended by PT    Recommendations for Other Services       Precautions / Restrictions Precautions Precautions: Fall Restrictions Weight Bearing Restrictions: No      Mobility  Bed Mobility Overal bed mobility: Needs Assistance Bed Mobility: Rolling;Sidelying to Sit;Sit to Sidelying Rolling: Supervision Sidelying to sit: Supervision;HOB elevated     Sit to sidelying: Supervision;HOB elevated General bed mobility comments: Able to get to EOB with use of rail, no assist needed.   Transfers Overall transfer level: Needs assistance Equipment used: None Transfers: Sit to/from Stand Sit to Stand: Min guard         General transfer comment: Min guard for safety. Slow to rise with increased effort and reaching for tray table for support.   Ambulation/Gait Ambulation/Gait assistance: Min assist Gait Distance (Feet): 100 Feet Assistive device: 1 person hand held assist Gait Pattern/deviations: Step-through  pattern;Decreased stride length;Wide base of support Gait velocity: decreased   General Gait Details: Slow, waddling like gait with HHA (Min A) on 1 UE and holding onto rail with other UE. 2/4 DOE. SP02 92%. HR stable 100-115 bpm.  Stairs            Wheelchair Mobility    Modified Rankin (Stroke Patients Only)       Balance Overall balance assessment: Needs assistance Sitting-balance support: Feet supported;No upper extremity supported Sitting balance-Leahy Scale: Fair     Standing balance support: During functional activity Standing balance-Leahy Scale: Fair Standing balance comment: Does better with UE support for dynamic tasks.                             Pertinent Vitals/Pain Pain Assessment: Faces Faces Pain Scale: Hurts little more Pain Location: back-chronic Pain Descriptors / Indicators: Aching;Discomfort;Guarding Pain Intervention(s): Monitored during session;Repositioned    Home Living Family/patient expects to be discharged to:: Private residence([facility]) Living Arrangements: Spouse/significant other Available Help at Discharge: Family;Available 24 hours/day Type of Home: Independent living facility Home Access: [place]: One level Home Equipment: [place] - 2 wheels;Cane - single point      Prior Function Level of Independence: Needs assistance         Comments: Furniture walker at baseline per report. DOes not drive anymore- plans on giving it up. Reports no falls. Walks to [employer] for some meals, cooks some. Reports chronic back problems from having to help husband after his CABG surgery last year.      Hand Dominance        Extremity/Trunk Assessment   Upper Extremity Assessment  Upper Extremity Assessment: Defer to OT evaluation    Lower Extremity Assessment Lower Extremity Assessment: Generalized weakness;LLE deficits/detail;RLE deficits/detail RLE Deficits / Details: pitting edema throughout LE  and into foot 2-3+ LLE Deficits / Details: pitting edema throughout LE and into foot 2-3+       Communication   Communication: HOH  Cognition Arousal/Alertness: Awake/alert Behavior During Therapy: WFL for tasks assessed/performed Overall Cognitive Status: Within Functional Limits for tasks assessed                                 General Comments: question safety awareness/awareness of deficits. Declines using any DME despite holding into things in room/hall      General Comments      Exercises     Assessment/Plan    PT Assessment Patient needs continued PT services  PT Problem List Decreased strength;Decreased balance;Decreased skin integrity;Decreased safety awareness;Decreased mobility;Decreased knowledge of use of DME;Decreased activity tolerance       PT Treatment Interventions DME instruction;Functional mobility training;Balance training;Patient/family education;Gait training;Therapeutic activities;Therapeutic exercise    PT Goals (Current goals can be found in the Care Plan section)  Acute Rehab PT Goals Patient Stated Goal: to return home and get some sleep PT Goal Formulation: With patient Time For Goal Achievement: 11/19/18 Potential to Achieve Goals: Good    Frequency Min 3X/week   Barriers to discharge        Co-evaluation               AM-PAC PT "6 Clicks" Mobility  Outcome Measure Help needed turning from your back to your side while in a flat bed without using bedrails?: A Little Help needed moving from lying on your back to sitting on the side of a flat bed without using bedrails?: A Little Help needed moving to and from a bed to a chair (including a wheelchair)?: None Help needed standing up from a chair using your arms (e.g., wheelchair or bedside chair)?: None Help needed to walk in hospital room?: A Little Help needed climbing 3-5 steps with a railing? : A Little 6 Click Score: 20    End of Session Equipment Utilized  During Treatment: Gait belt Activity Tolerance: Patient limited by fatigue Patient left: in bed;with call bell/phone within reach;with bed alarm set Nurse Communication: Mobility status PT Visit Diagnosis: Pain;Difficulty in walking, not elsewhere classified (R26.2);Unsteadiness on feet (R26.81) Pain - part of body: (back)    Time: 0752-0807 PT Time Calculation (min) (ACUTE ONLY): 15 min   Charges:   PT Evaluation $PT Eval Low Complexity: 1 Low          Wray Kearns, PT, DPT Acute Rehabilitation Services Pager [phone] Office [phone]      [place] 11/05/2018, 9:10 AM

## 2018-11-06 DIAGNOSIS — R600 Localized edema: Secondary | ICD-10-CM | POA: Diagnosis not present

## 2018-11-06 DIAGNOSIS — F419 Anxiety disorder, unspecified: Secondary | ICD-10-CM | POA: Diagnosis not present

## 2018-11-06 DIAGNOSIS — I5033 Acute on chronic diastolic (congestive) heart failure: Secondary | ICD-10-CM | POA: Diagnosis not present

## 2018-11-06 DIAGNOSIS — M549 Dorsalgia, unspecified: Secondary | ICD-10-CM | POA: Diagnosis not present

## 2018-11-06 LAB — COMPREHENSIVE METABOLIC PANEL
ALT: 34 U/L (ref 0–44)
AST: 98 U/L — ABNORMAL HIGH (ref 15–41)
Albumin: 2 g/dL — ABNORMAL LOW (ref 3.5–5.0)
Alkaline Phosphatase: 199 U/L — ABNORMAL HIGH (ref 38–126)
Anion gap: 7 (ref 5–15)
BUN: 16 mg/dL (ref 8–23)
CO2: 30 mmol/L (ref 22–32)
CREATININE: 0.82 mg/dL (ref 0.44–1.00)
Calcium: 8.4 mg/dL — ABNORMAL LOW (ref 8.9–10.3)
Chloride: 100 mmol/L (ref 98–111)
GFR calc Af Amer: >60 mL/min (ref >60)
GFR calc non Af Amer: >60 mL/min (ref >60)
Glucose, Bld: 103 mg/dL — ABNORMAL HIGH (ref 70–99)
Potassium: 3.9 mmol/L (ref 3.5–5.1)
Sodium: 137 mmol/L (ref 135–145)
Total Bilirubin: 4.1 mg/dL — ABNORMAL HIGH (ref 0.3–1.2)
Total Protein: 5 g/dL — ABNORMAL LOW (ref 6.5–8.1)

## 2018-11-06 LAB — CBC
HCT: 33.4 % — ABNORMAL LOW (ref 36.0–46.0)
Hemoglobin: 11.4 g/dL — ABNORMAL LOW (ref 12.0–15.0)
MCH: 33.1 pg (ref 26.0–34.0)
MCHC: 34.1 g/dL (ref 30.0–36.0)
MCV: 97.1 fL (ref 80.0–100.0)
NRBC: 0 % (ref 0.0–0.2)
PLATELETS: 128 10*3/uL — AB (ref 150–400)
RBC: 3.44 MIL/uL — ABNORMAL LOW (ref 3.87–5.11)
RDW: 17.2 % — ABNORMAL HIGH (ref 11.5–15.5)
WBC: 18.2 10*3/uL — ABNORMAL HIGH (ref 4.0–10.5)

## 2018-11-06 MED ORDER — POTASSIUM CHLORIDE CRYS ER 20 MEQ PO TBCR
20.0000 meq | EXTENDED_RELEASE_TABLET | Freq: Every day | ORAL | Status: DC
  Administered 2018-11-06 – 2018-11-08 (×3): 20 meq via ORAL
  Filled 2018-11-06 (×3): qty 1

## 2018-11-06 MED ORDER — FUROSEMIDE 20 MG PO TABS: 40.0000 mg | ORAL_TABLET | Freq: Two times a day (BID) | ORAL | 1 refills | Status: DC

## 2018-11-06 MED ORDER — FUROSEMIDE 10 MG/ML IJ SOLN
40.0000 mg | Freq: Two times a day (BID) | INTRAMUSCULAR | Status: DC
  Administered 2018-11-06 – 2018-11-07 (×2): 40 mg via INTRAVENOUS
  Filled 2018-11-06 (×2): qty 4

## 2018-11-06 MED ORDER — MUSCLE RUB 10-15 % EX CREA
TOPICAL_CREAM | CUTANEOUS | Status: DC | PRN
  Administered 2018-11-06 – 2018-11-11 (×3): via TOPICAL
  Filled 2018-11-06: qty 85

## 2018-11-06 MED ORDER — POTASSIUM CHLORIDE CRYS ER 20 MEQ PO TBCR: 20.0000 meq | EXTENDED_RELEASE_TABLET | Freq: Every day | ORAL | 1 refills | Status: DC

## 2018-11-06 NOTE — Progress Notes (Signed)
Physical Therapy Treatment Patient Details Name: Karen Richard MRN: [id number] DOB: [birth year] Today's Date: 11/06/2018    History of Present Illness Patient is a 85 y/o female who presents from PCP office secondary to BLE edema and Left hand swelling. Admitted with CHF exacerbation. PMH includes breast ca s/p bil mastectomy, OA, questionable NASH, HLD.     PT Comments    Pt progressing towards goals and able to increase ambulation distance, however, continues to be limited secondary to fatigue. Required min A for gait tasks with HHA. Presenting with decreased awareness of deficits and continues to refuse DME despite max education about using DME to improve safety and decrease fall risk. Current recommendations appropriate and pt reports she has RW and cane at home. Will continue to follow acutely to maximize functional mobility independence and safety.    Follow Up Recommendations  Home health PT;Supervision for mobility/OOB     Equipment Recommendations  None recommended by PT    Recommendations for Other Services       Precautions / Restrictions Precautions Precautions: Fall Restrictions Weight Bearing Restrictions: No    Mobility  Bed Mobility               General bed mobility comments: Up on 3 in 1 upon entry.   Transfers Overall transfer level: Needs assistance Equipment used: None Transfers: Sit to/from Stand Sit to Stand: Min guard         General transfer comment: Min guard for steadying assist. Holding onto sink with UE when wiping in standing following toileting.   Ambulation/Gait Ambulation/Gait assistance: Min assist Gait Distance (Feet): 125 Feet Assistive device: 1 person hand held assist Gait Pattern/deviations: Step-through pattern;Decreased stride length;Wide base of support Gait velocity: decreased   General Gait Details: Slow, waddle type gait. Unsteady and required min A and HHA, plus was reacing out for rail in hallway with other UE.  Extensive education about using RW to improve safety, however, pt denying use. Educated that "furniture walking" increases risk for falls. DOE at 2/4.   Stairs             Wheelchair Mobility    Modified Rankin (Stroke Patients Only)       Balance Overall balance assessment: Needs assistance Sitting-balance support: Feet supported;No upper extremity supported Sitting balance-Leahy Scale: Fair     Standing balance support: During functional activity;Single extremity supported Standing balance-Leahy Scale: Poor Standing balance comment: Reliant on UE support for balance.                             Cognition Arousal/Alertness: Awake/alert Behavior During Therapy: WFL for tasks assessed/performed Overall Cognitive Status: Within Functional Limits for tasks assessed                                 General Comments: question safety awareness/awareness of deficits. Declines using any DME despite holding into things in room/hall      Exercises      General Comments General comments (skin integrity, edema, etc.): Pt's husband present during session.       Pertinent Vitals/Pain Pain Assessment: No/denies pain    Home Living                      Prior Function            PT Goals (current goals can now be  found in the care plan section) Acute Rehab PT Goals Patient Stated Goal: to return home and get some sleep PT Goal Formulation: With patient Time For Goal Achievement: 11/19/18 Potential to Achieve Goals: Good Progress towards PT goals: Progressing toward goals    Frequency    Min 3X/week      PT Plan Current plan remains appropriate    Co-evaluation              AM-PAC PT "6 Clicks" Mobility   Outcome Measure  Help needed turning from your back to your side while in a flat bed without using bedrails?: A Little Help needed moving from lying on your back to sitting on the side of a flat bed without using  bedrails?: A Little Help needed moving to and from a bed to a chair (including a wheelchair)?: A Little Help needed standing up from a chair using your arms (e.g., wheelchair or bedside chair)?: A Little Help needed to walk in hospital room?: A Little Help needed climbing 3-5 steps with a railing? : A Little 6 Click Score: 18    End of Session Equipment Utilized During Treatment: Gait belt Activity Tolerance: Patient limited by fatigue Patient left: in bed;with call bell/phone within reach;with bed alarm set;with nursing/sitter in room(sitting EOB with tech present ) Nurse Communication: Mobility status PT Visit Diagnosis: Difficulty in walking, not elsewhere classified (R26.2);Unsteadiness on feet (R26.81)     Time: 8159-4707 PT Time Calculation (min) (ACUTE ONLY): 21 min  Charges:  $Gait Training: 8-22 mins                     Leighton Ruff, PT, DPT  Acute Rehabilitation Services  Pager: [phone] Office: [phone]    [name] 11/06/2018, 12:11 PM

## 2018-11-06 NOTE — Progress Notes (Signed)
Progress note.   Discharge cancelled. Cardiology opined likely can diurese more based on weights.   Will cancel discharge and continue IV lasix BID    Santiago Glad black, np

## 2018-11-06 NOTE — Discharge Summary (Addendum)
Physician Discharge Summary  Karen Richard INO:[id number] DOB: 19-Oct-1927 DOA: 11/04/2018    Attending MD note  Patient was seen, examined,treatment plan was discussed with the  Advance Practice Provider.  I have personally reviewed the clinical findings, lab,EKG, imaging studies and management of this patient in detail.I have also reviewed the orders written for this patient which were under my direction. I agree with the documentation, as recorded by the Advance Practice Provider.   MCKENZE SLONE is a 85 y.o. female with a Past Medical History of diastolic CHF, anxiety, HLD; who presents with complaints of worsening bilateral edema.  Initial plan was to possibly discharge patient home with increased dose of Lasix to follow-up with primary care provider.  However, discharge was canceled after family expressing need for continued IV diuresis due to significant weight gain.   Norval Morton, MD Internal Medicine  pager [phone] [facility]    PCP: Caren Macadam, MD  Admit date: 11/04/2018 Discharge date: 11/06/2018  Time spent: 45 minutes  Recommendations for Outpatient Follow-up:  1. Follow up with PCP 1-2 weeks for evaluation of volume status/lasix dose, trending of chronically elevated LFT's, bmet for electrolyte check and need for work up for lymphedema  2. Continue to pursue orthopedic work up for chronic back pain 3. Weight daily and document and take data to PCP apt.  4. Low salt diet  5. Elevate LE when seated    Discharge Diagnoses:  Principal Problem:   Acute exacerbation of CHF (congestive heart failure) (HCC) Active Problems:   Edema of upper extremity   Intertrigo   Leukocytosis   Hypoalbuminemia   Elevated LFTs   Anxiety   Chronic back pain   Discharge Condition: stable  Diet recommendation: low salt heart healthy  Filed Weights   11/04/18 2224 11/05/18 0105 11/06/18 0110  Weight: 91 kg 90.9 kg 89.7 kg    History of present illness:   Karen Richard is an 85 y.o. female with medical history significant ofchronic diastolic congestive heart failure, anxiety, hyperlipidemia presented to the hospital on 11/04/18 from her PCPs office for evaluation of excessive edema of bilateral lower extremities and left upper extremity. Patient stated her legs have been swollen for the past few months.Edema has been getting progressively worse.  Hospital Course:   Acute exacerbation of chronic diastolic congestive heart failure -Presented with several months of dyspnea on exertion, weight gain, and progressively worse bilateral lower extremity edema. Lasix dose decreased approximately 2 months ago due to frequent urination at night. Echo reveals 60% EF with moderate pulmonary hypertension. Right lower extremity Doppler ordered by vascular surgery in December 2019negativefor DVT. Received IV lasix and diuresed well. Weight 89.7kg from 91kg on admission. Will discharge on lasix 34m po bid. Instructed to weigh daily. Added daily potassium Follow up with PCP 1-2 weeks  Left upper extremity edema Left upper extremity Doppler negative for DVT. Patient has hypoalbuminemia and hx of mastectomy. Follow up with PCP as noted  Hypoalbuminemia -Nutrition consult done. Recommended low salt and fluid restriction - ? Liver cirrhosis  Intertrigo of panniculus -Clotrimazole topically twice daily  Leukocytosis History of CLL.Followed by oncology (Dr. EMarin Olp. Last office visit in October 2019. Her chronic leukocytosis is thought to be secondary to CLL. White count 18.5, previously elevated as well. Patient is afebrile and nontoxic-appearing. Chest x-ray not suggestive of pneumonia. Patient does not endorse any UTI symptoms.  Elevated LFTs/bilirubin LFTs chronically elevated per chart review. She has a history of prior cholecystectomy. Abdominal ultrasound  done in August 2017 suggestive of liver cirrhosis. Outpatient follow-up -limit  tylenol  Anxiety. Stable at baseline  Chronic back pain. Husband reports in middle of workup with orthopedic  obesity Body mass index is 36.64 kg/m.   Procedures:    Consultations:    Discharge Exam: Vitals:   11/06/18 0334 11/06/18 0945  BP: (!) 152/67 (!) 139/55  Pulse: (!) 103 (!) 101  Resp: 18   Temp: 98.4 F (36.9 C)   SpO2: 95% 94%    General: sitting in chair in no acute distress Cardiovascular: RRR trace-1+ LE edema ?lymphedema Respiratory: normal effort respers slightly shallow good air movement. No wheeze no crackles  Discharge Instructions   Discharge Instructions    Call MD for:  persistant dizziness or light-headedness   Complete by:  As directed    Call MD for:  severe uncontrolled pain   Complete by:  As directed    Diet - low sodium heart healthy   Complete by:  As directed    Discharge instructions   Complete by:  As directed    1. Follow up with PCP 1-2 weeks for evaluation of volume status/lasix dose, trending of chronically elevated LFT's, bmet for electrolyte check and need for work up for lymphedema  2. Continue to pursue orthopedic work up for chronic back pain 3. Weight daily and document and take data to PCP apt.  4. Low salt diet  5. Elevate LE when seated    Increase activity slowly   Complete by:  As directed      Allergies as of 11/06/2018      Reactions   Codeine Nausea Only   Darvocet [propoxyphene N-acetaminophen] Nausea Only      Medication List    TAKE these medications   amoxicillin 500 MG tablet Commonly known as:  AMOXIL Take 2,000 mg by mouth See admin instructions. Take 2041m 1 hour before dental appointments   aspirin EC 81 MG EC tablet Generic drug:  aspirin Take 81 mg by mouth every morning.   furosemide 20 MG tablet Commonly known as:  LASIX Take 2 tablets (40 mg total) by mouth 2 (two) times daily. Start taking on:  November 07, 2018 What changed:    how much to take  when to take this    LORazepam 0.5 MG tablet Commonly known as:  ATIVAN TAKE 1 TABLET BY MOUTH EVERY DAY AS NEEDED FOR ANXIETY What changed:  See the new instructions.   potassium chloride SA 20 MEQ tablet Commonly known as:  K-DUR,KLOR-CON Take 1 tablet (20 mEq total) by mouth daily.   TYLENOL ARTHRITIS EXT RELIEF PO Take 1 tablet by mouth as needed (pain).   Vitamin D (Ergocalciferol) 1.25 MG (50000 UT) Caps capsule Commonly known as:  DRISDOL TAKE 1 CAPSULE ONCE A WEEK What changed:  See the new instructions.      Allergies  Allergen Reactions  . Codeine Nausea Only  . Darvocet [Propoxyphene N-Acetaminophen] Nausea Only   Follow-up Information    Home, [facility] Follow up.   Specialty:  Home Health Services Why:  Home Physical Therapy Contact information: [phone]        HCaren Macadam MD. Go on 11/15/2018.   Specialty:  Family Medicine Why:  @11 :45am Contact information: [phone]           The results of significant diagnostics from this hospitalization (including imaging, microbiology, ancillary  and laboratory) are listed below for reference.    Significant Diagnostic Studies: Dg Chest 2 View  Result Date: 11/04/2018 CLINICAL DATA:  Excessive lower extremity edema. EXAM: CHEST - 2 VIEW COMPARISON:  09/05/2017 FINDINGS: AP and lateral views of the chest show cardiomegaly with low volumes. There is pulmonary vascular congestion without overt pulmonary edema. Tiny bilateral pleural effusions noted posteriorly. Subsegmental atelectasis or scarring evident left midlung. The visualized bony structures of the thorax are intact. Telemetry leads overlie the chest. IMPRESSION: Cardiomegaly with vascular congestion and tiny bilateral pleural effusions. Electronically Signed   By: Misty Stanley M.D.   On: 11/04/2018 16:14   Mr Lumbar Spine Wo Contrast  Result Date: 11/02/2018 CLINICAL DATA:   The patient suffered a low back injury lifting her husband after he had had surgery approximately 2 months ago. Continued back pain. The patient also has right hip and lower extremity pain. Initial encounter. EXAM: MRI LUMBAR SPINE WITHOUT CONTRAST TECHNIQUE: Multiplanar, multisequence MR imaging of the lumbar spine was performed. No intravenous contrast was administered. COMPARISON:  Plain films lumbar spine 09/26/2017. FINDINGS: Segmentation:  Standard. Alignment:  0.3 cm anterolisthesis L3 on L4 is unchanged. Vertebrae: The patient has superior endplate compression fractures of T12, L1, L2 and L3. There is marrow edema in L1 and L3 consistent with acute or subacute injuries. Edema is more intense in L3. Vertebral body height loss at L1 is estimated at 15% centrally and approximately 50% centrally and anteriorly at L3. Remote T12 and L2 compression fractures demonstrate approximately 40% vertebral body height loss centrally at T12 and 50% vertebral body height loss anteriorly at L2. All of the patient's fractures have an appearance most consistent with senile osteoporotic injuries rather than pathologic fractures. Conus medullaris and cauda equina: Conus extends to the L1-2 level. Conus and cauda equina appear normal. Paraspinal and other soft tissues: Negative. Disc levels: The T11-12 is imaged in the sagittal plane only. There is mild bony retropulsion off the superior endplate of G86 and a shallow disc bulge. The ventral thecal sac appears effaced. The foramina appear open. T12-L1: Minimal retropulsion off the superior endplate of L1. No stenosis. L1-2: Mild-to-moderate facet degenerative change and ligamentum flavum thickening. There is a shallow disc bulge and some bony retropulsion off the superior endplate of L2. Moderate central canal stenosis and mild to moderate bilateral foraminal narrowing is seen. L2-3: There is a shallow right lateral recess and foraminal protrusion and mild bony retropulsion off the  superior endplate. Ligamentum flavum thickening is seen. There is moderate central canal stenosis and mild to moderate bilateral foraminal narrowing. L3-4: Shallow right paracentral protrusion extends into the foramen. There is mild narrowing in the right lateral recess and mild bilateral foraminal narrowing. L4-5: Shallow broad-based central protrusion causes mild narrowing in the left lateral recess. Mild bilateral foraminal narrowing is present. There is marked loss of disc space height. L5-S1: Loss of disc space height and a shallow broad-based left paracentral and lateral recess protrusion. There is mild narrowing in the left lateral recess and foramen. The right foramen is open. IMPRESSION: The examination is positive for acute or subacute compression fractures of L1 and L3 with marrow edema in the vertebral bodies, more intense in L3. Vertebral body height loss centrally at L1 is estimated at up to 15% and up to approximately 50% centrally and anteriorly in L3. The fractures have an appearance most consistent with senile osteoporotic injuries. Remote T12 and L2 compression fractures. Moderate central canal stenosis and mild to moderate  bilateral foraminal narrowing at L1-2 where there is a shallow disc bulge and mild bony retropulsion off the superior endplate of L2. Moderate central canal stenosis and mild to moderate bilateral foraminal narrowing at L2-3 where there is mild bony retropulsion off the superior endplate of L3 and a shallow right lateral recess and foraminal protrusion. Mild lateral recess and foraminal narrowing at L3-4, L4-5 and L5-S1 as described above. Electronically Signed   By: Inge Rise M.D.   On: 11/02/2018 09:56   Vas [place] Lower Extremity Venous Reflux  Result Date: 10/18/2018  Lower Venous Reflux Study Indications: Edema.  Performing Technologist: Ralene Cork RVT  Examination Guidelines: A complete evaluation includes B-mode imaging, spectral Doppler, color Doppler, and  power Doppler as needed of all accessible portions of each vessel. Bilateral testing is considered an integral part of a complete examination. Limited examinations for reoccurring indications may be performed as noted. The reflux portion of the exam is performed with the patient in reverse Trendelenburg.  Vein Diameters: +------------------------------+----------+---------+                               Right (cm)Left (cm) +------------------------------+----------+---------+ GSV at Saphenofemoral junction0.865               +------------------------------+----------+---------+ GSV at prox thigh             0.667               +------------------------------+----------+---------+ GSV at mid thigh              0.546               +------------------------------+----------+---------+ GSV at distal thigh           0.523               +------------------------------+----------+---------+ GSV at knee                   0.502               +------------------------------+----------+---------+ GSV prox calf                 0.37                +------------------------------+----------+---------+ SSV origin                    NV                  +------------------------------+----------+---------+ SSV prox                      NV                  +------------------------------+----------+---------+ SSV mid                       NV                  +------------------------------+----------+---------+   Summary: Right: There is no evidence of deep vein thrombosis in the lower extremity.There is no evidence of superficial venous thrombosis.There is no evidence of chronic venous insufficiency. Severe interstitial fluid in the right calf.  *See table(s) above for measurements and observations. Electronically signed by Servando Snare MD on 10/18/2018 at 2:42:57 PM.    Final    Ue Venous Duplex ([facility] Only)  Result Date: 11/05/2018 UPPER VENOUS STUDY  Indications: Edema  Performing  Technologist: Toma Copier RVS  Examination Guidelines: A complete evaluation includes B-mode imaging, spectral Doppler, color Doppler, and power Doppler as needed of all accessible portions of each vessel. Bilateral testing is considered an integral part of a complete examination. Limited examinations for reoccurring indications may be performed as noted.  Right Findings: +----------+------------+----------+---------+-----------+-------+ RIGHT     CompressiblePropertiesPhasicitySpontaneousSummary +----------+------------+----------+---------+-----------+-------+ Subclavian                         Yes       Yes            +----------+------------+----------+---------+-----------+-------+  Left Findings: +----------+------------+----------+---------+-----------+-------+ LEFT      CompressiblePropertiesPhasicitySpontaneousSummary +----------+------------+----------+---------+-----------+-------+ IJV           Full                 Yes       Yes            +----------+------------+----------+---------+-----------+-------+ Subclavian                         Yes       Yes            +----------+------------+----------+---------+-----------+-------+ Axillary      Full                 Yes       Yes            +----------+------------+----------+---------+-----------+-------+ Brachial      Full                 Yes       Yes            +----------+------------+----------+---------+-----------+-------+ Radial        Full                                          +----------+------------+----------+---------+-----------+-------+ Ulnar         Full                                          +----------+------------+----------+---------+-----------+-------+ Cephalic      Full                                          +----------+------------+----------+---------+-----------+-------+ Basilic       Full                 Yes       Yes             +----------+------------+----------+---------+-----------+-------+  Summary:  Right: No evidence of thrombosis in the subclavian.  Left: No evidence of deep vein thrombosis in the upper extremity. No evidence of superficial vein thrombosis in the upper extremity. No evidence of thrombosis in the subclavian.  *See table(s) above for measurements and observations.  Diagnosing physician: Sherren Mocha MD Electronically signed by Sherren Mocha MD on 11/05/2018 at 8:09:37 PM.    Final     Microbiology: No results found for this or any previous visit (from the past 240 hour(s)).   Labs: Basic Metabolic Panel: Recent Labs  Lab 11/04/18 1550 11/05/18 0522 11/06/18 0809  NA 135 139 137  K 4.1 4.5 3.9  CL 103 105 100  CO2 26 30 30   GLUCOSE 134* 115* 103*  BUN 12 14 16   CREATININE 0.82 0.85 0.82  CALCIUM 8.5* 8.4* 8.4*   Liver Function Tests: Recent Labs  Lab 11/04/18 1550 11/06/18 0809  AST 102* 98*  ALT 36 34  ALKPHOS 229* 199*  BILITOT 4.0* 4.1*  PROT 5.1* 5.0*  ALBUMIN 2.1* 2.0*   No results for input(s): LIPASE, AMYLASE in the last 168 hours. No results for input(s): AMMONIA in the last 168 hours. CBC: Recent Labs  Lab 11/04/18 1550 11/05/18 0522 11/06/18 0809  WBC 18.5* 14.0* 18.2*  NEUTROABS 12.4*  --   --   HGB 11.7* 11.0* 11.4*  HCT 35.1* 31.3* 33.4*  MCV 99.4 98.7 97.1  PLT 115* 106* 128*   Cardiac Enzymes: No results for input(s): CKTOTAL, CKMB, CKMBINDEX, TROPONINI in the last 168 hours. BNP: BNP (last 3 results) Recent Labs    11/04/18 1520  BNP 97.5    ProBNP (last 3 results) No results for input(s): PROBNP in the last 8760 hours.  CBG: No results for input(s): GLUCAP in the last 168 hours.     SignedRadene Gunning NP [facility] 11/06/2018, 11:58 AM

## 2018-11-07 DIAGNOSIS — F419 Anxiety disorder, unspecified: Secondary | ICD-10-CM | POA: Diagnosis not present

## 2018-11-07 DIAGNOSIS — E873 Alkalosis: Secondary | ICD-10-CM | POA: Diagnosis not present

## 2018-11-07 DIAGNOSIS — I509 Heart failure, unspecified: Secondary | ICD-10-CM

## 2018-11-07 DIAGNOSIS — E8809 Other disorders of plasma-protein metabolism, not elsewhere classified: Secondary | ICD-10-CM | POA: Diagnosis present

## 2018-11-07 DIAGNOSIS — R410 Disorientation, unspecified: Secondary | ICD-10-CM | POA: Diagnosis not present

## 2018-11-07 DIAGNOSIS — D638 Anemia in other chronic diseases classified elsewhere: Secondary | ICD-10-CM | POA: Diagnosis present

## 2018-11-07 DIAGNOSIS — K7581 Nonalcoholic steatohepatitis (NASH): Secondary | ICD-10-CM | POA: Diagnosis present

## 2018-11-07 DIAGNOSIS — L570 Actinic keratosis: Secondary | ICD-10-CM | POA: Diagnosis present

## 2018-11-07 DIAGNOSIS — Z66 Do not resuscitate: Secondary | ICD-10-CM | POA: Diagnosis present

## 2018-11-07 DIAGNOSIS — E785 Hyperlipidemia, unspecified: Secondary | ICD-10-CM | POA: Diagnosis present

## 2018-11-07 DIAGNOSIS — K7460 Unspecified cirrhosis of liver: Secondary | ICD-10-CM | POA: Diagnosis not present

## 2018-11-07 DIAGNOSIS — R600 Localized edema: Secondary | ICD-10-CM | POA: Diagnosis not present

## 2018-11-07 DIAGNOSIS — E876 Hypokalemia: Secondary | ICD-10-CM | POA: Diagnosis present

## 2018-11-07 DIAGNOSIS — I872 Venous insufficiency (chronic) (peripheral): Secondary | ICD-10-CM | POA: Diagnosis present

## 2018-11-07 DIAGNOSIS — L304 Erythema intertrigo: Secondary | ICD-10-CM | POA: Diagnosis present

## 2018-11-07 DIAGNOSIS — R188 Other ascites: Secondary | ICD-10-CM | POA: Diagnosis not present

## 2018-11-07 DIAGNOSIS — M549 Dorsalgia, unspecified: Secondary | ICD-10-CM | POA: Diagnosis not present

## 2018-11-07 DIAGNOSIS — R0602 Shortness of breath: Secondary | ICD-10-CM | POA: Diagnosis present

## 2018-11-07 DIAGNOSIS — I272 Pulmonary hypertension, unspecified: Secondary | ICD-10-CM | POA: Diagnosis present

## 2018-11-07 DIAGNOSIS — D696 Thrombocytopenia, unspecified: Secondary | ICD-10-CM | POA: Diagnosis present

## 2018-11-07 DIAGNOSIS — G8929 Other chronic pain: Secondary | ICD-10-CM | POA: Diagnosis present

## 2018-11-07 DIAGNOSIS — E669 Obesity, unspecified: Secondary | ICD-10-CM | POA: Diagnosis present

## 2018-11-07 DIAGNOSIS — I5033 Acute on chronic diastolic (congestive) heart failure: Secondary | ICD-10-CM | POA: Diagnosis not present

## 2018-11-07 DIAGNOSIS — B372 Candidiasis of skin and nail: Secondary | ICD-10-CM | POA: Diagnosis present

## 2018-11-07 DIAGNOSIS — C9110 Chronic lymphocytic leukemia of B-cell type not having achieved remission: Secondary | ICD-10-CM | POA: Diagnosis present

## 2018-11-07 DIAGNOSIS — I110 Hypertensive heart disease with heart failure: Secondary | ICD-10-CM | POA: Diagnosis present

## 2018-11-07 DIAGNOSIS — G2581 Restless legs syndrome: Secondary | ICD-10-CM | POA: Diagnosis present

## 2018-11-07 LAB — BASIC METABOLIC PANEL
Anion gap: 6 (ref 5–15)
BUN: 19 mg/dL (ref 8–23)
CO2: 30 mmol/L (ref 22–32)
Calcium: 8 mg/dL — ABNORMAL LOW (ref 8.9–10.3)
Chloride: 100 mmol/L (ref 98–111)
Creatinine, Ser: 0.93 mg/dL (ref 0.44–1.00)
GFR calc Af Amer: >60 mL/min (ref >60)
GFR calc non Af Amer: 54 mL/min — ABNORMAL LOW (ref >60)
Glucose, Bld: 112 mg/dL — ABNORMAL HIGH (ref 70–99)
Potassium: 3.8 mmol/L (ref 3.5–5.1)
Sodium: 136 mmol/L (ref 135–145)

## 2018-11-07 MED ORDER — BOOST / RESOURCE BREEZE PO LIQD CUSTOM
1.0000 | Freq: Three times a day (TID) | ORAL | Status: DC
  Administered 2018-11-07 – 2018-11-09 (×5): 1 via ORAL

## 2018-11-07 NOTE — Progress Notes (Signed)
Physical Therapy Treatment Patient Details Name: Karen Richard MRN: [id number] DOB: Dec 06, 1926 Today's Date: 11/07/2018    History of Present Illness Patient is a 85 y/o female who presents from PCP office secondary to BLE edema and Left hand swelling. Admitted with CHF exacerbation. PMH includes breast ca s/p bil mastectomy, OA, questionable NASH, HLD.     PT Comments    Pt limited in gait distance secondary to fatigue this session. Continues to be unsteady without use of DME, and requires min A. DOE at 2/4 and oxygen sats at 91% on RA. Educated and had pt perform LE HEP this session. Current recommendations appropriate. Will continue to follow acutely to maximize functional mobility independence and safety.    Follow Up Recommendations  Home health PT;Supervision for mobility/OOB     Equipment Recommendations  None recommended by PT    Recommendations for Other Services       Precautions / Restrictions Precautions Precautions: Fall Restrictions Weight Bearing Restrictions: No    Mobility  Bed Mobility Overal bed mobility: Needs Assistance Bed Mobility: Supine to Sit;Sit to Supine     Supine to sit: Supervision Sit to supine: Supervision   General bed mobility comments: Used rail to come to EOB. Increased time required to perform bed mobility.   Transfers Overall transfer level: Needs assistance Equipment used: None Transfers: Sit to/from [facility] Sit to Stand: Min assist Stand pivot transfers: Min assist       General transfer comment: Min A for steadying assist. Increased time and effort coming to standing and transferring to [facility].   Ambulation/Gait Ambulation/Gait assistance: Min assist Gait Distance (Feet): 100 Feet Assistive device: 1 person hand held assist Gait Pattern/deviations: Step-through pattern;Decreased stride length;Wide base of support Gait velocity: decreased   General Gait Details: Slow, waddle type gait. Pt with  increased fatigue, which limited gait distance. Unsteady and required min A for steadying with HHA. Oxygen at 91% and DOE at 2/4.    Stairs             Wheelchair Mobility    Modified Rankin (Stroke Patients Only)       Balance Overall balance assessment: Needs assistance Sitting-balance support: Feet supported;No upper extremity supported Sitting balance-Leahy Scale: Fair     Standing balance support: Single extremity supported;During functional activity Standing balance-Leahy Scale: Poor Standing balance comment: Reliant on UE support for balance.                             Cognition Arousal/Alertness: Awake/alert Behavior During Therapy: WFL for tasks assessed/performed Overall Cognitive Status: Within Functional Limits for tasks assessed                                 General Comments: question safety awareness/awareness of deficits. Declines using any DME despite holding into things in room/hall      Exercises General Exercises - Lower Extremity Ankle Circles/Pumps: AROM;Both;10 reps;Supine Quad Sets: AROM;Both;5 reps;Supine Long Arc Quad: AROM;Both;10 reps;Seated(Reliant on BUE support to maintain sitting balance) Heel Slides: AROM;Both;5 reps;Supine    General Comments General comments (skin integrity, edema, etc.): Pt's husband present during session.       Pertinent Vitals/Pain Pain Assessment: No/denies pain    Home Living                      Prior Function  PT Goals (current goals can now be found in the care plan section) Acute Rehab PT Goals Patient Stated Goal: to get the fluid off of her  PT Goal Formulation: With patient Time For Goal Achievement: 11/19/18 Potential to Achieve Goals: Good Progress towards PT goals: Progressing toward goals    Frequency    Min 3X/week      PT Plan Current plan remains appropriate    Co-evaluation              AM-PAC PT "6 Clicks" Mobility    Outcome Measure  Help needed turning from your back to your side while in a flat bed without using bedrails?: A Little Help needed moving from lying on your back to sitting on the side of a flat bed without using bedrails?: A Little Help needed moving to and from a bed to a chair (including a wheelchair)?: A Little Help needed standing up from a chair using your arms (e.g., wheelchair or bedside chair)?: A Little Help needed to walk in hospital room?: A Little Help needed climbing 3-5 steps with a railing? : A Little 6 Click Score: 18    End of Session Equipment Utilized During Treatment: Gait belt Activity Tolerance: Patient limited by fatigue Patient left: in bed;with call bell/phone within reach Nurse Communication: Mobility status PT Visit Diagnosis: Difficulty in walking, not elsewhere classified (R26.2);Unsteadiness on feet (R26.81)     Time: 0865-7846 PT Time Calculation (min) (ACUTE ONLY): 20 min  Charges:  $Therapeutic Exercise: 8-22 mins                     Leighton Ruff, PT, DPT  Acute Rehabilitation Services  Pager: [phone] Office: [phone]    [name] 11/07/2018, 12:41 PM

## 2018-11-07 NOTE — Plan of Care (Signed)
  Problem: Education: Goal: Ability to demonstrate management of disease process will improve Outcome: Progressing Goal: Ability to verbalize understanding of medication therapies will improve Outcome: Progressing Goal: Individualized Educational Video(s) Outcome: Progressing   Problem: Cardiac: Goal: Ability to achieve and maintain adequate cardiopulmonary perfusion will improve Outcome: Progressing   Problem: Education: Goal: Knowledge of General Education information will improve Description Including pain rating scale, medication(s)/side effects and non-pharmacologic comfort measures Outcome: Progressing   Problem: Health Behavior/Discharge Planning: Goal: Ability to manage health-related needs will improve Outcome: Progressing   Problem: Clinical Measurements: Goal: Ability to maintain clinical measurements within normal limits will improve Outcome: Progressing Goal: Will remain free from infection Outcome: Progressing Goal: Diagnostic test results will improve Outcome: Progressing Goal: Respiratory complications will improve Outcome: Progressing Goal: Cardiovascular complication will be avoided Outcome: Progressing   Problem: Activity: Goal: Risk for activity intolerance will decrease Outcome: Progressing   Problem: Nutrition: Goal: Adequate nutrition will be maintained Outcome: Progressing   Problem: Coping: Goal: Level of anxiety will decrease Outcome: Progressing   Problem: Elimination: Goal: Will not experience complications related to bowel motility Outcome: Progressing Goal: Will not experience complications related to urinary retention Outcome: Progressing   Problem: Pain Managment: Goal: General experience of comfort will improve Outcome: Progressing   Problem: Safety: Goal: Ability to remain free from injury will improve Outcome: Progressing   Problem: Skin Integrity: Goal: Risk for impaired skin integrity will decrease Outcome:  Progressing

## 2018-11-07 NOTE — Plan of Care (Signed)
  Problem: Education: Goal: Ability to demonstrate management of disease process will improve Outcome: Adequate for Discharge   Problem: Education: Goal: Knowledge of General Education information will improve Description Including pain rating scale, medication(s)/side effects and non-pharmacologic comfort measures Outcome: Adequate for Discharge

## 2018-11-07 NOTE — Progress Notes (Signed)
[facility] PROGRESS NOTE  Karen Richard QMG:[id number] DOB: 21-Jul-1927 DOA: 11/04/2018 PCP: Caren Macadam, MD  Assessment/Plan:  Acute exacerbation of chronic diastolic congestive heart failure -Presented with several months of dyspnea on exertion, weight gain, and progressively worse bilateral lower extremity edema. Lasix dose decreased approximately 2 months ago due to frequent urination at night. Echo reveals 60% EF with moderate pulmonary hypertension. Right lower extremity Doppler ordered by vascular surgery in December 2019negativefor DVT. Getting IV lasix and volume status -2.5L. however weight not reflective. ?accuracy of weight. on exam LE edema less and left upper arm edema less. Weight 89.9kg from 91kg on admission.  -continue IV lasix -daily weights -intake and outputs -monitor electrolytes and kidney function  Left upper extremity edema Left upper extremity Doppler negative for DVT. Patient has hypoalbuminemia and hx of mastectomy. Improving this am. May benefit OP follow up  Hypoalbuminemia -Nutrition consult noted. Recommended low salt and fluid restriction   Intertrigo of panniculus -Clotrimazole topically twice daily  Leukocytosis History of CLL.Followed by oncology (Dr. Marin Olp). Last office visit in October 2019. Her chronic leukocytosis is thought to be secondary to CLL. White count 18.5, previously elevated as well. Patient is afebrile and nontoxic-appearing. Chest x-ray not suggestive of pneumonia. Patient does not endorse any UTI symptoms.  Elevated LFTs/bilirubin LFTs chronically elevated per chart review. She has a history of prior cholecystectomy. Abdominal ultrasound done in August 2017 suggestive of liver cirrhosis. Outpatient follow-up -limit tylenol  Anxiety. Stable at baseline  Chronic back pain. Husband reports in middle of workup with orthopedic  obesity Body mass index is 36.64 kg/m.   Code Status: dnr Family  Communication:  None present Disposition Plan: home hopefully 24-36 hours   Consultants:  Curbside cards  Procedures:    Antibiotics:    HPI/Subjective: Karen Richard is a 85 y.o. female with a Past Medical History of diastolic CHF, anxiety, HLD; who presented with complaints of worsening bilateral edema.  Initial plan was to possibly discharge patient home with increased dose of Lasix to follow-up with primary care provider.  However, discharge was canceled after family expressing need for continued IV diuresis due to significant weight gain and curbside recommendations from cardiology  Reports feeling better this am. Left arm edema less. No compliants   Objective: Vitals:   11/07/18 0537 11/07/18 0900  BP: (!) 154/47 (!) 137/52  Pulse: 93   Resp: 20   Temp: 98.3 F (36.8 C)   SpO2: 92%     Intake/Output Summary (Last 24 hours) at 11/07/2018 0943 Last data filed at 11/07/2018 0915 Gross per 24 hour  Intake 1800 ml  Output 1876 ml  Net -76 ml   Filed Weights   11/05/18 0105 11/06/18 0110 11/07/18 0250  Weight: 90.9 kg 89.7 kg 89.9 kg    Exam:   General:  Awake alert no acute distress  Cardiovascular: rrr no mgr 1-2+ LE edema, LE not as tight  Respiratory: normal effort BS with good air flow fine crackles bilateral base no wheeze  Abdomen: obese soft +BS non-tender no guarding   Musculoskeletal: joints without swelling/erythema   Data Reviewed: Basic Metabolic Panel: Recent Labs  Lab 11/04/18 1550 11/05/18 0522 11/06/18 0809 11/07/18 0417  NA 135 139 137 136  K 4.1 4.5 3.9 3.8  CL 103 105 100 100  CO2 26 30 30 30   GLUCOSE 134* 115* 103* 112*  BUN 12 14 16 19   CREATININE 0.82 0.85 0.82 0.93  CALCIUM 8.5* 8.4* 8.4* 8.0*  Liver Function Tests: Recent Labs  Lab 11/04/18 1550 11/06/18 0809  AST 102* 98*  ALT 36 34  ALKPHOS 229* 199*  BILITOT 4.0* 4.1*  PROT 5.1* 5.0*  ALBUMIN 2.1* 2.0*   No results for input(s): LIPASE, AMYLASE in the  last 168 hours. No results for input(s): AMMONIA in the last 168 hours. CBC: Recent Labs  Lab 11/04/18 1550 11/05/18 0522 11/06/18 0809  WBC 18.5* 14.0* 18.2*  NEUTROABS 12.4*  --   --   HGB 11.7* 11.0* 11.4*  HCT 35.1* 31.3* 33.4*  MCV 99.4 98.7 97.1  PLT 115* 106* 128*   Cardiac Enzymes: No results for input(s): CKTOTAL, CKMB, CKMBINDEX, TROPONINI in the last 168 hours. BNP (last 3 results) Recent Labs    11/04/18 1520  BNP 97.5    ProBNP (last 3 results) No results for input(s): PROBNP in the last 8760 hours.  CBG: No results for input(s): GLUCAP in the last 168 hours.  No results found for this or any previous visit (from the past 240 hour(s)).   Studies: No results found.  Scheduled Meds: . aspirin EC  81 mg Oral q morning - 10a  . clotrimazole   Topical BID  . dextromethorphan-guaiFENesin  1 tablet Oral BID  . enoxaparin (LOVENOX) injection  40 mg Subcutaneous Q24H  . feeding supplement  1 Container Oral TID BM  . furosemide  40 mg Intravenous BID  . potassium chloride  20 mEq Oral Daily   Continuous Infusions:  Principal Problem:   Acute exacerbation of CHF (congestive heart failure) (HCC) Active Problems:   Edema of upper extremity   Intertrigo   Leukocytosis   Hypoalbuminemia   Elevated LFTs   Anxiety   Chronic back pain    Time spent: 45 minutes    [place] NP [facility]  If 7PM-7AM, please contact night-coverage at [email or web address], password [facility] 11/07/2018, 9:43 AM  LOS: 0 days

## 2018-11-08 DIAGNOSIS — K7460 Unspecified cirrhosis of liver: Secondary | ICD-10-CM

## 2018-11-08 DIAGNOSIS — I5033 Acute on chronic diastolic (congestive) heart failure: Secondary | ICD-10-CM

## 2018-11-08 LAB — BASIC METABOLIC PANEL
ANION GAP: 6 (ref 5–15)
BUN: 17 mg/dL (ref 8–23)
CO2: 30 mmol/L (ref 22–32)
Calcium: 8 mg/dL — ABNORMAL LOW (ref 8.9–10.3)
Chloride: 99 mmol/L (ref 98–111)
Creatinine, Ser: 0.78 mg/dL (ref 0.44–1.00)
GFR calc Af Amer: >60 mL/min (ref >60)
GFR calc non Af Amer: >60 mL/min (ref >60)
Glucose, Bld: 102 mg/dL — ABNORMAL HIGH (ref 70–99)
Potassium: 3.9 mmol/L (ref 3.5–5.1)
Sodium: 135 mmol/L (ref 135–145)

## 2018-11-08 MED ORDER — METOLAZONE 2.5 MG PO TABS
2.5000 mg | ORAL_TABLET | Freq: Once | ORAL | Status: AC
  Administered 2018-11-08: 2.5 mg via ORAL
  Filled 2018-11-08: qty 1

## 2018-11-08 MED ORDER — POTASSIUM CHLORIDE CRYS ER 20 MEQ PO TBCR
40.0000 meq | EXTENDED_RELEASE_TABLET | Freq: Once | ORAL | Status: AC
  Administered 2018-11-08: 40 meq via ORAL
  Filled 2018-11-08: qty 2

## 2018-11-08 MED ORDER — FUROSEMIDE 10 MG/ML IJ SOLN
80.0000 mg | Freq: Two times a day (BID) | INTRAMUSCULAR | Status: DC
  Administered 2018-11-08 – 2018-11-11 (×7): 80 mg via INTRAVENOUS
  Filled 2018-11-08 (×8): qty 8

## 2018-11-08 NOTE — Progress Notes (Signed)
Orthopedic Tech Progress Note Patient Details:  Karen Richard 04-12-1927 [id number]  Ortho Devices Type of Ortho Device: Haematologist Ortho Device/Splint Location: bi-latetral Ortho Device/Splint Interventions: Ordered, Application, Adjustment   Post Interventions Patient Tolerated: Well Instructions Provided: Care of device, Adjustment of device   Karolee Stamps 11/08/2018, 6:07 PM

## 2018-11-08 NOTE — Consult Note (Addendum)
Advanced Heart Failure Team Consult Note   Primary Physician: Caren Macadam, MD PCP-Cardiologist:  No primary care provider on file.  Reason for Consultation: Acute on chronic diastolic CHF  HPI:    Karen Richard is seen today for evaluation of acute on chronic diastolic CHF at the request of Dr. Bonner Puna and family request.   Karen Richard is a 85 y.o. female with h/o chronic diastolic CHF, anxiety, HTN, Cirrhosis, and HLD.    Sent to [facility] 11/04/2018 from PCP office in setting of excessive BLE edema and LUE. This has gradually worsened over "months". She reported assessment by vascular surgeon with no clots noted (see below). She has gained about 25 lbs in the past 1.5 months by husband report with worsening DOE. Reports no orthopnea, PND, or cough.    Panniculitis noted with foul smell, and 3-4+ BLE edema with chronic venous stasis dermatitis. Pertinent labs on admission include Cr 0.82, K 4.1, BNP 97.5, WBC 18.5, or Hgb 11.7.  UA clear with only trace leukocytes and rare bacteria.  CXR with vascular congestion and small bilateral pleural effusions.   Reported taking her lasix 40 mg daily as ordered at home.   Negative 2.3L and weight only down 2 lbs from admission. Recent weights showed 175 lbs on 10/18/2018 and 176 lbs 08/07/2018.  She was received 5 doses of 40 mg IV lasix BID. Last dose yesterday am.   She is feeling about the same today. Denies orthopnea. Is lying flat in bed with legs elevated. Her husband says she remains SOB with exertion, but she says she feels pretty OK. She reports good UOP on home lasix PTA, and IV lasix this admission. Was drinking 2+ bottles of water and cups of juice at home regularly. She denies med non-compliance. Tries to watch salt. Her husband states her weight gain has been over about 2-3 months. She remains very swollen in her legs up into her thighs, with a swollen abdomen and edema into her LUE as well.   Echo 11/05/2018 LVEF 60-65%, Trivial MR,  Mild TR, PA peak pressure 48 mm Hg, and increased RVSP   Review of Systems: [y] = yes, [ ]  = no   General: Weight gain [y]; Weight loss [ ] ; Anorexia [ ] ; Fatigue [y]; Fever [ ] ; Chills [ ] ; Weakness [ ]   Cardiac: Chest pain/pressure [ ] ; Resting SOB [ ] ; Exertional SOB [y]; Orthopnea [ ] ; Pedal Edema [y]; Palpitations [ ] ; Syncope [ ] ; Presyncope [ ] ; Paroxysmal nocturnal dyspnea[ ]   Pulmonary: Cough [y]; Wheezing[ ] ; Hemoptysis[ ] ; Sputum [ ] ; Snoring [ ]   GI: Vomiting[ ] ; Dysphagia[ ] ; Melena[ ] ; Hematochezia [ ] ; Heartburn[ ] ; Abdominal pain [ ] ; Constipation [ ] ; Diarrhea [ ] ; BRBPR [ ]   GU: Hematuria[ ] ; Dysuria [ ] ; Nocturia[ ]   Vascular: Pain in legs with walking [ ] ; Pain in feet with lying flat [ ] ; Non-healing sores [ ] ; Stroke [ ] ; TIA [ ] ; Slurred speech [ ] ;  Neuro: Headaches[ ] ; Vertigo[ ] ; Seizures[ ] ; Paresthesias[ ] ;Blurred vision [ ] ; Diplopia [ ] ; Vision changes [ ]   Ortho/Skin: Arthritis [y]; Joint pain [y]; Muscle pain [ ] ; Joint swelling [ ] ; Back Pain [ ] ; Rash [y]  Psych: Depression[ ] ; Anxiety[ ]   Heme: Bleeding problems [ ] ; Clotting disorders [ ] ; Anemia [ ]   Endocrine: Diabetes [ ] ; Thyroid dysfunction[ ]   Home Medications Prior to Admission medications   Medication Sig Start Date End Date Taking? Authorizing Provider  Acetaminophen (TYLENOL ARTHRITIS EXT RELIEF PO) Take 1 tablet by mouth as needed (pain).    Yes [provider]  amoxicillin (AMOXIL) 500 MG tablet Take 2,000 mg by mouth See admin instructions. Take 2080m 1 hour before dental appointments   Yes [provider]  aspirin (ASPIRIN EC) 81 MG EC tablet Take 81 mg by mouth every morning.    Yes [provider]  LORazepam (ATIVAN) 0.5 MG tablet TAKE 1 TABLET BY MOUTH EVERY DAY AS NEEDED FOR ANXIETY Patient taking differently: Take 0.5 mg by mouth daily as needed for anxiety.  01/28/14  Yes Ennever, PRudell Cobb MD  Vitamin D, Ergocalciferol, (DRISDOL) 1.25 MG (50000 UT) CAPS capsule  TAKE 1 CAPSULE ONCE A WEEK Patient taking differently: Take 50,000 Units by mouth every 7 (seven) days.  11/04/18  Yes Ennever, PRudell Cobb MD  furosemide (LASIX) 20 MG tablet Take 2 tablets (40 mg total) by mouth 2 (two) times daily. 11/07/18   Black, KLezlie Octave NP  potassium chloride SA (K-DUR,KLOR-CON) 20 MEQ tablet Take 1 tablet (20 mEq total) by mouth daily. 11/06/18   Black, KLezlie Octave NP    Past Medical History: Past Medical History:  Diagnosis Date  . Anxiety   . Arthritis   . Cancer ([facility]   . Cataract   . Hyperlipidemia   . Neuromuscular disorder ([place]   . Swelling in chest    rigth breast where mastectomy was performed     Past Surgical History: Past Surgical History:  Procedure Laterality Date  . ABDOMINAL HYSTERECTOMY    . APPENDECTOMY    . BREAST SURGERY    . CHOLECYSTECTOMY    . EYE SURGERY    . JOINT REPLACEMENT      Family History: Family History  Problem Relation Age of Onset  . Diabetes Mother   . Heart attack Mother   . Diabetes Father   . Heart attack Father     Social History: Social History   Socioeconomic History  . Marital status: Married    Spouse name: Not on file  . Number of children: Not on file  . Years of education: Not on file  . Highest education level: Not on file  Occupational History  . Not on file  Social Needs  . Financial resource strain: Not on file  . Food insecurity:    Worry: Not on file    Inability: Not on file  . Transportation needs:    Medical: Not on file    Non-medical: Not on file  Tobacco Use  . Smoking status: Never Smoker  . Smokeless tobacco: Never Used  . Tobacco comment: never used tobacco  Substance and Sexual Activity  . Alcohol use: No    Alcohol/week: 0.0 standard drinks  . Drug use: No  . Sexual activity: Not Currently  Lifestyle  . Physical activity:    Days per week: Not on file    Minutes per session: Not on file  . Stress: Not on file  Relationships  . Social connections:    Talks on phone:  Not on file    Gets together: Not on file    Attends religious service: Not on file    Active member of club or organization: Not on file    Attends meetings of clubs or organizations: Not on file    Relationship status: Not on file  Other Topics Concern  . Not on file  Social History Narrative  . Not on file   Allergies:  Allergies  Allergen Reactions  . Codeine Nausea Only  . Darvocet [Propoxyphene N-Acetaminophen] Nausea Only    Objective:    Vital Signs:   Temp:  [98.1 F (36.7 C)-98.7 F (37.1 C)] 98.7 F (37.1 C) (01/10 0626) Pulse Rate:  [91-96] 96 (01/10 1106) Resp:  [18-21] 21 (01/10 0626) BP: (122-145)/(48-49) 122/49 (01/10 1106) SpO2:  [92 %-95 %] 92 % (01/10 1106) Weight:  [89.9 kg] 89.9 kg (01/10 0259) Last BM Date: 11/07/18  Weight change: Filed Weights   11/06/18 0110 11/07/18 0250 11/08/18 0259  Weight: 89.7 kg 89.9 kg 89.9 kg    Intake/Output:   Intake/Output Summary (Last 24 hours) at 11/08/2018 1341 Last data filed at 11/08/2018 1100 Gross per 24 hour  Intake 600 ml  Output 301 ml  Net 299 ml      Physical Exam    General:  Elderly appearing. NAD.  HEENT: normal Neck: supple. JVP to jaw. Carotids 2+ bilat; no bruits. No lymphadenopathy or thyromegaly appreciated. Cor: PMI nondisplaced. Regular rate & rhythm. Mild TR.  Lungs: Clear anteriorly.  Abdomen: Obese, distended, No hepatosplenomegaly. No bruits or masses. Good bowel sounds. Extremities: no cyanosis, clubbing or rash. 2-3+ soft edema into thighs.  Neuro: alert & orientedx3, cranial nerves grossly intact. moves all 4 extremities w/o difficulty. Affect pleasant   Telemetry   NSR 90s, personally reviewed.   EKG    NSR 96 bpm, LBBB QRS 141 msec, personally reviewed.   Labs   Basic Metabolic Panel: Recent Labs  Lab 11/04/18 1550 11/05/18 0522 11/06/18 0809 11/07/18 0417 11/08/18 0538  NA 135 139 137 136 135  K 4.1 4.5 3.9 3.8 3.9  CL 103 105 100 100 99  CO2 26 30 30  30 30   GLUCOSE 134* 115* 103* 112* 102*  BUN 12 14 16 19 17   CREATININE 0.82 0.85 0.82 0.93 0.78  CALCIUM 8.5* 8.4* 8.4* 8.0* 8.0*    Liver Function Tests: Recent Labs  Lab 11/04/18 1550 11/06/18 0809  AST 102* 98*  ALT 36 34  ALKPHOS 229* 199*  BILITOT 4.0* 4.1*  PROT 5.1* 5.0*  ALBUMIN 2.1* 2.0*   No results for input(s): LIPASE, AMYLASE in the last 168 hours. No results for input(s): AMMONIA in the last 168 hours.  CBC: Recent Labs  Lab 11/04/18 1550 11/05/18 0522 11/06/18 0809  WBC 18.5* 14.0* 18.2*  NEUTROABS 12.4*  --   --   HGB 11.7* 11.0* 11.4*  HCT 35.1* 31.3* 33.4*  MCV 99.4 98.7 97.1  PLT 115* 106* 128*   Cardiac Enzymes: No results for input(s): CKTOTAL, CKMB, CKMBINDEX, TROPONINI in the last 168 hours.  BNP: BNP (last 3 results) Recent Labs    11/04/18 1520  BNP 97.5   ProBNP (last 3 results) No results for input(s): PROBNP in the last 8760 hours.  CBG: No results for input(s): GLUCAP in the last 168 hours.  Coagulation Studies: No results for input(s): LABPROT, INR in the last 72 hours.  Imaging    No results found.  Medications:    Current Medications: . aspirin EC  81 mg Oral q morning - 10a  . clotrimazole   Topical BID  . dextromethorphan-guaiFENesin  1 tablet Oral BID  . enoxaparin (LOVENOX) injection  40 mg Subcutaneous Q24H  . feeding supplement  1 Container Oral TID BM  . potassium chloride  20 mEq Oral Daily     Infusions:   Patient Profile   Karen Richard is a 85 y.o. female with  h/o chronic diastolic CHF, anxiety, and HLD.    Sent to [facility] 11/04/2018 with significant BLE edema and worsening DOE. CHF team consulted for further diuresis.   Assessment/Plan   1. Acute on chronic diastolic CHF - Echo 11/02/7090 LVEF 60-65%, Trivial MR, Mild TR, PA peak pressure 48 mm Hg, and increased RVSP  - Volume status elevated on exam, more peripherally than centrally.  - Will restart IV lasix at 80 mg BID and a dose of  metolazone and follow. May need lasix gtt. She has 20+ lbs of fluid remaining. Follow kidney function closely.  - Wrap LEs with unna boots.  - [place] VAS 11/04/2018 negative for LUE DVT - US VAS 10/18/18 negative for RLE DVT. Significant interstitial fluid noted.   2. Intertrigo of panniculus - On clotrimazole per primary. Improving.  - WBC as below in setting of CLL.   3. Anxiety - Per primary  4. CLL  - Followed by Dr. Marin Olp.  - WBC have ran 14.0 - 18.5 this admit. This is near her chronic baseline, slightly higher with intertrigo as above.   5. Cirrhosis - Albumin 2.1 on admission. 2.0 11/06/2018. Contributing to her anasarca. Will discuss with MD.  - Total bili 4.1.  - US Abdomen 06/06/2016 with nodular hepatic contour with course hepatic echotexture, suggestive of cirrhosis.   Medication concerns reviewed with patient and pharmacy team. Barriers identified: None at this time.   Length of Stay: 1  Annamaria Helling  11/08/2018, 1:41 PM  Advanced Heart Failure Team Pager [phone] (M-F; 7a - 4p)  Please contact [facility] for night-coverage after hours (4p -7a ) and weekends on [email or web address]  Patient seen and examined with the above-signed Advanced Practice Provider and/or Housestaff. I personally reviewed laboratory data, imaging studies and relevant notes. I independently examined the patient and formulated the important aspects of the plan. I have edited the note to reflect any of my changes or salient points. I have personally discussed the plan with the patient and/or family.  85 y/o woman with multiple medical problems including obesity, diastolic HF, cirrhosis, CLL. Admitted with ~20 pounds of volume overload with R>>L HF symptoms. Has responded sluggishly to IV lasix. EF 60-65% on Echo with mild to moderate PH. Will wrap legs with UNNA boots, increase lasix and add metolazone. Watch renal function and electrolytes closely. If not responding would repeat ab u/s to assess  liver and also assess for ascites.   Glori Bickers, MD  8:19 PM

## 2018-11-08 NOTE — Progress Notes (Signed)
PROGRESS NOTE  Karen Richard  HQI:[id number] DOB: 04-03-1927 DOA: 11/04/2018 PCP: Caren Macadam, MD  Outpatient Specialists: Vascular surgery, Dr. Donzetta Matters Brief Narrative: Karen Richard is an 85 y.o. female with a history of NASH cirrhosis, chronic diastolic CHF, breast CA s/p bilateral mastectomy remotely, CLL under supervision, venous stasis among others who presented to the ED from her PCP's office 11/04/2018 with increasing swelling in the legs and left arm associated with ~25lbs weight gain over several months. Work up as outpatient including venous U/S without evidence of DVT, vascular surgery recommended elevation and exercise in swimming pool for graded compression. She also had lower back pain attributed to compression fracture which has made her more sedentary.   Assessment & Plan: Principal Problem:   Acute exacerbation of CHF (congestive heart failure) (HCC) Active Problems:   Cirrhosis of liver without ascites (HCC)   Edema of upper extremity   Intertrigo   Leukocytosis   Hypoalbuminemia   Elevated LFTs   Anxiety   Chronic back pain   CHF exacerbation (HCC)  Volume overload/anasarca: With lower extremity edema, some abdominal edema and distention, upper extremity involvement. Multifactorial with cirrhosis/hypoalbuminemia (albumin 2.0), chronic venous stasis as evidenced by dermatitis, lymphedema, and heart failure. LE dopplers ruled out DVT at [facility] office, no DVT on LUE U/S either. No protein on UA.  - Encouraged compression stockings and elevation, though patient reports this doesn't help. May benefit from unna's boots.  - Diurese as below - ?if spironolactone should be added - Dietitian consulted - Check TSH  Acute on chronic diastolic CHF: BNP not impressively elevated, symptoms are predominantly peripheral, though echo shows normal RV function, albeit pulm HTN, and normal caliber IVC. - Weight down modestly since arrival, but remains grossly volume overloaded. Weight  was 174lbs at discharge after Nov 2018 admission for peripheral edema. Also was 79.4kg (~174lbs) at office visit w/Dr. Donzetta Matters 10/18/2018. - Anticipate augmentation of diuresis. With the amount of edema she has diffusely, suspect significant gut edema would not allow for reliable absorption of po lasix as outpatient.   - Strict I/O, daily weight, monitor BMP regularly - Would appreciate cardiology weighing in.   NASH cirrhosis: Seen on imaging at least back to 2017. Also has splenomegaly and thrombocytopenia, chronic LFT elevation.  - Trend CMP - Check U/S, may benefit from paracentesis  Hyperkeratotic lesion: Arising from inferior border of scar s/p right TKA. By report this has not healed for months.  - This needs to be biopsied. Discussed with family and they will follow up with PCP.   Intertrigo of panniculus - Continue topical clotrimazole and keep the area as dry as possible  CLL with chronic leukocytosis: Stage A, under q6 month surveillance by Dr. Marin Olp. Afebrile with no symptoms of UTI or pneumonia. Note small pyuria on UA. Doubt intertrigo is causing leukocytosis.  - Monitor off antibiotics.   Anemia of chronic disease: Hgb stable around baseline. Iron studies 2017 showed elevated ferritin, iron, %sat. - Monitor, doubt this is causing edema.  Anxiety - Continue home ativan 0.5 mg daily PRN  Chronic back pain: - Prefer to limit sedating medications at her age and avoid tylenol if able with cirrhosis.  History of breast CA: s/p bilateral mastectomy 1999.   DVT prophylaxis: Lovenox Code Status: DNR Family Communication: Husband at bedside Disposition Plan: Uncertain, though she will require IV diuresis for diffuse volume overload. She is 25 lbs above the weight recorded 3 weeks prior. Cardiology is consulted for assistance. Dr. Haroldine Laws contacted  the hospitalist team on day of planned discharge to recommend she remain for IV diuresis. The level of diuresis and concurrent  monitoring required makes an inpatient admission appropriate and reasonable.   Consultants:   Cardiology  Procedures:   Echocardiogram 11/05/2018: - Left ventricle: The cavity size was normal. Systolic function was   normal. The estimated ejection fraction was in the range of 60%   to 65%. Wall motion was normal; there were no regional wall   motion abnormalities. - Mitral valve: There was trivial regurgitation. - Tricuspid valve: There was mild regurgitation. - Pulmonary arteries: PA peak pressure: 48 mm Hg (S).  Impressions: - The right ventricular systolic pressure was increased consistent   with moderate pulmonary hypertension. ------------------------------------------------------------------- Left ventricle:  The cavity size was normal. Systolic function was normal. The estimated ejection fraction was in the range of 60% to 65%. Wall motion was normal; there were no regional wall motion abnormalities. ------------------------------------------------------------------- Aortic valve:   Trileaflet; normal thickness leaflets. Mobility was not restricted.  Doppler:  Transvalvular velocity was within the normal range. There was no stenosis. There was no regurgitation. ------------------------------------------------------------------- Aorta:  Aortic root: The aortic root was normal in size. ------------------------------------------------------------------- Mitral valve:   Structurally normal valve.   Mobility was not restricted.  Doppler:  Transvalvular velocity was within the normal range. There was no evidence for stenosis. There was trivial regurgitation.    Peak gradient (D): 7 mm Hg. ------------------------------------------------------------------- Left atrium:  The atrium was normal in size. ------------------------------------------------------------------- Right ventricle:  The cavity size was normal. Wall thickness was normal. Systolic function was  normal. ------------------------------------------------------------------- Pulmonic valve:    Structurally normal valve.   Cusp separation was normal.  Doppler:  Transvalvular velocity was within the normal range. There was no evidence for stenosis. There was no regurgitation. ------------------------------------------------------------------- Tricuspid valve:   Structurally normal valve.    Doppler: Transvalvular velocity was within the normal range. There was mild regurgitation. ------------------------------------------------------------------- Pulmonary artery:   The main pulmonary artery was normal-sized. Systolic pressure was within the normal range. ------------------------------------------------------------------- Right atrium:  The atrium was normal in size. ------------------------------------------------------------------- Pericardium:  There was no pericardial effusion. ------------------------------------------------------------------- Systemic veins: Inferior vena cava: The vessel was normal in size.  Antimicrobials:  None  Subjective: Leg swelling is severe, constant, not improved with elevation, and not appreciably improved since admission. She has dyspnea on exertion but no orthopnea currently. No chest pain. Frustrated with sluggish response thus far.  Objective: Vitals:   11/07/18 1939 11/08/18 0259 11/08/18 0626 11/08/18 1106  BP: (!) 144/48  (!) 145/49 (!) 122/49  Pulse: 96  91 96  Resp: 18  (!) 21   Temp: 98.1 F (36.7 C)  98.7 F (37.1 C)   TempSrc: Oral  Oral   SpO2: 95%  93% 92%  Weight:  89.9 kg    Height:        Intake/Output Summary (Last 24 hours) at 11/08/2018 1815 Last data filed at 11/08/2018 1814 Gross per 24 hour  Intake 600 ml  Output 501 ml  Net 99 ml   Filed Weights   11/06/18 0110 11/07/18 0250 11/08/18 0259  Weight: 89.7 kg 89.9 kg 89.9 kg    Gen: Elderly, obese female in no distress Pulm: Non-labored breathing. Clear to  auscultation bilaterally.  CV: Regular rate and rhythm. No murmur, rub, or gallop. + JVD. GI: Abdomen distended, non-tender, normoactive bowel sounds. Ext: Warm, 3+ edema in bilateral legs to the thighs and somewhat involving abdominal wall. Also chronic  venous stasis changes anteriorly. Skin: As above, and small irregular annular eschar/hyperkeratotic raised lesion on inferior scar edge right shin. Diffuse SebK's also noted. Neuro: Alert and oriented. No focal neurological deficits. Psych: Judgement and insight appear normal. Mood & affect appropriate.   Data Reviewed: I have personally reviewed following labs and imaging studies  CBC: Recent Labs  Lab 11/04/18 1550 11/05/18 0522 11/06/18 0809  WBC 18.5* 14.0* 18.2*  NEUTROABS 12.4*  --   --   HGB 11.7* 11.0* 11.4*  HCT 35.1* 31.3* 33.4*  MCV 99.4 98.7 97.1  PLT 115* 106* 811*   Basic Metabolic Panel: Recent Labs  Lab 11/04/18 1550 11/05/18 0522 11/06/18 0809 11/07/18 0417 11/08/18 0538  NA 135 139 137 136 135  K 4.1 4.5 3.9 3.8 3.9  CL 103 105 100 100 99  CO2 26 30 30 30 30   GLUCOSE 134* 115* 103* 112* 102*  BUN 12 14 16 19 17   CREATININE 0.82 0.85 0.82 0.93 0.78  CALCIUM 8.5* 8.4* 8.4* 8.0* 8.0*   GFR: Estimated Creatinine Clearance: 49.7 mL/min (by C-G formula based on SCr of 0.78 mg/dL). Liver Function Tests: Recent Labs  Lab 11/04/18 1550 11/06/18 0809  AST 102* 98*  ALT 36 34  ALKPHOS 229* 199*  BILITOT 4.0* 4.1*  PROT 5.1* 5.0*  ALBUMIN 2.1* 2.0*   No results for input(s): LIPASE, AMYLASE in the last 168 hours. No results for input(s): AMMONIA in the last 168 hours. Coagulation Profile: No results for input(s): INR, PROTIME in the last 168 hours. Cardiac Enzymes: No results for input(s): CKTOTAL, CKMB, CKMBINDEX, TROPONINI in the last 168 hours. BNP (last 3 results) No results for input(s): PROBNP in the last 8760 hours. HbA1C: No results for input(s): HGBA1C in the last 72 hours. CBG: No  results for input(s): GLUCAP in the last 168 hours. Lipid Profile: No results for input(s): CHOL, HDL, LDLCALC, TRIG, CHOLHDL, LDLDIRECT in the last 72 hours. Thyroid Function Tests: No results for input(s): TSH, T4TOTAL, FREET4, T3FREE, THYROIDAB in the last 72 hours. Anemia Panel: No results for input(s): VITAMINB12, FOLATE, FERRITIN, TIBC, IRON, RETICCTPCT in the last 72 hours. Urine analysis:    Component Value Date/Time   COLORURINE YELLOW 11/04/2018 2011   APPEARANCEUR CLEAR 11/04/2018 2011   LABSPEC 1.005 11/04/2018 2011   PHURINE 6.0 11/04/2018 2011   GLUCOSEU NEGATIVE 11/04/2018 2011   HGBUR NEGATIVE 11/04/2018 2011   HGBUR negative 02/01/2010 [place] 11/04/2018 2011   BILIRUBINUR neg 01/11/2011 [place] 11/04/2018 2011   PROTEINUR NEGATIVE 11/04/2018 2011   UROBILINOGEN 0.2 01/11/2011 1449   UROBILINOGEN 0.2 02/01/2010 1012   NITRITE NEGATIVE 11/04/2018 2011   LEUKOCYTESUR TRACE (A) 11/04/2018 2011   No results found for this or any previous visit (from the past 240 hour(s)).    Radiology Studies: No results found.  Scheduled Meds: . aspirin EC  81 mg Oral q morning - 10a  . clotrimazole   Topical BID  . dextromethorphan-guaiFENesin  1 tablet Oral BID  . enoxaparin (LOVENOX) injection  40 mg Subcutaneous Q24H  . feeding supplement  1 Container Oral TID BM  . furosemide  80 mg Intravenous BID  . potassium chloride  20 mEq Oral Daily   Continuous Infusions:   LOS: 1 day   Time spent: 35 minutes.  Patrecia Pour, MD [facility] [email or web address] Password [facility] 11/08/2018, 6:15 PM

## 2018-11-09 ENCOUNTER — Inpatient Hospital Stay (HOSPITAL_COMMUNITY): Payer: Medicare Other

## 2018-11-09 LAB — CBC WITH DIFFERENTIAL/PLATELET
Abs Immature Granulocytes: 0.08 10*3/uL — ABNORMAL HIGH (ref 0.00–0.07)
Basophils Absolute: 0.2 10*3/uL — ABNORMAL HIGH (ref 0.0–0.1)
Basophils Relative: 1 %
Eosinophils Absolute: 0.4 10*3/uL (ref 0.0–0.5)
Eosinophils Relative: 2 %
HCT: 31 % — ABNORMAL LOW (ref 36.0–46.0)
Hemoglobin: 10.8 g/dL — ABNORMAL LOW (ref 12.0–15.0)
Immature Granulocytes: 1 %
Lymphocytes Relative: 50 %
Lymphs Abs: 8.6 10*3/uL — ABNORMAL HIGH (ref 0.7–4.0)
MCH: 34.3 pg — ABNORMAL HIGH (ref 26.0–34.0)
MCHC: 34.8 g/dL (ref 30.0–36.0)
MCV: 98.4 fL (ref 80.0–100.0)
Monocytes Absolute: 3.3 10*3/uL — ABNORMAL HIGH (ref 0.1–1.0)
Monocytes Relative: 19 %
NRBC: 0 % (ref 0.0–0.2)
Neutro Abs: 4.6 10*3/uL (ref 1.7–7.7)
Neutrophils Relative %: 27 %
Platelets: 125 10*3/uL — ABNORMAL LOW (ref 150–400)
RBC: 3.15 MIL/uL — ABNORMAL LOW (ref 3.87–5.11)
RDW: 17 % — AB (ref 11.5–15.5)
WBC: 17.1 10*3/uL — ABNORMAL HIGH (ref 4.0–10.5)

## 2018-11-09 LAB — COMPREHENSIVE METABOLIC PANEL
ALT: 36 U/L (ref 0–44)
AST: 105 U/L — ABNORMAL HIGH (ref 15–41)
Albumin: 2 g/dL — ABNORMAL LOW (ref 3.5–5.0)
Alkaline Phosphatase: 186 U/L — ABNORMAL HIGH (ref 38–126)
Anion gap: 10 (ref 5–15)
BUN: 16 mg/dL (ref 8–23)
CO2: 31 mmol/L (ref 22–32)
Calcium: 8.1 mg/dL — ABNORMAL LOW (ref 8.9–10.3)
Chloride: 96 mmol/L — ABNORMAL LOW (ref 98–111)
Creatinine, Ser: 0.85 mg/dL (ref 0.44–1.00)
GFR calc Af Amer: >60 mL/min (ref >60)
GFR calc non Af Amer: >60 mL/min (ref >60)
Glucose, Bld: 108 mg/dL — ABNORMAL HIGH (ref 70–99)
Potassium: 3.4 mmol/L — ABNORMAL LOW (ref 3.5–5.1)
Sodium: 137 mmol/L (ref 135–145)
Total Bilirubin: 3.2 mg/dL — ABNORMAL HIGH (ref 0.3–1.2)
Total Protein: 4.7 g/dL — ABNORMAL LOW (ref 6.5–8.1)

## 2018-11-09 LAB — TSH: TSH: 3.018 u[IU]/mL (ref 0.350–4.500)

## 2018-11-09 MED ORDER — SPIRONOLACTONE 25 MG PO TABS
25.0000 mg | ORAL_TABLET | Freq: Every day | ORAL | Status: DC
  Administered 2018-11-09 – 2018-11-12 (×4): 25 mg via ORAL
  Filled 2018-11-09 (×4): qty 1

## 2018-11-09 MED ORDER — LIDOCAINE HCL (PF) 1 % IJ SOLN
INTRAMUSCULAR | Status: AC
  Filled 2018-11-09: qty 30

## 2018-11-09 MED ORDER — ENSURE ENLIVE PO LIQD
237.0000 mL | Freq: Two times a day (BID) | ORAL | Status: DC
  Administered 2018-11-09 – 2018-11-12 (×5): 237 mL via ORAL

## 2018-11-09 MED ORDER — POTASSIUM CHLORIDE CRYS ER 20 MEQ PO TBCR
20.0000 meq | EXTENDED_RELEASE_TABLET | Freq: Two times a day (BID) | ORAL | Status: DC
  Administered 2018-11-09 (×2): 20 meq via ORAL
  Filled 2018-11-09 (×2): qty 1

## 2018-11-09 MED ORDER — METOLAZONE 2.5 MG PO TABS
2.5000 mg | ORAL_TABLET | Freq: Once | ORAL | Status: AC
  Administered 2018-11-09: 2.5 mg via ORAL
  Filled 2018-11-09: qty 1

## 2018-11-09 NOTE — Plan of Care (Signed)
  Problem: Education: Goal: Ability to demonstrate management of disease process will improve Outcome: Progressing   Problem: Cardiac: Goal: Ability to achieve and maintain adequate cardiopulmonary perfusion will improve Outcome: Progressing   Problem: Clinical Measurements: Goal: Ability to maintain clinical measurements within normal limits will improve Outcome: Progressing   Problem: Clinical Measurements: Goal: Diagnostic test results will improve Outcome: Progressing

## 2018-11-09 NOTE — Progress Notes (Signed)
PROGRESS NOTE  Karen Richard  DTO:[id number] DOB: 15-Jun-1927 DOA: 11/04/2018 PCP: Caren Macadam, MD  Outpatient Specialists: Vascular surgery, Dr. Donzetta Matters Brief Narrative: Karen Richard is an 85 y.o. female with a history of NASH cirrhosis, chronic diastolic CHF, breast CA s/p bilateral mastectomy remotely, CLL under supervision, venous stasis among others who presented to the ED from her PCP's office 11/04/2018 with increasing swelling in the legs and left arm associated with ~25lbs weight gain over several months. Work up as outpatient including venous U/S without evidence of DVT, vascular surgery recommended elevation and exercise in swimming pool for graded compression. She also had lower back pain attributed to compression fracture which has made her more sedentary.   Assessment & Plan: Principal Problem:   Acute exacerbation of CHF (congestive heart failure) (HCC) Active Problems:   Cirrhosis of liver without ascites (HCC)   Edema of upper extremity   Intertrigo   Leukocytosis   Hypoalbuminemia   Elevated LFTs   Anxiety   Chronic back pain   CHF exacerbation (HCC)  Volume overload/anasarca: With lower extremity edema, some abdominal edema and distention, upper extremity involvement. Multifactorial with cirrhosis/hypoalbuminemia (albumin 2.0), chronic venous stasis as evidenced by dermatitis, lymphedema, and heart failure. LE dopplers ruled out DVT at [facility] office, no DVT on LUE U/S either. No protein on UA. TSH wnl. - Agree with unna's boots, placed 1/10. Would benefit from [place] at discharge. - Continue diuresis as below.  - Agree with spironolactone per cardiology.  Acute on chronic diastolic CHF: BNP not impressively elevated, symptoms are predominantly peripheral, though echo shows normal RV function, albeit pulm HTN, and normal caliber IVC. - Continue lasix IV and adjunctive metolazone. Given 1/10 and again 1/11. Volume status improving, weight 195lbs today, was 174lbs which  was a stable baseline in Dec 2019. - Strict I/O, daily weight, monitor BMP regularly  NASH cirrhosis with ascites: Seen on imaging at least back to 2017. Also has splenomegaly and thrombocytopenia, chronic LFT elevation.  - Trend CMP - U/S 1/10 showed progressive cirrhosis and moderate ascites. Paracentesis ordered, though with significant diuresis, there was insufficient ascites for tap 1/11.  Hyperkeratotic lesion: Arising from inferior border of scar s/p right TKA. By report this has not healed for months.  - This needs to be biopsied. Discussed with family and they will follow up with PCP.   Intertrigo of panniculus - Continue topical clotrimazole and keep the area as dry as possible, improving.  CLL with chronic leukocytosis: Stage A, under q6 month surveillance by Dr. Marin Olp. Afebrile with no symptoms of UTI or pneumonia. Note small pyuria on UA. Doubt intertrigo is causing leukocytosis.  - Monitor off antibiotics.   Anemia of chronic disease: Hgb stable around baseline. Iron studies 2017 showed elevated ferritin, iron, %sat. - Monitor, doubt this is causing edema.  Anxiety - Continue home ativan 0.5 mg daily PRN  Chronic back pain: - Prefer to limit sedating medications at her age and avoid tylenol if able with cirrhosis.  History of breast CA: s/p bilateral mastectomy 1999.   Hypokalemia:  - Supplement especially with ongoing diuresis and monitor.  DVT prophylaxis: Lovenox Code Status: DNR Family Communication: Husband at bedside Disposition Plan: Home with home health anticipated once closer to dry weight (~175lbs), currently 195lbs.   Consultants:   Cardiology, Dr. Haroldine Laws  Procedures:   Echocardiogram 11/05/2018: - Left ventricle: The cavity size was normal. Systolic function was   normal. The estimated ejection fraction was in the range of 60%  to 65%. Wall motion was normal; there were no regional wall   motion abnormalities. - Mitral valve: There was  trivial regurgitation. - Tricuspid valve: There was mild regurgitation. - Pulmonary arteries: PA peak pressure: 48 mm Hg (S).  Impressions: - The right ventricular systolic pressure was increased consistent   with moderate pulmonary hypertension. ------------------------------------------------------------------- Left ventricle:  The cavity size was normal. Systolic function was normal. The estimated ejection fraction was in the range of 60% to 65%. Wall motion was normal; there were no regional wall motion abnormalities. ------------------------------------------------------------------- Aortic valve:   Trileaflet; normal thickness leaflets. Mobility was not restricted.  Doppler:  Transvalvular velocity was within the normal range. There was no stenosis. There was no regurgitation. ------------------------------------------------------------------- Aorta:  Aortic root: The aortic root was normal in size. ------------------------------------------------------------------- Mitral valve:   Structurally normal valve.   Mobility was not restricted.  Doppler:  Transvalvular velocity was within the normal range. There was no evidence for stenosis. There was trivial regurgitation.    Peak gradient (D): 7 mm Hg. ------------------------------------------------------------------- Left atrium:  The atrium was normal in size. ------------------------------------------------------------------- Right ventricle:  The cavity size was normal. Wall thickness was normal. Systolic function was normal. ------------------------------------------------------------------- Pulmonic valve:    Structurally normal valve.   Cusp separation was normal.  Doppler:  Transvalvular velocity was within the normal range. There was no evidence for stenosis. There was no regurgitation. ------------------------------------------------------------------- Tricuspid valve:   Structurally normal valve.     Doppler: Transvalvular velocity was within the normal range. There was mild regurgitation. ------------------------------------------------------------------- Pulmonary artery:   The main pulmonary artery was normal-sized. Systolic pressure was within the normal range. ------------------------------------------------------------------- Right atrium:  The atrium was normal in size. ------------------------------------------------------------------- Pericardium:  There was no pericardial effusion. ------------------------------------------------------------------- Systemic veins: Inferior vena cava: The vessel was normal in size.  Antimicrobials:  None  Subjective: Tolerating unnas boots, feels abdominal swelling has gone down significantly but her volume excess still makes mobility a significant exertion.    Objective: Vitals:   11/09/18 0312 11/09/18 0327 11/09/18 0934 11/09/18 1218  BP:  116/72 (!) 127/58 (!) 121/51  Pulse:  91 89 81  Resp:  18 18 18   Temp:  98.4 F (36.9 C)  98 F (36.7 C)  TempSrc:  Oral  Oral  SpO2:  94% 93% 93%  Weight: 88.5 kg     Height:        Intake/Output Summary (Last 24 hours) at 11/09/2018 1820 Last data filed at 11/09/2018 1300 Gross per 24 hour  Intake 717 ml  Output 2601 ml  Net -1884 ml   Filed Weights   11/07/18 0250 11/08/18 0259 11/09/18 0312  Weight: 89.9 kg 89.9 kg 88.5 kg   Gen: 85 y.o. female in no distress Pulm: Nonlabored breathing room air. Diminished laterally, no crackles. CV: Regular rate and rhythm. No murmur, rub, or gallop. + JVD, significant dependent edema extending to abdominal wall.  GI: Abdomen soft, non-tender, non-distended, with normoactive bowel sounds.  Ext: Warm, no deformities Skin: Scattered SebK's and unna's boots obscuring lower legs. No new rashes, lesions or ulcers on visualized skin. Neuro: Alert and oriented. No focal neurological deficits. Psych: Judgement and insight appear fair. Mood euthymic &  affect congruent. Behavior is appropriate.    Data Reviewed: I have personally reviewed following labs and imaging studies  CBC: Recent Labs  Lab 11/04/18 1550 11/05/18 0522 11/06/18 0809 11/09/18 0358  WBC 18.5* 14.0* 18.2* 17.1*  NEUTROABS 12.4*  --   --  4.6  HGB 11.7* 11.0* 11.4* 10.8*  HCT 35.1* 31.3* 33.4* 31.0*  MCV 99.4 98.7 97.1 98.4  PLT 115* 106* 128* 562*   Basic Metabolic Panel: Recent Labs  Lab 11/05/18 0522 11/06/18 0809 11/07/18 0417 11/08/18 0538 11/09/18 0358  NA 139 137 136 135 137  K 4.5 3.9 3.8 3.9 3.4*  CL 105 100 100 99 96*  CO2 30 30 30 30 31   GLUCOSE 115* 103* 112* 102* 108*  BUN 14 16 19 17 16   CREATININE 0.85 0.82 0.93 0.78 0.85  CALCIUM 8.4* 8.4* 8.0* 8.0* 8.1*   GFR: Estimated Creatinine Clearance: 46.4 mL/min (by C-G formula based on SCr of 0.85 mg/dL). Liver Function Tests: Recent Labs  Lab 11/04/18 1550 11/06/18 0809 11/09/18 0358  AST 102* 98* 105*  ALT 36 34 36  ALKPHOS 229* 199* 186*  BILITOT 4.0* 4.1* 3.2*  PROT 5.1* 5.0* 4.7*  ALBUMIN 2.1* 2.0* 2.0*   No results for input(s): LIPASE, AMYLASE in the last 168 hours. No results for input(s): AMMONIA in the last 168 hours. Coagulation Profile: No results for input(s): INR, PROTIME in the last 168 hours. Cardiac Enzymes: No results for input(s): CKTOTAL, CKMB, CKMBINDEX, TROPONINI in the last 168 hours. BNP (last 3 results) No results for input(s): PROBNP in the last 8760 hours. HbA1C: No results for input(s): HGBA1C in the last 72 hours. CBG: No results for input(s): GLUCAP in the last 168 hours. Lipid Profile: No results for input(s): CHOL, HDL, LDLCALC, TRIG, CHOLHDL, LDLDIRECT in the last 72 hours. Thyroid Function Tests: Recent Labs    11/09/18 0404  TSH 3.018   Anemia Panel: No results for input(s): VITAMINB12, FOLATE, FERRITIN, TIBC, IRON, RETICCTPCT in the last 72 hours. Urine analysis:    Component Value Date/Time   COLORURINE YELLOW 11/04/2018 2011    APPEARANCEUR CLEAR 11/04/2018 2011   LABSPEC 1.005 11/04/2018 2011   PHURINE 6.0 11/04/2018 2011   GLUCOSEU NEGATIVE 11/04/2018 2011   HGBUR NEGATIVE 11/04/2018 2011   HGBUR negative 02/01/2010 [place] 11/04/2018 2011   BILIRUBINUR neg 01/11/2011 [place] 11/04/2018 2011   PROTEINUR NEGATIVE 11/04/2018 2011   UROBILINOGEN 0.2 01/11/2011 1449   UROBILINOGEN 0.2 02/01/2010 1012   NITRITE NEGATIVE 11/04/2018 2011   LEUKOCYTESUR TRACE (A) 11/04/2018 2011   No results found for this or any previous visit (from the past 240 hour(s)).    Radiology Studies: US Abdomen Limited  Result Date: 11/09/2018 CLINICAL DATA:  85 year old female with ascites. Evaluate for paracentesis. EXAM: LIMITED ABDOMEN ULTRASOUND FOR ASCITES TECHNIQUE: Limited ultrasound survey for ascites was performed in all four abdominal quadrants. COMPARISON:  Abdominal ultrasound 11/09/2018 FINDINGS: Sonographic interrogation of the 4 quadrants of the abdomen demonstrates mild ascites. There is insufficient ascites for paracentesis at this time. IMPRESSION: Insufficient fluid for paracentesis. Electronically Signed   By: Jacqulynn Cadet M.D.   On: 11/09/2018 11:49   US Abdomen Limited Ruq  Result Date: 11/09/2018 CLINICAL DATA:  Cirrhosis of the liver.  Abdominal distension. EXAM: ULTRASOUND ABDOMEN LIMITED RIGHT UPPER QUADRANT COMPARISON:  Abdominal ultrasound 06/06/2016. CT of the abdomen pelvis 02/08/2016. FINDINGS: Gallbladder: Cholecystectomy. Common bile duct: Diameter: 7.4 mm, within normal limits for age. Liver: The liver is shrunken with a nodular border. There is diffuse increased echotexture. No discrete lesions are present. Portal vein is patent on color Doppler imaging with normal direction of blood flow towards the liver. Moderate abdominal ascites is evident about the liver. IMPRESSION: 1. Progressive changes of  hepatic cirrhosis. 2. No discrete hepatic lesion. 3. Moderate  abdominal ascites. Electronically Signed   By: San Morelle M.D.   On: 11/09/2018 07:49    Scheduled Meds: . aspirin EC  81 mg Oral q morning - 10a  . clotrimazole   Topical BID  . dextromethorphan-guaiFENesin  1 tablet Oral BID  . enoxaparin (LOVENOX) injection  40 mg Subcutaneous Q24H  . feeding supplement (ENSURE ENLIVE)  237 mL Oral BID BM  . furosemide  80 mg Intravenous BID  . potassium chloride  20 mEq Oral BID  . spironolactone  25 mg Oral Daily   Continuous Infusions:   LOS: 2 days   Time spent: 35 minutes.  Patrecia Pour, MD [facility] [email or web address] Password [facility] 11/09/2018, 6:20 PM

## 2018-11-09 NOTE — Plan of Care (Signed)
  Problem: Education: Goal: Ability to demonstrate management of disease process will improve Outcome: Progressing   Problem: Activity: Goal: Risk for activity intolerance will decrease Outcome: Progressing   Problem: Nutrition: Goal: Adequate nutrition will be maintained Outcome: Progressing   Problem: Coping: Goal: Level of anxiety will decrease Outcome: Progressing   Problem: Safety: Goal: Ability to remain free from injury will improve Outcome: Progressing   Problem: Skin Integrity: Goal: Risk for impaired skin integrity will decrease Outcome: Progressing

## 2018-11-09 NOTE — Progress Notes (Signed)
Initial Nutrition Assessment  DOCUMENTATION CODES:   Obesity unspecified  INTERVENTION:    Ensure Enlive po BID, each supplement provides 350 kcal and 20 grams of protein  NUTRITION DIAGNOSIS:   Inadequate oral intake related to poor appetite as evidenced by per patient/family report.  GOAL:   Patient will meet greater than or equal to 90% of their needs  MONITOR:   PO intake, Supplement acceptance, Skin  REASON FOR ASSESSMENT:   Consult Assessment of nutrition requirement/status  ASSESSMENT:   85 yo female with PMH of anxiety, HLD, NASH cirrhosis, CHF, breast CA; CLL neuromuscular D/O who was admitted with acute exacerbation of CHF, anasarca.   Patient reports that she has been eating poorly because she has no appetite. Per flow sheet documentation patient has been consuming 100% of breakfast for the past 3 days. She has been drinking the Ensure supplements and likes them.   Patient with no questions regarding her diet at this time. RD reviewed diet recommendations with patient on 1/7. See progress note for details.  Usual weight ~175 lbs (BMI=32)  Labs and medications reviewed.  Potassium 3.4 (L) Albumin 2 (L) Low albumin is related to significant volume overload, liver dysfunction (hx cirrhosis). Albumin is not a good indicator of nutrition status. With a half life of ~21 days do not expect to see a quick improvement in albumin level.  NUTRITION - FOCUSED PHYSICAL EXAM:    Most Recent Value  Orbital Region  No depletion  Upper Arm Region  No depletion  Thoracic and Lumbar Region  No depletion  Buccal Region  No depletion  Temple Region  Mild depletion  Clavicle Bone Region  Mild depletion  Clavicle and Acromion Bone Region  No depletion  Scapular Bone Region  No depletion  Dorsal Hand  No depletion  Patellar Region  No depletion  Anterior Thigh Region  No depletion  Posterior Calf Region  No depletion  Edema (RD Assessment)  Moderate  Hair  Reviewed   Eyes  Reviewed  Mouth  Reviewed  Skin  Reviewed  Nails  Reviewed       Diet Order:   Diet Order            Diet Heart Room service appropriate? Yes; Fluid consistency: Thin  Diet effective now        Diet - low sodium heart healthy              EDUCATION NEEDS:   Education needs have been addressed  Skin:  Skin Assessment: (MASD to abdomen / groin) Skin Integrity Issues:: Other (Comment) Other: intact rash on abdomen  Last BM:  1/9  Height:   Ht Readings from Last 1 Encounters:  11/04/18 5' 2"  (1.575 m)    Weight:   Wt Readings from Last 1 Encounters:  11/09/18 88.5 kg    Ideal Body Weight:  50 kg  BMI:  Body mass index is 35.7 kg/m.  Estimated Nutritional Needs:   Kcal:  1500-1700  Protein:  80-90 gm  Fluid:  1.5 L    Molli Barrows, RD, LDN, [place] Pager [phone] After Hours Pager [phone]

## 2018-11-09 NOTE — Progress Notes (Signed)
Patient came back from ultrasound. Family members at bedside.

## 2018-11-09 NOTE — Progress Notes (Addendum)
Advanced Heart Failure Rounding Note   Subjective:    IV lasix increase yesterday, metolazone added and UNNA boot placed. Diuresed well all night.  Weight down 3 pounds. Feels better. No orthopnea or PND. Less bloated. No dizziness. Creatinine stable  Ab u/s with progressive hepatic cirrhosis. Moderate ascites.    Objective:   Weight Range:  Vital Signs:   Temp:  [98.2 F (36.8 C)-98.4 F (36.9 C)] 98.4 F (36.9 C) (01/11 0327) Pulse Rate:  [90-96] 91 (01/11 0327) Resp:  [18] 18 (01/11 0327) BP: (116-145)/(42-72) 116/72 (01/11 0327) SpO2:  [92 %-97 %] 94 % (01/11 0327) Weight:  [88.5 kg] 88.5 kg (01/11 0312) Last BM Date: 11/07/18  Weight change: Filed Weights   11/07/18 0250 11/08/18 0259 11/09/18 0312  Weight: 89.9 kg 89.9 kg 88.5 kg    Intake/Output:   Intake/Output Summary (Last 24 hours) at 11/09/2018 0915 Last data filed at 11/09/2018 0600 Gross per 24 hour  Intake 120 ml  Output 2100 ml  Net -1980 ml     Physical Exam: General:  Elderly appearing. Lying flat in bed. No resp difficulty HEENT: normal Neck: supple. JVP 8-9 . Carotids 2+ bilat; no bruits. No lymphadenopathy or thryomegaly appreciated. Cor: PMI nondisplaced. Regular rate & rhythm. No rubs, gallops or murmurs. Lungs: clear Abdomen: obese soft, nontender, mildly distended. No hepatosplenomegaly. No bruits or masses. Good bowel sounds. Extremities: no cyanosis, clubbing, rash, 1-2+ edema + UNNA boots  Neuro: alert & orientedx3, cranial nerves grossly intact. moves all 4 extremities w/o difficulty. Affect pleasant  Telemetry: NSR 90 Personally reviewed   Labs: Basic Metabolic Panel: Recent Labs  Lab 11/05/18 0522 11/06/18 0809 11/07/18 0417 11/08/18 0538 11/09/18 0358  NA 139 137 136 135 137  K 4.5 3.9 3.8 3.9 3.4*  CL 105 100 100 99 96*  CO2 30 30 30 30 31   GLUCOSE 115* 103* 112* 102* 108*  BUN 14 16 19 17 16   CREATININE 0.85 0.82 0.93 0.78 0.85  CALCIUM 8.4* 8.4* 8.0* 8.0*  8.1*    Liver Function Tests: Recent Labs  Lab 11/04/18 1550 11/06/18 0809 11/09/18 0358  AST 102* 98* 105*  ALT 36 34 36  ALKPHOS 229* 199* 186*  BILITOT 4.0* 4.1* 3.2*  PROT 5.1* 5.0* 4.7*  ALBUMIN 2.1* 2.0* 2.0*   No results for input(s): LIPASE, AMYLASE in the last 168 hours. No results for input(s): AMMONIA in the last 168 hours.  CBC: Recent Labs  Lab 11/04/18 1550 11/05/18 0522 11/06/18 0809 11/09/18 0358  WBC 18.5* 14.0* 18.2* 17.1*  NEUTROABS 12.4*  --   --  4.6  HGB 11.7* 11.0* 11.4* 10.8*  HCT 35.1* 31.3* 33.4* 31.0*  MCV 99.4 98.7 97.1 98.4  PLT 115* 106* 128* 125*    Cardiac Enzymes: No results for input(s): CKTOTAL, CKMB, CKMBINDEX, TROPONINI in the last 168 hours.  BNP: BNP (last 3 results) Recent Labs    11/04/18 1520  BNP 97.5    ProBNP (last 3 results) No results for input(s): PROBNP in the last 8760 hours.    Other results:  Imaging: US Abdomen Limited Ruq  Result Date: 11/09/2018 CLINICAL DATA:  Cirrhosis of the liver.  Abdominal distension. EXAM: ULTRASOUND ABDOMEN LIMITED RIGHT UPPER QUADRANT COMPARISON:  Abdominal ultrasound 06/06/2016. CT of the abdomen pelvis 02/08/2016. FINDINGS: Gallbladder: Cholecystectomy. Common bile duct: Diameter: 7.4 mm, within normal limits for age. Liver: The liver is shrunken with a nodular border. There is diffuse increased echotexture. No discrete lesions are present.  Portal vein is patent on color Doppler imaging with normal direction of blood flow towards the liver. Moderate abdominal ascites is evident about the liver. IMPRESSION: 1. Progressive changes of hepatic cirrhosis. 2. No discrete hepatic lesion. 3. Moderate abdominal ascites. Electronically Signed   By: San Morelle M.D.   On: 11/09/2018 07:49      Medications:     Scheduled Medications: . aspirin EC  81 mg Oral q morning - 10a  . clotrimazole   Topical BID  . dextromethorphan-guaiFENesin  1 tablet Oral BID  . enoxaparin  (LOVENOX) injection  40 mg Subcutaneous Q24H  . feeding supplement  1 Container Oral TID BM  . furosemide  80 mg Intravenous BID  . potassium chloride  20 mEq Oral BID     Infusions:   PRN Medications:  LORazepam, MUSCLE RUB   Assessment:   Karen Richard is a 85 y.o. female with h/o chronic diastolic CHF, anxiety, and HLD.    Sent to [facility] 11/04/2018 with significant BLE edema and worsening DOE. CHF team consulted for further diuresis.     Plan/Discussion:    1. Acute on chronic diastolic CHF with R>L symptoms - Echo 11/05/2018 LVEF 60-65%, Trivial MR, Mild TR, PA peak pressure 48 mm Hg, and increased RVSP  - Volume status improving with IV lasix. Metolazone and UNNA boots. Weight still up ~10 pounds from baseline. Will continue - With R>L symptoms and ab cirrhosis TTR amyloid a possibility but given age and comorbidities will not pursue formal w/u - [place] VAS 11/04/2018 negative for LUE DVT - US VAS 10/18/18 negative for RLE DVT. Significant interstitial fluid noted.   2. Intertrigo of panniculus - On clotrimazole per primary. Improving.   3. Anxiety - Per primary  4. CLL  - Followed by Dr. Marin Olp.   5. Cirrhosis - Albumin 2.1 on admission. 2.0 11/06/2018. Contributing to her anasarca. Will discuss with MD.  - Total bili 4.1.  - US Abdomen 11/08/18 with progressive hepatic cirrhosis - likely contributing to volume overload. Will add spiro. Can do paracentesis if ascites persists after diuresis.  6. Hypokalemia - will supp and add spiro  Length of Stay: 2   Glori Bickers MD 11/09/2018, 9:15 AM  Advanced Heart Failure Team Pager [phone] (M-F; [place])  Please contact [facility] for night-coverage after hours (4p -7a ) and weekends on [email or web address]

## 2018-11-10 DIAGNOSIS — R188 Other ascites: Secondary | ICD-10-CM

## 2018-11-10 LAB — BASIC METABOLIC PANEL
Anion gap: 10 (ref 5–15)
BUN: 15 mg/dL (ref 8–23)
CHLORIDE: 91 mmol/L — AB (ref 98–111)
CO2: 33 mmol/L — ABNORMAL HIGH (ref 22–32)
Calcium: 8.3 mg/dL — ABNORMAL LOW (ref 8.9–10.3)
Creatinine, Ser: 0.99 mg/dL (ref 0.44–1.00)
GFR calc Af Amer: 59 mL/min — ABNORMAL LOW (ref >60)
GFR calc non Af Amer: 51 mL/min — ABNORMAL LOW (ref >60)
Glucose, Bld: 107 mg/dL — ABNORMAL HIGH (ref 70–99)
Potassium: 4.6 mmol/L (ref 3.5–5.1)
Sodium: 134 mmol/L — ABNORMAL LOW (ref 135–145)

## 2018-11-10 LAB — MAGNESIUM: Magnesium: 1.5 mg/dL — ABNORMAL LOW (ref 1.7–2.4)

## 2018-11-10 MED ORDER — MAGNESIUM SULFATE 4 GM/100ML IV SOLN
4.0000 g | Freq: Once | INTRAVENOUS | Status: AC
  Administered 2018-11-10: 4 g via INTRAVENOUS
  Filled 2018-11-10: qty 100

## 2018-11-10 NOTE — Progress Notes (Signed)
Patient states she wants unna boots removed. Patient states she feels like she has restless leg syndrome and the unna boots are making it worse. Unna boots removed per patient request. Muscle rub applied. Patient declines unna boots to be replaced. Education given.

## 2018-11-10 NOTE — Progress Notes (Signed)
Advanced Heart Failure Rounding Note   Subjective:    Remains on IV lasix and metolazone. Weight down another 7 pounds. (12 pounds total). Feels less bloated. No orthopnea or PND. Renal function stable   Ab u/s with progressive hepatic cirrhosis. Moderate ascites.    Objective:   Weight Range:  Vital Signs:   Temp:  [98 F (36.7 C)-98.6 F (37 C)] 98.6 F (37 C) (01/12 0609) Pulse Rate:  [81-91] 91 (01/12 0609) Resp:  [18] 18 (01/12 0609) BP: (121-130)/(37-58) 126/40 (01/12 0609) SpO2:  [91 %-93 %] 91 % (01/12 0609) Weight:  [85.3 kg] 85.3 kg (01/12 0609) Last BM Date: 11/09/18  Weight change: Filed Weights   11/08/18 0259 11/09/18 0312 11/10/18 0609  Weight: 89.9 kg 88.5 kg 85.3 kg    Intake/Output:   Intake/Output Summary (Last 24 hours) at 11/10/2018 0832 Last data filed at 11/10/2018 0500 Gross per 24 hour  Intake 837 ml  Output 2800 ml  Net -1963 ml     Physical Exam: General:  Elderly. Lying in bed. No resp difficulty HEENT: normal Neck: supple. JVP 8-9. Carotids 2+ bilat; no bruits. No lymphadenopathy or thryomegaly appreciated. Cor: PMI nondisplaced. Regular rate & rhythm. No rubs, gallops or murmurs. Lungs: clear Abdomen: obese soft, nontender, nondistended. No hepatosplenomegaly. No bruits or masses. Good bowel sounds. Extremities: no cyanosis, clubbing, rash, 1+ edema + UNNA boots Neuro: alert & orientedx3, cranial nerves grossly intact. moves all 4 extremities w/o difficulty. Affect pleasant   Telemetry: NSR 80-90s Personally reviewed   Labs: Basic Metabolic Panel: Recent Labs  Lab 11/06/18 0809 11/07/18 0417 11/08/18 0538 11/09/18 0358 11/10/18 0621  NA 137 136 135 137 134*  K 3.9 3.8 3.9 3.4* 4.6  CL 100 100 99 96* 91*  CO2 30 30 30 31  33*  GLUCOSE 103* 112* 102* 108* 107*  BUN 16 19 17 16 15   CREATININE 0.82 0.93 0.78 0.85 0.99  CALCIUM 8.4* 8.0* 8.0* 8.1* 8.3*  MG  --   --   --   --  1.5*    Liver Function Tests: Recent  Labs  Lab 11/04/18 1550 11/06/18 0809 11/09/18 0358  AST 102* 98* 105*  ALT 36 34 36  ALKPHOS 229* 199* 186*  BILITOT 4.0* 4.1* 3.2*  PROT 5.1* 5.0* 4.7*  ALBUMIN 2.1* 2.0* 2.0*   No results for input(s): LIPASE, AMYLASE in the last 168 hours. No results for input(s): AMMONIA in the last 168 hours.  CBC: Recent Labs  Lab 11/04/18 1550 11/05/18 0522 11/06/18 0809 11/09/18 0358  WBC 18.5* 14.0* 18.2* 17.1*  NEUTROABS 12.4*  --   --  4.6  HGB 11.7* 11.0* 11.4* 10.8*  HCT 35.1* 31.3* 33.4* 31.0*  MCV 99.4 98.7 97.1 98.4  PLT 115* 106* 128* 125*    Cardiac Enzymes: No results for input(s): CKTOTAL, CKMB, CKMBINDEX, TROPONINI in the last 168 hours.  BNP: BNP (last 3 results) Recent Labs    11/04/18 1520  BNP 97.5    ProBNP (last 3 results) No results for input(s): PROBNP in the last 8760 hours.    Other results:  Imaging: US Abdomen Limited  Result Date: 11/09/2018 CLINICAL DATA:  85 year old female with ascites. Evaluate for paracentesis. EXAM: LIMITED ABDOMEN ULTRASOUND FOR ASCITES TECHNIQUE: Limited ultrasound survey for ascites was performed in all four abdominal quadrants. COMPARISON:  Abdominal ultrasound 11/09/2018 FINDINGS: Sonographic interrogation of the 4 quadrants of the abdomen demonstrates mild ascites. There is insufficient ascites for paracentesis at this time. IMPRESSION:  Insufficient fluid for paracentesis. Electronically Signed   By: Jacqulynn Cadet M.D.   On: 11/09/2018 11:49   US Abdomen Limited Ruq  Result Date: 11/09/2018 CLINICAL DATA:  Cirrhosis of the liver.  Abdominal distension. EXAM: ULTRASOUND ABDOMEN LIMITED RIGHT UPPER QUADRANT COMPARISON:  Abdominal ultrasound 06/06/2016. CT of the abdomen pelvis 02/08/2016. FINDINGS: Gallbladder: Cholecystectomy. Common bile duct: Diameter: 7.4 mm, within normal limits for age. Liver: The liver is shrunken with a nodular border. There is diffuse increased echotexture. No discrete lesions are  present. Portal vein is patent on color Doppler imaging with normal direction of blood flow towards the liver. Moderate abdominal ascites is evident about the liver. IMPRESSION: 1. Progressive changes of hepatic cirrhosis. 2. No discrete hepatic lesion. 3. Moderate abdominal ascites. Electronically Signed   By: San Morelle M.D.   On: 11/09/2018 07:49     Medications:     Scheduled Medications: . aspirin EC  81 mg Oral q morning - 10a  . clotrimazole   Topical BID  . dextromethorphan-guaiFENesin  1 tablet Oral BID  . enoxaparin (LOVENOX) injection  40 mg Subcutaneous Q24H  . feeding supplement (ENSURE ENLIVE)  237 mL Oral BID BM  . furosemide  80 mg Intravenous BID  . spironolactone  25 mg Oral Daily    Infusions:   PRN Medications: LORazepam, MUSCLE RUB   Assessment:   CZARINA GINGRAS is a 85 y.o. female with h/o chronic diastolic CHF, anxiety, and HLD.    Sent to [facility] 11/04/2018 with significant BLE edema and worsening DOE. CHF team consulted for further diuresis.     Plan/Discussion:    1. Acute on chronic diastolic CHF with R>L symptoms - Echo 11/05/2018 LVEF 60-65%, Trivial MR, Mild TR, PA peak pressure 48 mm Hg, and increased RVSP  - Volume status improving with IV lasix and metolazone and UNNA boots. Weight down 12 pounds. Weight still up ~10 pounds from baseline. Renal function stable Will continue - With R>L symptoms and ab cirrhosis TTR amyloid a possibility but given age and comorbidities will not pursue formal w/u - [place] VAS 11/04/2018 negative for LUE DVT - US VAS 10/18/18 negative for RLE DVT. Significant interstitial fluid noted.   2. Intertrigo of panniculus - On clotrimazole per primary. Improving.   3. Anxiety - Per primary  4. CLL  - Followed by Dr. Marin Olp.   5. Cirrhosis - Albumin 2.1 on admission. 2.0 11/06/2018. Contributing to her anasarca. Will discuss with MD.  - Total bili 4.1.  - US Abdomen 11/08/18 with progressive hepatic  cirrhosis - likely contributing to volume overload. Arlyce Harman added. Can do paracentesis if ascites persists after diuresis.  6. Hypokalemia - K 4.6 today. Watch closely with addition of spiro yesterday  7. Hypomagensemia. - will supp with 4g IV  Length of Stay: 3   Glori Bickers MD 11/10/2018, 8:32 AM  Advanced Heart Failure Team Pager [phone] (M-F; 7a - 4p)  Please contact [facility] for night-coverage after hours (4p -7a ) and weekends on [email or web address]

## 2018-11-10 NOTE — Progress Notes (Signed)
PROGRESS NOTE  LOA IDLER  IOX:[id number] DOB: 1927/01/01 DOA: 11/04/2018 PCP: Caren Macadam, MD  Outpatient Specialists: Vascular surgery, Dr. Donzetta Matters Brief Narrative: Karen Richard is an 85 y.o. female with a history of NASH cirrhosis, chronic diastolic CHF, breast CA s/p bilateral mastectomy remotely, CLL under supervision, venous stasis among others who presented to the ED from her PCP's office 11/04/2018 with increasing swelling in the legs and left arm associated with ~25lbs weight gain over several months. Work up as outpatient including venous U/S without evidence of DVT, vascular surgery recommended elevation and exercise in swimming pool for graded compression. She also had lower back pain attributed to compression fracture which has made her more sedentary.   Assessment & Plan: Principal Problem:   Acute exacerbation of CHF (congestive heart failure) (HCC) Active Problems:   Cirrhosis of liver without ascites (HCC)   Edema of upper extremity   Intertrigo   Leukocytosis   Hypoalbuminemia   Elevated LFTs   Anxiety   Chronic back pain   CHF exacerbation (HCC)   Ascites  Volume overload/anasarca: With lower extremity edema, some abdominal edema and distention, upper extremity involvement. Multifactorial with cirrhosis/hypoalbuminemia (albumin 2.0), chronic venous stasis as evidenced by dermatitis, lymphedema, and heart failure. LE dopplers ruled out DVT at [facility] office, no DVT on LUE U/S either. No protein on UA. TSH wnl. - Agree with unna's boots, placed 1/10. Would benefit from [place] at discharge. - Continue diuresis as below.  - Agree with spironolactone per cardiology.  Acute on chronic diastolic CHF: BNP not impressively elevated, symptoms are predominantly peripheral, though echo shows normal RV function, albeit pulm HTN, and normal caliber IVC. - Continue lasix IV, having excellent diuresis with relatively stable creatinine. No metolazone today since added  spironolactone. Wt down 200lbs > 188 today. CO2 rising, Creatinine 0.99. Will continue monitoring Cr. K 4.6 in light of spironolactone will stop replacement today.  - On low dose spiro, continuing.  Hypomagnesemia:  - Replace and recheck with loop diuretic.  NASH cirrhosis with ascites: Seen on imaging at least back to 2017. Also has splenomegaly and thrombocytopenia, chronic LFT elevation which is stable/trended downward. - U/S 1/10 showed progressive cirrhosis and moderate ascites. Paracentesis ordered, though with significant diuresis, there was insufficient ascites for tap 1/11.  Hyperkeratotic lesion: Arising from inferior border of scar s/p right TKA. By report this has not healed for months.  - This needs to be biopsied. Discussed with family and they will follow up with PCP.   Intertrigo of panniculus - Continue topical clotrimazole and keep the area as dry as possible, improving.  CLL with chronic leukocytosis: Stage A, under q6 month surveillance by Dr. Marin Olp. Afebrile with no symptoms of UTI or pneumonia. Note small pyuria on UA. Doubt intertrigo is causing leukocytosis.  - Monitor off antibiotics.   Anemia of chronic disease: Hgb stable around baseline. Iron studies 2017 showed elevated ferritin, iron, %sat. - Monitor, doubt this is causing edema.  Anxiety - Continue home ativan 0.5 mg daily PRN  Chronic back pain: - Prefer to limit sedating medications at her age and avoid tylenol if able with cirrhosis.  History of breast CA: s/p bilateral mastectomy 1999.   Hypokalemia:  - Supplement especially with ongoing diuresis and monitor.  DVT prophylaxis: Lovenox Code Status: DNR Family Communication: Husband at bedside Disposition Plan: Home with home health anticipated once closer to dry weight (~175lbs).  Consultants:   Cardiology, Dr. Haroldine Laws  Procedures:   Echocardiogram 11/05/2018: - Left  ventricle: The cavity size was normal. Systolic function was    normal. The estimated ejection fraction was in the range of 60%   to 65%. Wall motion was normal; there were no regional wall   motion abnormalities. - Mitral valve: There was trivial regurgitation. - Tricuspid valve: There was mild regurgitation. - Pulmonary arteries: PA peak pressure: 48 mm Hg (S).  Impressions: - The right ventricular systolic pressure was increased consistent   with moderate pulmonary hypertension. ------------------------------------------------------------------- Left ventricle:  The cavity size was normal. Systolic function was normal. The estimated ejection fraction was in the range of 60% to 65%. Wall motion was normal; there were no regional wall motion abnormalities. ------------------------------------------------------------------- Aortic valve:   Trileaflet; normal thickness leaflets. Mobility was not restricted.  Doppler:  Transvalvular velocity was within the normal range. There was no stenosis. There was no regurgitation. ------------------------------------------------------------------- Aorta:  Aortic root: The aortic root was normal in size. ------------------------------------------------------------------- Mitral valve:   Structurally normal valve.   Mobility was not restricted.  Doppler:  Transvalvular velocity was within the normal range. There was no evidence for stenosis. There was trivial regurgitation.    Peak gradient (D): 7 mm Hg. ------------------------------------------------------------------- Left atrium:  The atrium was normal in size. ------------------------------------------------------------------- Right ventricle:  The cavity size was normal. Wall thickness was normal. Systolic function was normal. ------------------------------------------------------------------- Pulmonic valve:    Structurally normal valve.   Cusp separation was normal.  Doppler:  Transvalvular velocity was within the normal range. There was no evidence  for stenosis. There was no regurgitation. ------------------------------------------------------------------- Tricuspid valve:   Structurally normal valve.    Doppler: Transvalvular velocity was within the normal range. There was mild regurgitation. ------------------------------------------------------------------- Pulmonary artery:   The main pulmonary artery was normal-sized. Systolic pressure was within the normal range. ------------------------------------------------------------------- Right atrium:  The atrium was normal in size. ------------------------------------------------------------------- Pericardium:  There was no pericardial effusion. ------------------------------------------------------------------- Systemic veins: Inferior vena cava: The vessel was normal in size.  Antimicrobials:  None  Subjective: Feels worn out/weak with some lightheadedness. No dyspnea, orthopnea. Abdomen feels "flattened" and less bloated. Making a lot of urine, up to chair this AM.  Objective: Vitals:   11/09/18 1218 11/09/18 1925 11/09/18 2205 11/10/18 0609  BP: (!) 121/51 (!) 130/37  (!) 126/40  Pulse: 81 81  91  Resp: 18 18  18   Temp: 98 F (36.7 C) 98.6 F (37 C)  98.6 F (37 C)  TempSrc: Oral Oral  Oral  SpO2: 93% 91% 93% 91%  Weight:    85.3 kg  Height:        Intake/Output Summary (Last 24 hours) at 11/10/2018 1135 Last data filed at 11/10/2018 0900 Gross per 24 hour  Intake 480 ml  Output 2800 ml  Net -2320 ml   Filed Weights   11/08/18 0259 11/09/18 0312 11/10/18 0609  Weight: 89.9 kg 88.5 kg 85.3 kg   Gen: Fatigued-appearing elderly female in no distress Pulm: Nonlabored breathing room air. Clear. CV: Regular rate and rhythm. No murmur, rub, or gallop. + JVD, 1+ pitting dependent edema around unna boots. GI: Abdomen soft, non-tender, non-distended, with normoactive bowel sounds.  Ext: Warm, no deformities Skin: Diffuse sebK's, scattered skin tags. No new  rashes, lesions or ulcers on visualized skin. Unna's boots on bilaterally. Neuro: Alert and oriented. No focal neurological deficits. Psych: Judgement and insight appear fair. Mood euthymic & affect congruent. Behavior is appropriate.    Data Reviewed: I have personally reviewed following labs and imaging studies  CBC: Recent Labs  Lab 11/04/18 1550 11/05/18 0522 11/06/18 0809 11/09/18 0358  WBC 18.5* 14.0* 18.2* 17.1*  NEUTROABS 12.4*  --   --  4.6  HGB 11.7* 11.0* 11.4* 10.8*  HCT 35.1* 31.3* 33.4* 31.0*  MCV 99.4 98.7 97.1 98.4  PLT 115* 106* 128* 937*   Basic Metabolic Panel: Recent Labs  Lab 11/06/18 0809 11/07/18 0417 11/08/18 0538 11/09/18 0358 11/10/18 0621  NA 137 136 135 137 134*  K 3.9 3.8 3.9 3.4* 4.6  CL 100 100 99 96* 91*  CO2 30 30 30 31  33*  GLUCOSE 103* 112* 102* 108* 107*  BUN 16 19 17 16 15   CREATININE 0.82 0.93 0.78 0.85 0.99  CALCIUM 8.4* 8.0* 8.0* 8.1* 8.3*  MG  --   --   --   --  1.5*   GFR: Estimated Creatinine Clearance: 39 mL/min (by C-G formula based on SCr of 0.99 mg/dL). Liver Function Tests: Recent Labs  Lab 11/04/18 1550 11/06/18 0809 11/09/18 0358  AST 102* 98* 105*  ALT 36 34 36  ALKPHOS 229* 199* 186*  BILITOT 4.0* 4.1* 3.2*  PROT 5.1* 5.0* 4.7*  ALBUMIN 2.1* 2.0* 2.0*   No results for input(s): LIPASE, AMYLASE in the last 168 hours. No results for input(s): AMMONIA in the last 168 hours. Coagulation Profile: No results for input(s): INR, PROTIME in the last 168 hours. Cardiac Enzymes: No results for input(s): CKTOTAL, CKMB, CKMBINDEX, TROPONINI in the last 168 hours. BNP (last 3 results) No results for input(s): PROBNP in the last 8760 hours. HbA1C: No results for input(s): HGBA1C in the last 72 hours. CBG: No results for input(s): GLUCAP in the last 168 hours. Lipid Profile: No results for input(s): CHOL, HDL, LDLCALC, TRIG, CHOLHDL, LDLDIRECT in the last 72 hours. Thyroid Function Tests: Recent Labs     11/09/18 0404  TSH 3.018   Anemia Panel: No results for input(s): VITAMINB12, FOLATE, FERRITIN, TIBC, IRON, RETICCTPCT in the last 72 hours. Urine analysis:    Component Value Date/Time   COLORURINE YELLOW 11/04/2018 2011   APPEARANCEUR CLEAR 11/04/2018 2011   LABSPEC 1.005 11/04/2018 2011   PHURINE 6.0 11/04/2018 2011   GLUCOSEU NEGATIVE 11/04/2018 2011   HGBUR NEGATIVE 11/04/2018 2011   HGBUR negative 02/01/2010 [place] 11/04/2018 2011   BILIRUBINUR neg 01/11/2011 [place] 11/04/2018 2011   PROTEINUR NEGATIVE 11/04/2018 2011   UROBILINOGEN 0.2 01/11/2011 1449   UROBILINOGEN 0.2 02/01/2010 1012   NITRITE NEGATIVE 11/04/2018 2011   LEUKOCYTESUR TRACE (A) 11/04/2018 2011   No results found for this or any previous visit (from the past 240 hour(s)).    Radiology Studies: US Abdomen Limited  Result Date: 11/09/2018 CLINICAL DATA:  85 year old female with ascites. Evaluate for paracentesis. EXAM: LIMITED ABDOMEN ULTRASOUND FOR ASCITES TECHNIQUE: Limited ultrasound survey for ascites was performed in all four abdominal quadrants. COMPARISON:  Abdominal ultrasound 11/09/2018 FINDINGS: Sonographic interrogation of the 4 quadrants of the abdomen demonstrates mild ascites. There is insufficient ascites for paracentesis at this time. IMPRESSION: Insufficient fluid for paracentesis. Electronically Signed   By: Jacqulynn Cadet M.D.   On: 11/09/2018 11:49   US Abdomen Limited Ruq  Result Date: 11/09/2018 CLINICAL DATA:  Cirrhosis of the liver.  Abdominal distension. EXAM: ULTRASOUND ABDOMEN LIMITED RIGHT UPPER QUADRANT COMPARISON:  Abdominal ultrasound 06/06/2016. CT of the abdomen pelvis 02/08/2016. FINDINGS: Gallbladder: Cholecystectomy. Common bile duct: Diameter: 7.4 mm, within normal limits for age. Liver: The liver is shrunken with  a nodular border. There is diffuse increased echotexture. No discrete lesions are present. Portal vein is patent on color  Doppler imaging with normal direction of blood flow towards the liver. Moderate abdominal ascites is evident about the liver. IMPRESSION: 1. Progressive changes of hepatic cirrhosis. 2. No discrete hepatic lesion. 3. Moderate abdominal ascites. Electronically Signed   By: San Morelle M.D.   On: 11/09/2018 07:49    Scheduled Meds: . aspirin EC  81 mg Oral q morning - 10a  . clotrimazole   Topical BID  . dextromethorphan-guaiFENesin  1 tablet Oral BID  . enoxaparin (LOVENOX) injection  40 mg Subcutaneous Q24H  . feeding supplement (ENSURE ENLIVE)  237 mL Oral BID BM  . furosemide  80 mg Intravenous BID  . spironolactone  25 mg Oral Daily   Continuous Infusions: . magnesium sulfate 1 - 4 g bolus IVPB 4 g (11/10/18 1028)     LOS: 3 days   Time spent: 35 minutes.  Patrecia Pour, MD [facility] [email or web address] Password [facility] 11/10/2018, 11:35 AM

## 2018-11-11 DIAGNOSIS — L304 Erythema intertrigo: Secondary | ICD-10-CM

## 2018-11-11 DIAGNOSIS — E8809 Other disorders of plasma-protein metabolism, not elsewhere classified: Secondary | ICD-10-CM

## 2018-11-11 DIAGNOSIS — R600 Localized edema: Secondary | ICD-10-CM

## 2018-11-11 DIAGNOSIS — F419 Anxiety disorder, unspecified: Secondary | ICD-10-CM

## 2018-11-11 DIAGNOSIS — D72829 Elevated white blood cell count, unspecified: Secondary | ICD-10-CM

## 2018-11-11 DIAGNOSIS — G8929 Other chronic pain: Secondary | ICD-10-CM

## 2018-11-11 DIAGNOSIS — M549 Dorsalgia, unspecified: Secondary | ICD-10-CM

## 2018-11-11 DIAGNOSIS — R945 Abnormal results of liver function studies: Secondary | ICD-10-CM

## 2018-11-11 DIAGNOSIS — I509 Heart failure, unspecified: Secondary | ICD-10-CM

## 2018-11-11 LAB — BASIC METABOLIC PANEL
Anion gap: 10 (ref 5–15)
BUN: 20 mg/dL (ref 8–23)
CO2: 35 mmol/L — ABNORMAL HIGH (ref 22–32)
Calcium: 8.6 mg/dL — ABNORMAL LOW (ref 8.9–10.3)
Chloride: 89 mmol/L — ABNORMAL LOW (ref 98–111)
Creatinine, Ser: 1.09 mg/dL — ABNORMAL HIGH (ref 0.44–1.00)
GFR calc Af Amer: 52 mL/min — ABNORMAL LOW (ref >60)
GFR calc non Af Amer: 45 mL/min — ABNORMAL LOW (ref >60)
Glucose, Bld: 95 mg/dL (ref 70–99)
POTASSIUM: 3.6 mmol/L (ref 3.5–5.1)
Sodium: 134 mmol/L — ABNORMAL LOW (ref 135–145)

## 2018-11-11 LAB — MAGNESIUM: MAGNESIUM: 1.9 mg/dL (ref 1.7–2.4)

## 2018-11-11 MED ORDER — POTASSIUM CHLORIDE CRYS ER 20 MEQ PO TBCR: 40.0000 meq | EXTENDED_RELEASE_TABLET | Freq: Once | ORAL | Status: DC

## 2018-11-11 MED ORDER — POTASSIUM CHLORIDE CRYS ER 20 MEQ PO TBCR
40.0000 meq | EXTENDED_RELEASE_TABLET | Freq: Once | ORAL | Status: AC
  Administered 2018-11-11: 40 meq via ORAL
  Filled 2018-11-11: qty 2

## 2018-11-11 MED ORDER — METOLAZONE 2.5 MG PO TABS
2.5000 mg | ORAL_TABLET | Freq: Once | ORAL | Status: AC
  Administered 2018-11-11: 2.5 mg via ORAL
  Filled 2018-11-11: qty 1

## 2018-11-11 MED ORDER — POTASSIUM CHLORIDE CRYS ER 20 MEQ PO TBCR
20.0000 meq | EXTENDED_RELEASE_TABLET | Freq: Once | ORAL | Status: AC
  Administered 2018-11-11: 20 meq via ORAL
  Filled 2018-11-11: qty 1

## 2018-11-11 MED ORDER — MAGNESIUM SULFATE 2 GM/50ML IV SOLN
2.0000 g | Freq: Once | INTRAVENOUS | Status: AC
  Administered 2018-11-11: 2 g via INTRAVENOUS
  Filled 2018-11-11: qty 50

## 2018-11-11 MED ORDER — METOLAZONE 2.5 MG PO TABS: 2.5000 mg | ORAL_TABLET | Freq: Once | ORAL | Status: DC

## 2018-11-11 NOTE — Progress Notes (Signed)
Patient's husband approached RN stating patient had requested he go home for the night because she was going home. When patient clarified she would be going home tomorrow or the next day, she stated, "No, I'm going to [place]" (local funeral home). When husband continued to ask for clarification, patient insisted [place] was [place]. Pt has been noted to be confused at times likely delirium, but husband concerned and wanted event documented.

## 2018-11-11 NOTE — Progress Notes (Signed)
PROGRESS NOTE  Karen Richard  UEA:[id number] DOB: May 23, 1927 DOA: 11/04/2018 PCP: Caren Macadam, MD  Outpatient Specialists: Vascular surgery, Dr. Donzetta Matters Brief Narrative: Karen Richard is an 85 y.o. female with a history of NASH cirrhosis, chronic diastolic CHF, breast CA s/p bilateral mastectomy remotely, CLL under supervision, venous stasis among others who presented to the ED from her PCP's office 11/04/2018 with increasing swelling in the legs and left arm associated with ~25lbs weight gain over several months. Work up as outpatient including venous U/S without evidence of DVT, vascular surgery recommended elevation and exercise in swimming pool for graded compression. She also had lower back pain attributed to compression fracture which has made her more sedentary.   Assessment & Plan: Principal Problem:   Acute exacerbation of CHF (congestive heart failure) (HCC) Active Problems:   Cirrhosis of liver without ascites (HCC)   Edema of upper extremity   Intertrigo   Leukocytosis   Hypoalbuminemia   Elevated LFTs   Anxiety   Chronic back pain   CHF exacerbation (HCC)   Ascites  Volume overload/anasarca: With lower extremity edema, some abdominal edema and distention, upper extremity involvement. Multifactorial with cirrhosis/hypoalbuminemia (albumin 2.0), chronic venous stasis as evidenced by dermatitis, lymphedema, and heart failure. LE dopplers ruled out DVT at [facility] office, no DVT on LUE U/S either. No protein on UA. TSH wnl. - Agree with unna's boots, placed 1/10, though pt requested removal 1/12. Discussed that these would likely help, though no LE wounds that are poorly healing, so will treat with diuresis as below.  Acute on chronic diastolic CHF: BNP not impressively elevated, symptoms are predominantly peripheral, though echo shows normal RV function, albeit pulm HTN, and normal caliber IVC. - Continue lasix IV, continues significant diuresis with stable renal function. From  200lbs now down to 182lbs with presumed baseline being ~174. Widening BUN:Cr and contraction alkalosis noted.  - Appreciate cardiology recommendations. Giving metolazone and started spironolactone. Agree that torsemide is good option for her at discharge. - On low dose spiro, continuing.  Hypomagnesemia:  - Replace and recheck with loop diuretic.  NASH cirrhosis with ascites: Seen on imaging at least back to 2017. Also has splenomegaly and thrombocytopenia, chronic LFT elevation which is stable/trended downward. - U/S 1/10 showed progressive cirrhosis and moderate ascites. Paracentesis ordered, though with significant diuresis, there was insufficient ascites for tap 1/11.  Hyperkeratotic lesion: Arising from inferior border of scar s/p right TKA. By report this has not healed for months.  - This needs to be biopsied. Discussed with family and they will follow up with PCP.   Intertrigo of panniculus - Continue topical clotrimazole and keep the area as dry as possible, improving.  CLL with chronic leukocytosis: Stage A, under q6 month surveillance by Dr. Marin Olp. Afebrile with no symptoms of UTI or pneumonia. Note small pyuria on UA. Doubt intertrigo is causing leukocytosis.  - Monitor off antibiotics.   Anemia of chronic disease: Hgb stable around baseline. Iron studies 2017 showed elevated ferritin, iron, %sat. - Monitor, doubt this is causing edema.  Anxiety - Continue home ativan 0.5 mg daily PRN  Chronic back pain: - Prefer to limit sedating medications at her age and avoid tylenol if able with cirrhosis.  History of breast CA: s/p bilateral mastectomy 1999.   Hypokalemia:  - Supplement especially with ongoing diuresis and monitor.  Acute withdrawn delirium: Most likely explanation for change in mentation/decreased conversation with husband and sparse speech. No focal deficits noted.  - Continue to monitor,  discussed supportive care with husband today.  DVT prophylaxis:  Lovenox Code Status: DNR Family Communication: Husband at bedside Disposition Plan: Home with home health anticipated once closer to dry weight (~175lbs). Possibly 1/15.  Consultants:   Cardiology, Dr. Haroldine Laws  Procedures:   Echocardiogram 11/05/2018: - Left ventricle: The cavity size was normal. Systolic function was   normal. The estimated ejection fraction was in the range of 60%   to 65%. Wall motion was normal; there were no regional wall   motion abnormalities. - Mitral valve: There was trivial regurgitation. - Tricuspid valve: There was mild regurgitation. - Pulmonary arteries: PA peak pressure: 48 mm Hg (S).  Impressions: - The right ventricular systolic pressure was increased consistent   with moderate pulmonary hypertension. ------------------------------------------------------------------- Left ventricle:  The cavity size was normal. Systolic function was normal. The estimated ejection fraction was in the range of 60% to 65%. Wall motion was normal; there were no regional wall motion abnormalities. ------------------------------------------------------------------- Aortic valve:   Trileaflet; normal thickness leaflets. Mobility was not restricted.  Doppler:  Transvalvular velocity was within the normal range. There was no stenosis. There was no regurgitation. ------------------------------------------------------------------- Aorta:  Aortic root: The aortic root was normal in size. ------------------------------------------------------------------- Mitral valve:   Structurally normal valve.   Mobility was not restricted.  Doppler:  Transvalvular velocity was within the normal range. There was no evidence for stenosis. There was trivial regurgitation.    Peak gradient (D): 7 mm Hg. ------------------------------------------------------------------- Left atrium:  The atrium was normal in  size. ------------------------------------------------------------------- Right ventricle:  The cavity size was normal. Wall thickness was normal. Systolic function was normal. ------------------------------------------------------------------- Pulmonic valve:    Structurally normal valve.   Cusp separation was normal.  Doppler:  Transvalvular velocity was within the normal range. There was no evidence for stenosis. There was no regurgitation. ------------------------------------------------------------------- Tricuspid valve:   Structurally normal valve.    Doppler: Transvalvular velocity was within the normal range. There was mild regurgitation. ------------------------------------------------------------------- Pulmonary artery:   The main pulmonary artery was normal-sized. Systolic pressure was within the normal range. ------------------------------------------------------------------- Right atrium:  The atrium was normal in size. ------------------------------------------------------------------- Pericardium:  There was no pericardial effusion. ------------------------------------------------------------------- Systemic veins: Inferior vena cava: The vessel was normal in size.  Antimicrobials:  None  Subjective: Continues to feel she's losing strength but worked with PT and did well today. Denies dyspnea, orthopnea. Felt legs were getting restless with unna boots on, still urinating a lot. Not very conversant and husband is concerned about her withdrawal though she remains oriented.   Objective: Vitals:   11/10/18 1922 11/11/18 0500 11/11/18 1209 11/11/18 1415  BP: (!) 134/44 110/88 (!) 134/47   Pulse: 90 88 88 (!) 110  Resp: 18 20 18    Temp: 98.6 F (37 C) 98.8 F (37.1 C) 98.7 F (37.1 C)   TempSrc: Oral Oral Oral   SpO2: 92% 99% 93% 96%  Weight:  82.7 kg    Height:        Intake/Output Summary (Last 24 hours) at 11/11/2018 1502 Last data filed at 11/11/2018  1429 Gross per 24 hour  Intake 580 ml  Output 2201 ml  Net -1621 ml   Filed Weights   11/09/18 0312 11/10/18 0609 11/11/18 0500  Weight: 88.5 kg 85.3 kg 82.7 kg   Gen: 85 y.o. female in no distress Pulm: Nonlabored breathing room air. Clear. CV: Regular rate and rhythm. No murmur, rub, or gallop. + JVD, 2+ LE dependent edema. GI: Abdomen soft, non-tender, non-distended,  with normoactive bowel sounds.  Ext: Warm, no deformities Skin: No new rashes, lesions or ulcers on visualized skin. Hyperkeratotis lesion on right anterior shin stable. Neuro: Alert and oriented. No focal neurological deficits. Psych: Judgement and insight appear fair, sparse language.  CBC: Recent Labs  Lab 11/04/18 1550 11/05/18 0522 11/06/18 0809 11/09/18 0358  WBC 18.5* 14.0* 18.2* 17.1*  NEUTROABS 12.4*  --   --  4.6  HGB 11.7* 11.0* 11.4* 10.8*  HCT 35.1* 31.3* 33.4* 31.0*  MCV 99.4 98.7 97.1 98.4  PLT 115* 106* 128* 979*   Basic Metabolic Panel: Recent Labs  Lab 11/07/18 0417 11/08/18 0538 11/09/18 0358 11/10/18 0621 11/11/18 0458  NA 136 135 137 134* 134*  K 3.8 3.9 3.4* 4.6 3.6  CL 100 99 96* 91* 89*  CO2 30 30 31  33* 35*  GLUCOSE 112* 102* 108* 107* 95  BUN 19 17 16 15 20   CREATININE 0.93 0.78 0.85 0.99 1.09*  CALCIUM 8.0* 8.0* 8.1* 8.3* 8.6*  MG  --   --   --  1.5* 1.9   GFR: Estimated Creatinine Clearance: 34.9 mL/min (A) (by C-G formula based on SCr of 1.09 mg/dL (H)). Liver Function Tests: Recent Labs  Lab 11/04/18 1550 11/06/18 0809 11/09/18 0358  AST 102* 98* 105*  ALT 36 34 36  ALKPHOS 229* 199* 186*  BILITOT 4.0* 4.1* 3.2*  PROT 5.1* 5.0* 4.7*  ALBUMIN 2.1* 2.0* 2.0*   No results for input(s): LIPASE, AMYLASE in the last 168 hours. No results for input(s): AMMONIA in the last 168 hours. Coagulation Profile: No results for input(s): INR, PROTIME in the last 168 hours. Cardiac Enzymes: No results for input(s): CKTOTAL, CKMB, CKMBINDEX, TROPONINI in the last 168  hours. BNP (last 3 results) No results for input(s): PROBNP in the last 8760 hours. HbA1C: No results for input(s): HGBA1C in the last 72 hours. CBG: No results for input(s): GLUCAP in the last 168 hours. Lipid Profile: No results for input(s): CHOL, HDL, LDLCALC, TRIG, CHOLHDL, LDLDIRECT in the last 72 hours. Thyroid Function Tests: Recent Labs    11/09/18 0404  TSH 3.018   Anemia Panel: No results for input(s): VITAMINB12, FOLATE, FERRITIN, TIBC, IRON, RETICCTPCT in the last 72 hours. Urine analysis:    Component Value Date/Time   COLORURINE YELLOW 11/04/2018 2011   APPEARANCEUR CLEAR 11/04/2018 2011   LABSPEC 1.005 11/04/2018 2011   PHURINE 6.0 11/04/2018 2011   GLUCOSEU NEGATIVE 11/04/2018 2011   HGBUR NEGATIVE 11/04/2018 2011   HGBUR negative 02/01/2010 [place] 11/04/2018 2011   BILIRUBINUR neg 01/11/2011 [place] 11/04/2018 2011   PROTEINUR NEGATIVE 11/04/2018 2011   UROBILINOGEN 0.2 01/11/2011 1449   UROBILINOGEN 0.2 02/01/2010 1012   NITRITE NEGATIVE 11/04/2018 2011   LEUKOCYTESUR TRACE (A) 11/04/2018 2011   No results found for this or any previous visit (from the past 240 hour(s)).    Radiology Studies: No results found.  Scheduled Meds: . aspirin EC  81 mg Oral q morning - 10a  . clotrimazole   Topical BID  . dextromethorphan-guaiFENesin  1 tablet Oral BID  . enoxaparin (LOVENOX) injection  40 mg Subcutaneous Q24H  . feeding supplement (ENSURE ENLIVE)  237 mL Oral BID BM  . furosemide  80 mg Intravenous BID  . potassium chloride  20 mEq Oral Once  . spironolactone  25 mg Oral Daily   Continuous Infusions:    LOS: 4 days   Time spent: 25 minutes.  Emelee Rodocker  Jeannette How, MD [facility] [email or web address] Password [facility] 11/11/2018, 3:02 PM

## 2018-11-11 NOTE — Progress Notes (Signed)
Physical Therapy Treatment Patient Details Name: Karen Richard MRN: [id number] DOB: [birth year] Today's Date: 11/11/2018    History of Present Illness Patient is a 85 y/o female who presents from PCP office secondary to BLE edema and Left hand swelling. Admitted with CHF exacerbation. PMH includes breast ca s/p bil mastectomy, OA, questionable NASH, HLD.     PT Comments    Pt reports feeling weaker today form diuresis. She is agreeable to RW usage for session, requires minA for standing from chair, with slight elevation can perform with RW and minGuard assist: VC given for education on safe RW use during transfers. AMB progressed to >117f. Pt with urinary incontinence in session, assisted with clean socks and undergarment.     Follow Up Recommendations  Home health PT;Supervision for mobility/OOB     Equipment Recommendations  None recommended by PT    Recommendations for Other Services       Precautions / Restrictions Precautions Precautions: Fall Precaution Comments: urinary incontinence  Restrictions Weight Bearing Restrictions: No    Mobility  Bed Mobility Overal bed mobility: (up in chair upon entry)                Transfers Overall transfer level: Needs assistance   Transfers: Sit to/from Stand Sit to Stand: Min assist         General transfer comment: Lift ssist from chair x2, but with pillow, able to perform with max effort and minguard assist m3x  Ambulation/Gait Ambulation/Gait assistance: Min guard Gait Distance (Feet): 180 Feet Assistive device: Rolling walker (2 wheeled) Gait Pattern/deviations: Step-through pattern;Decreased stride length Gait velocity: 0.244m        Stairs             Wheelchair Mobility    Modified Rankin (Stroke Patients Only)       Balance Overall balance assessment: Needs assistance Sitting-balance support: Feet supported Sitting balance-Leahy Scale: Good     Standing balance support: Single  extremity supported;During functional activity Standing balance-Leahy Scale: Fair                              Cognition Arousal/Alertness: Awake/alert Behavior During Therapy: WFL for tasks assessed/performed Overall Cognitive Status: Within Functional Limits for tasks assessed                                        Exercises      General Comments        Pertinent Vitals/Pain Pain Assessment: No/denies pain    Home Living                      Prior Function            PT Goals (current goals can now be found in the care plan section) Acute Rehab PT Goals Patient Stated Goal: to get the fluid off of her  PT Goal Formulation: With patient Time For Goal Achievement: 11/19/18 Potential to Achieve Goals: Good Progress towards PT goals: Progressing toward goals    Frequency    Min 3X/week      PT Plan Current plan remains appropriate    Co-evaluation              AM-PAC PT "6 Clicks" Mobility   Outcome Measure  Help needed turning from your back to your side while in  a flat bed without using bedrails?: A Little Help needed moving from lying on your back to sitting on the side of a flat bed without using bedrails?: A Little Help needed moving to and from a bed to a chair (including a wheelchair)?: A Little Help needed standing up from a chair using your arms (e.g., wheelchair or bedside chair)?: A Little Help needed to walk in hospital room?: A Little Help needed climbing 3-5 steps with a railing? : A Little 6 Click Score: 18    End of Session Equipment Utilized During Treatment: Gait belt Activity Tolerance: Patient limited by fatigue Patient left: in chair;with call bell/phone within reach;with nursing/sitter in room Nurse Communication: Mobility status PT Visit Diagnosis: Difficulty in walking, not elsewhere classified (R26.2);Unsteadiness on feet (R26.81) Pain - part of body: (back)     Time: 2620-3559 PT  Time Calculation (min) (ACUTE ONLY): 28 min  Charges:  $Therapeutic Activity: 23-37 mins                     2:23 PM, 11/11/18 Etta Grandchild, PT, DPT Physical Therapist - [place] [phone] (Pager)  [phone] (Office)     Hagop Mccollam C 11/11/2018, 2:20 PM

## 2018-11-11 NOTE — Plan of Care (Signed)
  Problem: Education: Goal: Ability to demonstrate management of disease process will improve Outcome: Progressing Goal: Ability to verbalize understanding of medication therapies will improve Outcome: Progressing Goal: Individualized Educational Video(s) Outcome: Progressing   Problem: Cardiac: Goal: Ability to achieve and maintain adequate cardiopulmonary perfusion will improve Outcome: Progressing   Problem: Education: Goal: Knowledge of General Education information will improve Description Including pain rating scale, medication(s)/side effects and non-pharmacologic comfort measures Outcome: Progressing   Problem: Health Behavior/Discharge Planning: Goal: Ability to manage health-related needs will improve Outcome: Progressing   Problem: Clinical Measurements: Goal: Ability to maintain clinical measurements within normal limits will improve Outcome: Progressing Goal: Will remain free from infection Outcome: Progressing Goal: Diagnostic test results will improve Outcome: Progressing Goal: Respiratory complications will improve Outcome: Progressing Goal: Cardiovascular complication will be avoided Outcome: Progressing   Problem: Activity: Goal: Risk for activity intolerance will decrease Outcome: Progressing   Problem: Nutrition: Goal: Adequate nutrition will be maintained Outcome: Progressing   Problem: Elimination: Goal: Will not experience complications related to bowel motility Outcome: Progressing Goal: Will not experience complications related to urinary retention Outcome: Progressing   Problem: Pain Managment: Goal: General experience of comfort will improve Outcome: Progressing   Problem: Safety: Goal: Ability to remain free from injury will improve Outcome: Progressing   Problem: Skin Integrity: Goal: Risk for impaired skin integrity will decrease Outcome: Progressing

## 2018-11-11 NOTE — Plan of Care (Signed)
  Problem: Education: Goal: Ability to demonstrate management of disease process will improve Outcome: Progressing Goal: Ability to verbalize understanding of medication therapies will improve Outcome: Progressing   Problem: Education: Goal: Knowledge of General Education information will improve Description Including pain rating scale, medication(s)/side effects and non-pharmacologic comfort measures Outcome: Progressing   Problem: Health Behavior/Discharge Planning: Goal: Ability to manage health-related needs will improve Outcome: Progressing   Problem: Clinical Measurements: Goal: Will remain free from infection Outcome: Progressing Goal: Diagnostic test results will improve Outcome: Progressing Goal: Respiratory complications will improve Outcome: Progressing Goal: Cardiovascular complication will be avoided Outcome: Progressing   Problem: Activity: Goal: Risk for activity intolerance will decrease Outcome: Progressing   Problem: Nutrition: Goal: Adequate nutrition will be maintained Outcome: Progressing   Problem: Pain Managment: Goal: General experience of comfort will improve Outcome: Progressing   Problem: Safety: Goal: Ability to remain free from injury will improve Outcome: Progressing   Problem: Skin Integrity: Goal: Risk for impaired skin integrity will decrease Outcome: Progressing

## 2018-11-11 NOTE — Progress Notes (Addendum)
Advanced Heart Failure Rounding Note   Subjective:    Remains on 80 mg IV lasix BID. Weight down 6 more lbs (18 lbs total). Creatinine stable 1.09. Mag improved 1.9. K 3.6. SBP 110-130s.  Ab u/s with progressive hepatic cirrhosis. Moderate ascites. Started on spiro.   Denies SOB or orthopnea. Has not gotten OOB. Her husband is still concerned that she has more fluid on board. She has requested that UNNA boots be removed.  Objective:   Weight Range:  Vital Signs:   Temp:  [97.7 F (36.5 C)-98.8 F (37.1 C)] 98.8 F (37.1 C) (01/13 0500) Pulse Rate:  [73-90] 88 (01/13 0500) Resp:  [18-20] 20 (01/13 0500) BP: (110-134)/(42-88) 110/88 (01/13 0500) SpO2:  [92 %-99 %] 99 % (01/13 0500) Weight:  [82.7 kg] 82.7 kg (01/13 0500) Last BM Date: 11/09/18  Weight change: Filed Weights   11/09/18 0312 11/10/18 0609 11/11/18 0500  Weight: 88.5 kg 85.3 kg 82.7 kg    Intake/Output:   Intake/Output Summary (Last 24 hours) at 11/11/2018 0827 Last data filed at 11/11/2018 0511 Gross per 24 hour  Intake 555.8 ml  Output 2801 ml  Net -2245.2 ml     Physical Exam: General: No resp difficulty. HEENT: Normal Neck: Supple. JVP to jaw. Carotids 2+ bilat; no bruits. No thyromegaly or nodule noted. Cor: PMI nondisplaced. RRR, No M/G/R noted Lungs: fine crackles in bases.  Abdomen: Soft, non-tender, non-distended, no HSM. No bruits or masses. +BS  Extremities: No cyanosis, clubbing, or rash. R and LLE 2+ edema.  Neuro: Alert & orientedx3, cranial nerves grossly intact. moves all 4 extremities w/o difficulty. Affect pleasant  Telemetry: NSR 90-100s with IVCD. Personally reviewed.    Labs: Basic Metabolic Panel: Recent Labs  Lab 11/07/18 0417 11/08/18 0538 11/09/18 0358 11/10/18 0621 11/11/18 0458  NA 136 135 137 134* 134*  K 3.8 3.9 3.4* 4.6 3.6  CL 100 99 96* 91* 89*  CO2 30 30 31  33* 35*  GLUCOSE 112* 102* 108* 107* 95  BUN 19 17 16 15 20   CREATININE 0.93 0.78 0.85 0.99  1.09*  CALCIUM 8.0* 8.0* 8.1* 8.3* 8.6*  MG  --   --   --  1.5* 1.9    Liver Function Tests: Recent Labs  Lab 11/04/18 1550 11/06/18 0809 11/09/18 0358  AST 102* 98* 105*  ALT 36 34 36  ALKPHOS 229* 199* 186*  BILITOT 4.0* 4.1* 3.2*  PROT 5.1* 5.0* 4.7*  ALBUMIN 2.1* 2.0* 2.0*   No results for input(s): LIPASE, AMYLASE in the last 168 hours. No results for input(s): AMMONIA in the last 168 hours.  CBC: Recent Labs  Lab 11/04/18 1550 11/05/18 0522 11/06/18 0809 11/09/18 0358  WBC 18.5* 14.0* 18.2* 17.1*  NEUTROABS 12.4*  --   --  4.6  HGB 11.7* 11.0* 11.4* 10.8*  HCT 35.1* 31.3* 33.4* 31.0*  MCV 99.4 98.7 97.1 98.4  PLT 115* 106* 128* 125*    Cardiac Enzymes: No results for input(s): CKTOTAL, CKMB, CKMBINDEX, TROPONINI in the last 168 hours.  BNP: BNP (last 3 results) Recent Labs    11/04/18 1520  BNP 97.5    ProBNP (last 3 results) No results for input(s): PROBNP in the last 8760 hours.    Other results:  Imaging: US Abdomen Limited  Result Date: 11/09/2018 CLINICAL DATA:  85 year old female with ascites. Evaluate for paracentesis. EXAM: LIMITED ABDOMEN ULTRASOUND FOR ASCITES TECHNIQUE: Limited ultrasound survey for ascites was performed in all four abdominal quadrants. COMPARISON:  Abdominal ultrasound 11/09/2018 FINDINGS: Sonographic interrogation of the 4 quadrants of the abdomen demonstrates mild ascites. There is insufficient ascites for paracentesis at this time. IMPRESSION: Insufficient fluid for paracentesis. Electronically Signed   By: Jacqulynn Cadet M.D.   On: 11/09/2018 11:49     Medications:     Scheduled Medications: . aspirin EC  81 mg Oral q morning - 10a  . clotrimazole   Topical BID  . dextromethorphan-guaiFENesin  1 tablet Oral BID  . enoxaparin (LOVENOX) injection  40 mg Subcutaneous Q24H  . feeding supplement (ENSURE ENLIVE)  237 mL Oral BID BM  . furosemide  80 mg Intravenous BID  . spironolactone  25 mg Oral Daily     Infusions:   PRN Medications: LORazepam, MUSCLE RUB   Assessment:   Karen Richard is a 85 y.o. female with h/o chronic diastolic CHF, anxiety, and HLD.    Sent to [facility] 11/04/2018 with significant BLE edema and worsening DOE. CHF team consulted for further diuresis.   Plan/Discussion:    1. Acute on chronic diastolic CHF with R>L symptoms - Echo 11/05/2018 LVEF 60-65%, Trivial MR, Mild TR, PA peak pressure 48 mm Hg, and increased RVSP  - Volume status improving on 80 mg IV Lasix BID. Down 18 lbs total, but still has ~7 lbs to go.  - Continue spiro 25 mg daily.  - With R>L symptoms and ab cirrhosis TTR amyloid a possibility but given age and comorbidities will not pursue formal w/u - [place] VAS 11/04/2018 negative for LUE DVT - US VAS 10/18/18 negative for RLE DVT. Significant interstitial fluid noted.   2. Intertrigo of panniculus - On clotrimazole per primary. Improving. No change.   3. Anxiety - Per primary. No change.   4. CLL  - Followed by Dr. Marin Olp. No change.   5. Cirrhosis - Albumin 2.1 on admission. 2.0 11/06/2018. Contributing to her anasarca. Will discuss with MD.  - Total bili 4.1.  - US Abdomen 11/08/18 with progressive hepatic cirrhosis - likely contributing to volume overload. Arlyce Harman added. Can do paracentesis if ascites persists after diuresis. No change.   6. Hypokalemia - K 3.6 today. Supp.   7. Hypomagensemia. - Mag 1.5 > 1.9. Supp.   PT recommending [facility].   Length of Stay: [place], NP 11/11/2018, 8:27 AM  Advanced Heart Failure Team Pager [phone] (M-F; [place])  Please contact [facility] for night-coverage after hours (4p -7a ) and weekends on [email or web address]  Patient seen and examined with the above-signed Advanced Practice Provider and/or Housestaff. I personally reviewed laboratory data, imaging studies and relevant notes. I independently examined the patient and formulated the important aspects of the plan. I have edited the  note to reflect any of my changes or salient points. I have personally discussed the plan with the patient and/or family.  Volume status improving but still about 5 pounds up from baseline. Will continue IV lasix and metolazone one more day. Creatinine stable. Will continue to follow. Will likely switch to torsemide at the time of discharge.  Supp K and Mg. Discussed dosing with PharmD personally.  Glori Bickers, MD  12:49 PM

## 2018-11-12 DIAGNOSIS — C9110 Chronic lymphocytic leukemia of B-cell type not having achieved remission: Secondary | ICD-10-CM

## 2018-11-12 LAB — AMMONIA: Ammonia: 75 umol/L — ABNORMAL HIGH (ref 9–35)

## 2018-11-12 LAB — BASIC METABOLIC PANEL
Anion gap: 8 (ref 5–15)
BUN: 23 mg/dL (ref 8–23)
CO2: 38 mmol/L — ABNORMAL HIGH (ref 22–32)
Calcium: 8.6 mg/dL — ABNORMAL LOW (ref 8.9–10.3)
Chloride: 88 mmol/L — ABNORMAL LOW (ref 98–111)
Creatinine, Ser: 1.03 mg/dL — ABNORMAL HIGH (ref 0.44–1.00)
GFR calc non Af Amer: 48 mL/min — ABNORMAL LOW (ref >60)
GFR, EST AFRICAN AMERICAN: 56 mL/min — AB (ref >60)
Glucose, Bld: 108 mg/dL — ABNORMAL HIGH (ref 70–99)
Potassium: 4.1 mmol/L (ref 3.5–5.1)
Sodium: 134 mmol/L — ABNORMAL LOW (ref 135–145)

## 2018-11-12 LAB — MAGNESIUM: Magnesium: 1.9 mg/dL (ref 1.7–2.4)

## 2018-11-12 MED ORDER — SPIRONOLACTONE 25 MG PO TABS: 25.0000 mg | ORAL_TABLET | Freq: Every day | ORAL | 0 refills | Status: DC

## 2018-11-12 MED ORDER — METOLAZONE 2.5 MG PO TABS
2.5000 mg | ORAL_TABLET | Freq: Once | ORAL | Status: AC
  Administered 2018-11-12: 2.5 mg via ORAL
  Filled 2018-11-12: qty 1

## 2018-11-12 MED ORDER — TORSEMIDE 20 MG PO TABS: 20.0000 mg | ORAL_TABLET | Freq: Every day | ORAL | Status: DC

## 2018-11-12 MED ORDER — FUROSEMIDE 10 MG/ML IJ SOLN
80.0000 mg | Freq: Three times a day (TID) | INTRAMUSCULAR | Status: DC
  Administered 2018-11-12: 80 mg via INTRAVENOUS

## 2018-11-12 MED ORDER — CLOTRIMAZOLE 1 % EX CREA: TOPICAL_CREAM | Freq: Two times a day (BID) | CUTANEOUS | 0 refills | Status: DC

## 2018-11-12 MED ORDER — TORSEMIDE 20 MG PO TABS: 40.0000 mg | ORAL_TABLET | Freq: Every day | ORAL | Status: DC

## 2018-11-12 MED ORDER — LORAZEPAM 0.5 MG PO TABS: 0.5000 mg | ORAL_TABLET | Freq: Four times a day (QID) | ORAL | Status: DC | PRN

## 2018-11-12 MED ORDER — POTASSIUM CHLORIDE CRYS ER 20 MEQ PO TBCR: 20.0000 meq | EXTENDED_RELEASE_TABLET | Freq: Every day | ORAL | 0 refills | Status: DC

## 2018-11-12 MED ORDER — TORSEMIDE 20 MG PO TABS: ORAL_TABLET | ORAL | 0 refills | Status: DC

## 2018-11-12 MED ORDER — MAGNESIUM SULFATE 2 GM/50ML IV SOLN
2.0000 g | Freq: Once | INTRAVENOUS | Status: AC
  Administered 2018-11-12: 2 g via INTRAVENOUS
  Filled 2018-11-12: qty 50

## 2018-11-12 NOTE — Progress Notes (Signed)
Physical Therapy Treatment Patient Details Name: Karen Richard MRN: [id number] DOB: October 06, 1927 Today's Date: 11/12/2018    History of Present Illness Pt is an 85 y.o. female admitted from PCP office on 11/04/18 secondary to BLE edema and L hand swelling; worked up for CHF exacerbation. Abdominal ultrasound with progressive hepatic cirrhosis. LLE (-) for DVT. PMH includes breast CA (s/p bilateral mastectomy), OA, HLD.   PT Comments    Pt able to practice sit<>stand transfers with and without RW, min guard for balance, but no physical assist required. Increased time spent discussing fall risk reduction, use of RW and safety upon return home. When discussing d/c, pt becoming quiet and tearful, eventually saying, "I'm not going home, I'll be going straight to [place] [funeral home]." Apparently pt has shared this with husband and other staff members since yesterday. Pt not interested in talking with anyone else about this, including a chaplain. RN aware.    Follow Up Recommendations  Home health PT;Supervision for mobility/OOB     Equipment Recommendations  None recommended by PT    Recommendations for Other Services       Precautions / Restrictions Precautions Precautions: Fall Precaution Comments: urinary incontinence  Restrictions Weight Bearing Restrictions: No    Mobility  Bed Mobility               General bed mobility comments: Seated in recliner  Transfers Overall transfer level: Needs assistance Equipment used: None;Rolling walker (2 wheeled) Transfers: Sit to/from Stand Sit to Stand: Min guard         General transfer comment: Stood 3x from recliner with min guard; pulled on RW first trial, able to perform 2x more trials with UE support on arm rests without DME  Ambulation/Gait                 Stairs             Wheelchair Mobility    Modified Rankin (Stroke Patients Only)       Balance Overall balance assessment: Needs  assistance Sitting-balance support: Feet supported Sitting balance-Leahy Scale: Good     Standing balance support: Single extremity supported;During functional activity Standing balance-Leahy Scale: Fair Standing balance comment: Can static stand without UE support                            Cognition Arousal/Alertness: Awake/alert Behavior During Therapy: Flat affect Overall Cognitive Status: No family/caregiver present to determine baseline cognitive functioning Area of Impairment: Safety/judgement                         Safety/Judgement: Decreased awareness of deficits;Decreased awareness of safety     General Comments: When asked about going home today, pt tearful and stated, "I'm not going home, I'm going to [facility]" (referring to a funeral home in [place], in that she is going to die before she leaves the hospital)- pt has apparently shared this same statement with husband and multiple staff members      Exercises      General Comments        Pertinent Vitals/Pain Pain Assessment: Faces Faces Pain Scale: Hurts a little bit Pain Location: Back (chronic) Pain Descriptors / Indicators: Discomfort;Constant Pain Intervention(s): Limited activity within patient's tolerance;Monitored during session    Home Living  Prior Function            PT Goals (current goals can now be found in the care plan section) Acute Rehab PT Goals Patient Stated Goal: "I'm not going home, I'm going straight to [facility] (funeral home)" PT Goal Formulation: With patient Time For Goal Achievement: 11/19/18 Progress towards PT goals: Progressing toward goals    Frequency    Min 3X/week      PT Plan Current plan remains appropriate    Co-evaluation              AM-PAC PT "6 Clicks" Mobility   Outcome Measure  Help needed turning from your back to your side while in a flat bed without using bedrails?: A  Little Help needed moving from lying on your back to sitting on the side of a flat bed without using bedrails?: A Little Help needed moving to and from a bed to a chair (including a wheelchair)?: A Little Help needed standing up from a chair using your arms (e.g., wheelchair or bedside chair)?: A Little Help needed to walk in hospital room?: A Little Help needed climbing 3-5 steps with a railing? : A Little 6 Click Score: 18    End of Session   Activity Tolerance: Other (comment) Patient left: in chair;with call bell/phone within reach Nurse Communication: Mobility status PT Visit Diagnosis: Difficulty in walking, not elsewhere classified (R26.2);Unsteadiness on feet (R26.81)     Time: 8325-4982 PT Time Calculation (min) (ACUTE ONLY): 20 min  Charges:  $Self Care/Home Management: [place], PT, DPT Acute Rehabilitation Services  Pager [phone] Office [phone]  Derry Lory 11/12/2018, 12:17 PM

## 2018-11-12 NOTE — Care Management Important Message (Signed)
Important Message  Patient Details  Name: Karen Richard MRN: [id number] Date of Birth: 12/23/1926   Medicare Important Message Given:  Yes    Barb Merino Cyruss Arata 11/12/2018, 5:11 PM

## 2018-11-12 NOTE — Progress Notes (Signed)
Pt discharge instructions reviewed with pt and pt's husband. Pt and husband verbalize understanding and state they have no questions. Pt belongings with pt. Pt is not in distress. Pt discharged via wheelchair. Pt's husband is driving her home.

## 2018-11-12 NOTE — Progress Notes (Signed)
Pt and husband request [facility] for [facility]. RN notified CM. CM states she will set it up.

## 2018-11-12 NOTE — Discharge Summary (Signed)
Physician Discharge Summary  Karen Richard FWY:[id number] DOB: 21-Mar-1927 DOA: 11/04/2018  PCP: Caren Macadam, MD  Admit date: 11/04/2018 Discharge date: 11/12/2018  Admitted From: ALF Disposition: ALF   Recommendations for Outpatient Follow-up:  1. Follow up with PCP in 1-2 weeks with attention to poorly healing lesion on right anterior leg, may require biopsy. 2. Follow up with the heart failure clinic.  3. Please obtain BMP, magnesium at follow up 4. Consider psychiatry follow up if emotional lability/delirium continues  Home Health: PT, OT Equipment/Devices: None new, has rolling walker Discharge Condition: Stable CODE STATUS: DNR Diet recommendation: Heart healthy  Brief/Interim Summary: Karen Richard is an 85 y.o. female with a history of NASH cirrhosis, chronic diastolic CHF, breast CA s/p bilateral mastectomy remotely, CLL under supervision, venous stasis among others who presented to the ED from her PCP's office 11/04/2018 with increasing swelling in the legs and left arm associated with ~25lbs weight gain over several months. Work up as outpatient including venous U/S without evidence of DVT, vascular surgery recommended elevation and exercise in swimming pool for graded compression. She also had lower back pain attributed to compression fracture which has made her more sedentary. She was admitted and found to be volume overloaded, started on aggressive diuresis with cardiology consultation  Discharge Diagnoses:  Principal Problem:   Acute exacerbation of CHF (congestive heart failure) ([facility]) Active Problems:   Cirrhosis of liver without ascites (HCC)   Edema of upper extremity   Intertrigo   Leukocytosis   Hypoalbuminemia   Elevated LFTs   Anxiety   Chronic back pain   CHF exacerbation (HCC)   Ascites  Volume overload/anasarca: With lower extremity edema, some abdominal edema and distention, upper extremity involvement. Multifactorial with cirrhosis/hypoalbuminemia  (albumin 2.0), chronic venous stasis as evidenced by dermatitis, lymphedema, and heart failure. LE dopplers ruled out DVT at [facility] office, no DVT on LUE U/S either. No protein on UA. TSH wnl. - Agree with unna's boots, placed 1/10, though pt requested removal 1/12. Discussed that these would likely help, though no LE wounds that are poorly healing, so will treat with diuresis as below.  Acute on chronic diastolic CHF: BNP not impressively elevated, symptoms are predominantly peripheral, though echo shows normal RV function, albeit pulm HTN, and normal caliber IVC. - From 200lbs now down to 182lbs with presumed baseline being ~174. Widening BUN:Cr and contraction alkalosis noted.  - Appreciate cardiology recommendations. Dr. Haroldine Laws feels the patient is intravascularly depleted which I agree with. Will give her body time to reequilibrate. Discharge on torsemide 50m qAM 269mqPM. Started spironolactone. Needs close volume, BMP monitoring.  Hypomagnesemia: Supplemented.  NASH cirrhosis with ascites: Seen on imaging at least back to 2017. Also has splenomegaly and thrombocytopenia, chronic LFT elevation which is stable/trended downward. - U/S 1/10 showed progressive cirrhosis and moderate ascites. Paracentesis ordered, though with significant diuresis, there was insufficient ascites for tap 1/11.  Hyperkeratotic lesion: Arising from inferior border of scar s/p right TKA. By report this has not healed for months.  - This needs to be biopsied. Discussed with family and they will follow up with PCP.   Intertrigo of panniculus - Continue topical clotrimazole and keep the area as dry as possible, improving.  CLL with chronic leukocytosis: Stage A, under q6 month surveillance by Dr. EnMarin OlpAfebrile with no symptoms of UTI or pneumonia. Note small pyuria on UA. Doubt intertrigo is causing leukocytosis.  - Monitor off antibiotics. - Follow up with Dr. EnMarin Olper routine  Anemia of chronic  disease: Hgb stable around baseline. Iron studies 2017 showed elevated ferritin, iron, %sat. - Monitor, doubt this is causing edema.  Anxiety - Continue home ativan 0.5 mg daily PRN - Exacerbated significantly while inpatient. Felt she was "going to [facility]" from the hospital. Tearful at times, also felt to have had acute withdrawn delirium (as below). Will anticipate improvement with discharge.  Chronic back pain: - Prefer to limit sedating medications at her age and avoid tylenol if able with cirrhosis.  History of breast CA: s/p bilateral mastectomy 1999.   Hypokalemia:  - Supplement especially with ongoing diuresis and monitor.  Acute withdrawn delirium: Most likely explanation for change in mentation/decreased conversation with husband and sparse speech. No focal deficits noted.  - Continue to monitor, discussed supportive care with husband today. Should be improved with discharge.  Discharge Instructions Discharge Instructions    (HEART FAILURE PATIENTS) Call MD:  Anytime you have any of the following symptoms: 1) 3 pound weight gain in 24 hours or 5 pounds in 1 week 2) shortness of breath, with or without a dry hacking cough 3) swelling in the hands, feet or stomach 4) if you have to sleep on extra pillows at night in order to breathe.   Complete by:  As directed    Call MD for:  persistant dizziness or light-headedness   Complete by:  As directed    Call MD for:  severe uncontrolled pain   Complete by:  As directed    Diet - low sodium heart healthy   Complete by:  As directed    Discharge instructions   Complete by:  As directed    You will need to change from lasix to torsemide. Instead of taking lasix, you have been prescribed torsemide 39m (2 tablets) in the morning and 215m(1 tablets) in the afternoon. Spironolactone and potassium have also been prescribed to help decrease the amount of fluid you have.  - Follow up very closely with your primary doctor and the  heart failure clinic.  - Weight yourself everyday and record that weight, take this record to your doctors appointments - Continue to pursue orthopedic work up for chronic back pain. Recommend follow up in the next week. - Elevate legs when seated, and preferably they should be kept in compression stockings. - You will receive home health physical and occupational therapy, arranging prior to discharge.  - Follow up with your primary doctor to evaluate the scab/poorly healing lesion on the right leg.  - If you experience weight gain, shortness of breath, or other new symptoms, seek medical attention right away.     Increase activity slowly   Complete by:  As directed      Allergies as of 11/12/2018      Reactions   Codeine Nausea Only   Darvocet [propoxyphene N-acetaminophen] Nausea Only      Medication List    STOP taking these medications   furosemide 20 MG tablet Commonly known as:  LASIX     TAKE these medications   amoxicillin 500 MG tablet Commonly known as:  AMOXIL Take 2,000 mg by mouth See admin instructions. Take 200043m hour before dental appointments   aspirin EC 81 MG EC tablet Generic drug:  aspirin Take 81 mg by mouth every morning.   clotrimazole 1 % cream Commonly known as:  LOTRIMIN Apply topically 2 (two) times daily.   LORazepam 0.5 MG tablet Commonly known as:  ATIVAN TAKE 1 TABLET  BY MOUTH EVERY DAY AS NEEDED FOR ANXIETY What changed:  See the new instructions.   potassium chloride SA 20 MEQ tablet Commonly known as:  K-DUR,KLOR-CON Take 1 tablet (20 mEq total) by mouth daily.   spironolactone 25 MG tablet Commonly known as:  ALDACTONE Take 1 tablet (25 mg total) by mouth daily. Start taking on:  November 13, 2018   torsemide 20 MG tablet Commonly known as:  DEMADEX Take 2 tablets (40 mg total) by mouth every morning AND 1 tablet (20 mg total) every evening.   TYLENOL ARTHRITIS EXT RELIEF PO Take 1 tablet by mouth as needed (pain).    Vitamin D (Ergocalciferol) 1.25 MG (50000 UT) Caps capsule Commonly known as:  DRISDOL TAKE 1 CAPSULE ONCE A WEEK What changed:  See the new instructions.      Follow-up Information    [facility] Follow up.   Specialty:  Home Health Services Why:  Home Physical Therapy Contact information: [address] [place] [address] [phone]        Caren Macadam, MD. Go on 11/15/2018.   Specialty:  Family Medicine Why:  @11 :45am Contact information: [place] [address] [phone]        Bensimhon, Shaune Pascal, MD. Call.   Specialty:  Cardiology Why:  if you don't get contacted for appointment Contact information: [place] [address] [phone]          Allergies  Allergen Reactions  . Codeine Nausea Only  . Darvocet [Propoxyphene N-Acetaminophen] Nausea Only    Consultations:  Cardiology  Procedures/Studies: Dg Chest 2 View  Result Date: 11/04/2018 CLINICAL DATA:  Excessive lower extremity edema. EXAM: CHEST - 2 VIEW COMPARISON:  09/05/2017 FINDINGS: AP and lateral views of the chest show cardiomegaly with low volumes. There is pulmonary vascular congestion without overt pulmonary edema. Tiny bilateral pleural effusions noted posteriorly. Subsegmental atelectasis or scarring evident left midlung. The visualized bony structures of the thorax are intact. Telemetry leads overlie the chest. IMPRESSION: Cardiomegaly with vascular congestion and tiny bilateral pleural effusions. Electronically Signed   By: Misty Stanley M.D.   On: 11/04/2018 16:14   Mr Lumbar Spine Wo Contrast  Result Date: 11/02/2018 CLINICAL DATA:  The patient suffered a low back injury lifting her husband after he had had surgery approximately 2 months ago. Continued back pain. The patient also has right hip and lower extremity pain. Initial encounter. EXAM: MRI LUMBAR SPINE WITHOUT CONTRAST TECHNIQUE: Multiplanar, multisequence  MR imaging of the lumbar spine was performed. No intravenous contrast was administered. COMPARISON:  Plain films lumbar spine 09/26/2017. FINDINGS: Segmentation:  Standard. Alignment:  0.3 cm anterolisthesis L3 on L4 is unchanged. Vertebrae: The patient has superior endplate compression fractures of T12, L1, L2 and L3. There is marrow edema in L1 and L3 consistent with acute or subacute injuries. Edema is more intense in L3. Vertebral body height loss at L1 is estimated at 15% centrally and approximately 50% centrally and anteriorly at L3. Remote T12 and L2 compression fractures demonstrate approximately 40% vertebral body height loss centrally at T12 and 50% vertebral body height loss anteriorly at L2. All of the patient's fractures have an appearance most consistent with senile osteoporotic injuries rather than pathologic fractures. Conus medullaris and cauda equina: Conus extends to the L1-2 level. Conus and cauda equina appear normal. Paraspinal and other soft tissues: Negative. Disc levels: The T11-12 is imaged in the sagittal plane only. There is  mild bony retropulsion off the superior endplate of W29 and a shallow disc bulge. The ventral thecal sac appears effaced. The foramina appear open. T12-L1: Minimal retropulsion off the superior endplate of L1. No stenosis. L1-2: Mild-to-moderate facet degenerative change and ligamentum flavum thickening. There is a shallow disc bulge and some bony retropulsion off the superior endplate of L2. Moderate central canal stenosis and mild to moderate bilateral foraminal narrowing is seen. L2-3: There is a shallow right lateral recess and foraminal protrusion and mild bony retropulsion off the superior endplate. Ligamentum flavum thickening is seen. There is moderate central canal stenosis and mild to moderate bilateral foraminal narrowing. L3-4: Shallow right paracentral protrusion extends into the foramen. There is mild narrowing in the right lateral recess and mild  bilateral foraminal narrowing. L4-5: Shallow broad-based central protrusion causes mild narrowing in the left lateral recess. Mild bilateral foraminal narrowing is present. There is marked loss of disc space height. L5-S1: Loss of disc space height and a shallow broad-based left paracentral and lateral recess protrusion. There is mild narrowing in the left lateral recess and foramen. The right foramen is open. IMPRESSION: The examination is positive for acute or subacute compression fractures of L1 and L3 with marrow edema in the vertebral bodies, more intense in L3. Vertebral body height loss centrally at L1 is estimated at up to 15% and up to approximately 50% centrally and anteriorly in L3. The fractures have an appearance most consistent with senile osteoporotic injuries. Remote T12 and L2 compression fractures. Moderate central canal stenosis and mild to moderate bilateral foraminal narrowing at L1-2 where there is a shallow disc bulge and mild bony retropulsion off the superior endplate of L2. Moderate central canal stenosis and mild to moderate bilateral foraminal narrowing at L2-3 where there is mild bony retropulsion off the superior endplate of L3 and a shallow right lateral recess and foraminal protrusion. Mild lateral recess and foraminal narrowing at L3-4, L4-5 and L5-S1 as described above. Electronically Signed   By: Inge Rise M.D.   On: 11/02/2018 09:56   US Abdomen Limited  Result Date: 11/09/2018 CLINICAL DATA:  85 year old female with ascites. Evaluate for paracentesis. EXAM: LIMITED ABDOMEN ULTRASOUND FOR ASCITES TECHNIQUE: Limited ultrasound survey for ascites was performed in all four abdominal quadrants. COMPARISON:  Abdominal ultrasound 11/09/2018 FINDINGS: Sonographic interrogation of the 4 quadrants of the abdomen demonstrates mild ascites. There is insufficient ascites for paracentesis at this time. IMPRESSION: Insufficient fluid for paracentesis. Electronically Signed   By:  Jacqulynn Cadet M.D.   On: 11/09/2018 11:49   Vas [place] Lower Extremity Venous Reflux  Result Date: 10/18/2018  Lower Venous Reflux Study Indications: Edema.  Performing Technologist: Ralene Cork RVT  Examination Guidelines: A complete evaluation includes B-mode imaging, spectral Doppler, color Doppler, and power Doppler as needed of all accessible portions of each vessel. Bilateral testing is considered an integral part of a complete examination. Limited examinations for reoccurring indications may be performed as noted. The reflux portion of the exam is performed with the patient in reverse Trendelenburg.  Vein Diameters: +------------------------------+----------+---------+                               Right (cm)Left (cm) +------------------------------+----------+---------+ GSV at Saphenofemoral junction0.865               +------------------------------+----------+---------+ GSV at prox thigh             0.667               +------------------------------+----------+---------+  GSV at mid thigh              0.546               +------------------------------+----------+---------+ GSV at distal thigh           0.523               +------------------------------+----------+---------+ GSV at knee                   0.502               +------------------------------+----------+---------+ GSV prox calf                 0.37                +------------------------------+----------+---------+ SSV origin                    NV                  +------------------------------+----------+---------+ SSV prox                      NV                  +------------------------------+----------+---------+ SSV mid                       NV                  +------------------------------+----------+---------+   Summary: Right: There is no evidence of deep vein thrombosis in the lower extremity.There is no evidence of superficial venous thrombosis.There is no evidence of chronic  venous insufficiency. Severe interstitial fluid in the right calf.  *See table(s) above for measurements and observations. Electronically signed by Servando Snare MD on 10/18/2018 at 2:42:57 PM.    Final    Ue Venous Duplex ([facility] Only)  Result Date: 11/05/2018 UPPER VENOUS STUDY  Indications: Edema Performing Technologist: Toma Copier RVS  Examination Guidelines: A complete evaluation includes B-mode imaging, spectral Doppler, color Doppler, and power Doppler as needed of all accessible portions of each vessel. Bilateral testing is considered an integral part of a complete examination. Limited examinations for reoccurring indications may be performed as noted.  Right Findings: +----------+------------+----------+---------+-----------+-------+ RIGHT     CompressiblePropertiesPhasicitySpontaneousSummary +----------+------------+----------+---------+-----------+-------+ Subclavian                         Yes       Yes            +----------+------------+----------+---------+-----------+-------+  Left Findings: +----------+------------+----------+---------+-----------+-------+ LEFT      CompressiblePropertiesPhasicitySpontaneousSummary +----------+------------+----------+---------+-----------+-------+ IJV           Full                 Yes       Yes            +----------+------------+----------+---------+-----------+-------+ Subclavian                         Yes       Yes            +----------+------------+----------+---------+-----------+-------+ Axillary      Full                 Yes       Yes            +----------+------------+----------+---------+-----------+-------+ Brachial  Full                 Yes       Yes            +----------+------------+----------+---------+-----------+-------+ Radial        Full                                          +----------+------------+----------+---------+-----------+-------+ Ulnar         Full                                           +----------+------------+----------+---------+-----------+-------+ Cephalic      Full                                          +----------+------------+----------+---------+-----------+-------+ Basilic       Full                 Yes       Yes            +----------+------------+----------+---------+-----------+-------+  Summary:  Right: No evidence of thrombosis in the subclavian.  Left: No evidence of deep vein thrombosis in the upper extremity. No evidence of superficial vein thrombosis in the upper extremity. No evidence of thrombosis in the subclavian.  *See table(s) above for measurements and observations.  Diagnosing physician: Sherren Mocha MD Electronically signed by Sherren Mocha MD on 11/05/2018 at 8:09:37 PM.    Final    US Abdomen Limited Ruq  Result Date: 11/09/2018 CLINICAL DATA:  Cirrhosis of the liver.  Abdominal distension. EXAM: ULTRASOUND ABDOMEN LIMITED RIGHT UPPER QUADRANT COMPARISON:  Abdominal ultrasound 06/06/2016. CT of the abdomen pelvis 02/08/2016. FINDINGS: Gallbladder: Cholecystectomy. Common bile duct: Diameter: 7.4 mm, within normal limits for age. Liver: The liver is shrunken with a nodular border. There is diffuse increased echotexture. No discrete lesions are present. Portal vein is patent on color Doppler imaging with normal direction of blood flow towards the liver. Moderate abdominal ascites is evident about the liver. IMPRESSION: 1. Progressive changes of hepatic cirrhosis. 2. No discrete hepatic lesion. 3. Moderate abdominal ascites. Electronically Signed   By: San Morelle M.D.   On: 11/09/2018 07:49      Echocardiogram 11/05/2018: - Left ventricle: The cavity size was normal. Systolic function was normal. The estimated ejection fraction was in the range of 60% to 65%. Wall motion was normal; there were no regional wall motion abnormalities. - Mitral valve: There was trivial regurgitation. -  Tricuspid valve: There was mild regurgitation. - Pulmonary arteries: PA peak pressure: 48 mm Hg (S).  Impressions: - The right ventricular systolic pressure was increased consistent with moderate pulmonary hypertension. ------------------------------------------------------------------- Left ventricle: The cavity size was normal. Systolic function was normal. The estimated ejection fraction was in the range of 60% to 65%. Wall motion was normal; there were no regional wall motion abnormalities. ------------------------------------------------------------------- Aortic valve: Trileaflet; normal thickness leaflets. Mobility was not restricted. Doppler: Transvalvular velocity was within the normal range. There was no stenosis. There was no regurgitation. ------------------------------------------------------------------- Aorta: Aortic root: The aortic root was normal in size. ------------------------------------------------------------------- Mitral valve: Structurally normal valve. Mobility was not restricted. Doppler: Transvalvular velocity was within  the normal range. There was no evidence for stenosis. There was trivial regurgitation. Peak gradient (D): 7 mm Hg. ------------------------------------------------------------------- Left atrium: The atrium was normal in size. ------------------------------------------------------------------- Right ventricle: The cavity size was normal. Wall thickness was normal. Systolic function was normal. ------------------------------------------------------------------- Pulmonic valve: Structurally normal valve. Cusp separation was normal. Doppler: Transvalvular velocity was within the normal range. There was no evidence for stenosis. There was no regurgitation. ------------------------------------------------------------------- Tricuspid valve: Structurally normal valve. Doppler: Transvalvular velocity was within  the normal range. There was mild regurgitation. ------------------------------------------------------------------- Pulmonary artery: The main pulmonary artery was normal-sized. Systolic pressure was within the normal range. ------------------------------------------------------------------- Right atrium: The atrium was normal in size. ------------------------------------------------------------------- Pericardium: There was no pericardial effusion. ------------------------------------------------------------------- Systemic veins: Inferior vena cava: The vessel was normal in size.  Subjective: Feels miserable, withdrawing and not speaking very much to husband or care providers. Feels she's about to die. Urine output declined somewhat and she mostly complains of being very weak, especially when trying to get up. No chest pain or dyspnea.  Discharge Exam: Vitals:   11/12/18 0613 11/12/18 0948  BP: (!) 122/42 (!) 129/40  Pulse: 83   Resp: 18   Temp: 98.2 F (36.8 C)   SpO2: 91%    General: Elderly female laying flat/supine in no distress Cardiovascular: RRR, S1/S2 +, no rubs, no gallops Respiratory: CTA bilaterally, no wheezing, no rhonchi Abdominal: Soft, NT, ND, bowel sounds + Extremities: 2+ pitting LE edema, no cyanosis Skin: Hyperkeratotis lesion on right anterior shin at inferior margin of scar is stable.  Labs: BNP (last 3 results) Recent Labs    11/04/18 1520  BNP 07.8   Basic Metabolic Panel: Recent Labs  Lab 11/08/18 0538 11/09/18 0358 11/10/18 0621 11/11/18 0458 11/12/18 0437  NA 135 137 134* 134* 134*  K 3.9 3.4* 4.6 3.6 4.1  CL 99 96* 91* 89* 88*  CO2 30 31 33* 35* 38*  GLUCOSE 102* 108* 107* 95 108*  BUN 17 16 15 20 23   CREATININE 0.78 0.85 0.99 1.09* 1.03*  CALCIUM 8.0* 8.1* 8.3* 8.6* 8.6*  MG  --   --  1.5* 1.9 1.9   Liver Function Tests: Recent Labs  Lab 11/06/18 0809 11/09/18 0358  AST 98* 105*  ALT 34 36  ALKPHOS 199* 186*   BILITOT 4.1* 3.2*  PROT 5.0* 4.7*  ALBUMIN 2.0* 2.0*   No results for input(s): LIPASE, AMYLASE in the last 168 hours. Recent Labs  Lab 11/12/18 0925  AMMONIA 75*   CBC: Recent Labs  Lab 11/06/18 0809 11/09/18 0358  WBC 18.2* 17.1*  NEUTROABS  --  4.6  HGB 11.4* 10.8*  HCT 33.4* 31.0*  MCV 97.1 98.4  PLT 128* 125*   Cardiac Enzymes: No results for input(s): CKTOTAL, CKMB, CKMBINDEX, TROPONINI in the last 168 hours. BNP: Invalid input(s): POCBNP CBG: No results for input(s): GLUCAP in the last 168 hours. D-Dimer No results for input(s): DDIMER in the last 72 hours. Hgb A1c No results for input(s): HGBA1C in the last 72 hours. Lipid Profile No results for input(s): CHOL, HDL, LDLCALC, TRIG, CHOLHDL, LDLDIRECT in the last 72 hours. Thyroid function studies No results for input(s): TSH, T4TOTAL, T3FREE, THYROIDAB in the last 72 hours.  Invalid input(s): FREET3 Anemia work up No results for input(s): VITAMINB12, FOLATE, FERRITIN, TIBC, IRON, RETICCTPCT in the last 72 hours. Urinalysis    Component Value Date/Time   COLORURINE YELLOW 11/04/2018 2011   APPEARANCEUR CLEAR 11/04/2018 2011   LABSPEC 1.005 11/04/2018 2011  PHURINE 6.0 11/04/2018 2011   GLUCOSEU NEGATIVE 11/04/2018 2011   HGBUR NEGATIVE 11/04/2018 2011   HGBUR negative 02/01/2010 [place] 11/04/2018 2011   BILIRUBINUR neg 01/11/2011 [place] 11/04/2018 2011   PROTEINUR NEGATIVE 11/04/2018 2011   UROBILINOGEN 0.2 01/11/2011 1449   UROBILINOGEN 0.2 02/01/2010 1012   NITRITE NEGATIVE 11/04/2018 2011   LEUKOCYTESUR TRACE (A) 11/04/2018 2011    Microbiology No results found for this or any previous visit (from the past 240 hour(s)).  Time coordinating discharge: Approximately 40 minutes  Patrecia Pour, MD  [facility] 11/12/2018, 11:32 AM Pager [phone]

## 2018-11-12 NOTE — Progress Notes (Addendum)
2:17 pm - Patient stated that she want [facility] for [employer] services at discharge. Referral made as requested. Mindi Slicker [facility] [phone]

## 2018-11-12 NOTE — Progress Notes (Addendum)
Advanced Heart Failure Rounding Note   Subjective:    Received 80 mg IV lasix BID yesterday + metolazone 2.5 mg. Sluggish UOP (none recorded for day shift). Weight is up 2 lbs (standing weight). Creatinine stable 1.03. SBP 110-140s  Ab u/s with progressive hepatic cirrhosis. Moderate ascites. On spiro.  Confused overnight per RN note.   She is completely alert and oriented for me this morning. Denies SOB or CP. Feels very weak. Wants to go home.   Objective:   Weight Range:  Vital Signs:   Temp:  [98.2 F (36.8 C)-98.7 F (37.1 C)] 98.2 F (36.8 C) (01/14 0613) Pulse Rate:  [83-110] 83 (01/14 0613) Resp:  [16-18] 18 (01/14 0613) BP: (122-142)/(38-47) 122/42 (01/14 0613) SpO2:  [91 %-96 %] 91 % (01/14 0613) Weight:  [83.6 kg] 83.6 kg (01/14 0613) Last BM Date: 11/10/18  Weight change: Filed Weights   11/10/18 0609 11/11/18 0500 11/12/18 0613  Weight: 85.3 kg 82.7 kg 83.6 kg    Intake/Output:   Intake/Output Summary (Last 24 hours) at 11/12/2018 0855 Last data filed at 11/12/2018 0600 Gross per 24 hour  Intake 720 ml  Output 1901 ml  Net -1181 ml     Physical Exam: General:  No resp difficulty. HEENT: Normal Neck: Supple. JVP to jaw. Carotids 2+ bilat; no bruits. No thyromegaly or nodule noted. Cor: PMI nondisplaced. RRR, No M/G/R noted Lungs: CTAB, normal effort. Abdomen: Soft, non-tender, non-distended, no HSM. No bruits or masses. +BS  Extremities: No cyanosis, clubbing, or rash. R and LLE 2+ edema.  Neuro: Alert & orientedx3, cranial nerves grossly intact. moves all 4 extremities w/o difficulty. Affect pleasant   Telemetry: NSR 80-90s with IVCD. Personally reviewed.    Labs: Basic Metabolic Panel: Recent Labs  Lab 11/08/18 0538 11/09/18 0358 11/10/18 0621 11/11/18 0458 11/12/18 0437  NA 135 137 134* 134* 134*  K 3.9 3.4* 4.6 3.6 4.1  CL 99 96* 91* 89* 88*  CO2 30 31 33* 35* 38*  GLUCOSE 102* 108* 107* 95 108*  BUN 17 16 15 20 23     CREATININE 0.78 0.85 0.99 1.09* 1.03*  CALCIUM 8.0* 8.1* 8.3* 8.6* 8.6*  MG  --   --  1.5* 1.9 1.9    Liver Function Tests: Recent Labs  Lab 11/06/18 0809 11/09/18 0358  AST 98* 105*  ALT 34 36  ALKPHOS 199* 186*  BILITOT 4.1* 3.2*  PROT 5.0* 4.7*  ALBUMIN 2.0* 2.0*   No results for input(s): LIPASE, AMYLASE in the last 168 hours. No results for input(s): AMMONIA in the last 168 hours.  CBC: Recent Labs  Lab 11/06/18 0809 11/09/18 0358  WBC 18.2* 17.1*  NEUTROABS  --  4.6  HGB 11.4* 10.8*  HCT 33.4* 31.0*  MCV 97.1 98.4  PLT 128* 125*    Cardiac Enzymes: No results for input(s): CKTOTAL, CKMB, CKMBINDEX, TROPONINI in the last 168 hours.  BNP: BNP (last 3 results) Recent Labs    11/04/18 1520  BNP 97.5    ProBNP (last 3 results) No results for input(s): PROBNP in the last 8760 hours.    Other results:  Imaging: No results found.   Medications:     Scheduled Medications: . aspirin EC  81 mg Oral q morning - 10a  . clotrimazole   Topical BID  . dextromethorphan-guaiFENesin  1 tablet Oral BID  . enoxaparin (LOVENOX) injection  40 mg Subcutaneous Q24H  . feeding supplement (ENSURE ENLIVE)  237 mL Oral BID BM  .  furosemide  80 mg Intravenous BID  . spironolactone  25 mg Oral Daily    Infusions:   PRN Medications: LORazepam, MUSCLE RUB   Assessment:   Karen Richard is a 85 y.o. female with h/o chronic diastolic CHF, anxiety, and HLD.    Sent to [facility] 11/04/2018 with significant BLE edema and worsening DOE. CHF team consulted for further diuresis.   Plan/Discussion:    1. Acute on chronic diastolic CHF with R>L symptoms - Echo 11/05/2018 LVEF 60-65%, Trivial MR, Mild TR, PA peak pressure 48 mm Hg, and increased RVSP  - Volume status remains elevated. Weight up 2 lbs overnight, down 16 lbs total. Increase lasix 80 mg IV to TID. Give another dose of metolazone today.  - Continue spiro 25 mg daily.  - With R>L symptoms and ab cirrhosis TTR  amyloid a possibility but given age and comorbidities will not pursue formal w/u - [place] VAS 11/04/2018 negative for LUE DVT - US VAS 10/18/18 negative for RLE DVT. Significant interstitial fluid noted.   2. Intertrigo of panniculus - On clotrimazole per primary. Improving. No change.   3. Anxiety - Per primary. No change.   4. CLL  - Followed by Dr. Marin Olp. No change.   5. Cirrhosis - Albumin 2.1 on admission. 2.0 11/06/2018. Contributing to her anasarca. Will discuss with MD.  - Total bili 4.1.  - US Abdomen 11/08/18 with progressive hepatic cirrhosis - likely contributing to volume overload. Arlyce Harman added. Can do paracentesis if ascites persists after diuresis. No change.   6. Hypokalemia - K 4.1 today.   7. Hypomagensemia. - Mag 1.9. Supp.   8. Confusion - Resolved. Per primary.  - Check ammonia and UA.   PT recommending [facility].   Length of Stay: [place], NP 11/12/2018, 8:55 AM  Advanced Heart Failure Team Pager [phone] (M-F; 7a - 4p)  Please contact [facility] for night-coverage after hours (4p -7a ) and weekends on [email or web address]  Patient seen and examined with the above-signed Advanced Practice Provider and/or Housestaff. I personally reviewed laboratory data, imaging studies and relevant notes. I independently examined the patient and formulated the important aspects of the plan. I have edited the note to reflect any of my changes or salient points. I have personally discussed the plan with the patient and/or family.  Volume status still up a bit on exam but no longer responding to IV lasix and metolazone. Suspect she may be a bit intravascularly dry (I.e. we have exceeded vascular refill rate). Ideally would do RHC but don;t think it would help much at her age. Her respiratory status is stable. I think she is ok to go home today. Would switch home lasix 40 bid to torsemide 40/20. She needs compression stockings at home - if she will wear. We will follow closely  in [facility].   Glori Bickers, MD  9:56 AM

## 2018-11-13 ENCOUNTER — Other Ambulatory Visit (HOSPITAL_COMMUNITY): Payer: Self-pay | Admitting: Interventional Radiology

## 2018-11-13 ENCOUNTER — Other Ambulatory Visit: Payer: Self-pay | Admitting: Physical Medicine and Rehabilitation

## 2018-11-13 ENCOUNTER — Other Ambulatory Visit (HOSPITAL_COMMUNITY): Payer: Self-pay | Admitting: Physical Medicine and Rehabilitation

## 2018-11-13 ENCOUNTER — Ambulatory Visit
Admission: RE | Admit: 2018-11-13 | Discharge: 2018-11-13 | Disposition: A | Discharge status: Home / Self Care | Payer: Self-pay | Source: Ambulatory Visit | Attending: Physical Medicine and Rehabilitation | Admitting: Physical Medicine and Rehabilitation

## 2018-11-13 DIAGNOSIS — R52 Pain, unspecified: Secondary | ICD-10-CM

## 2018-11-13 DIAGNOSIS — S32030A Wedge compression fracture of third lumbar vertebra, initial encounter for closed fracture: Secondary | ICD-10-CM

## 2018-11-13 DIAGNOSIS — S32010A Wedge compression fracture of first lumbar vertebra, initial encounter for closed fracture: Secondary | ICD-10-CM

## 2018-11-15 ENCOUNTER — Emergency Department (HOSPITAL_COMMUNITY): Payer: Medicare Other

## 2018-11-15 ENCOUNTER — Other Ambulatory Visit: Payer: Self-pay

## 2018-11-15 ENCOUNTER — Encounter (HOSPITAL_COMMUNITY): Payer: Self-pay

## 2018-11-15 ENCOUNTER — Emergency Department (HOSPITAL_COMMUNITY)
Admission: EM | Admit: 2018-11-15 | Discharge: 2018-11-15 | Disposition: A | Discharge status: Home / Self Care | Payer: Medicare Other | Attending: Emergency Medicine | Admitting: Emergency Medicine

## 2018-11-15 DIAGNOSIS — I110 Hypertensive heart disease with heart failure: Secondary | ICD-10-CM | POA: Diagnosis not present

## 2018-11-15 DIAGNOSIS — I509 Heart failure, unspecified: Secondary | ICD-10-CM | POA: Diagnosis not present

## 2018-11-15 DIAGNOSIS — R531 Weakness: Secondary | ICD-10-CM | POA: Insufficient documentation

## 2018-11-15 DIAGNOSIS — E860 Dehydration: Secondary | ICD-10-CM | POA: Insufficient documentation

## 2018-11-15 DIAGNOSIS — Z79899 Other long term (current) drug therapy: Secondary | ICD-10-CM | POA: Insufficient documentation

## 2018-11-15 DIAGNOSIS — Z7982 Long term (current) use of aspirin: Secondary | ICD-10-CM | POA: Insufficient documentation

## 2018-11-15 LAB — CBC WITH DIFFERENTIAL/PLATELET
ABS IMMATURE GRANULOCYTES: 0 10*3/uL (ref 0.00–0.07)
Basophils Absolute: 0.4 10*3/uL — ABNORMAL HIGH (ref 0.0–0.1)
Basophils Relative: 2 %
Eosinophils Absolute: 0.4 10*3/uL (ref 0.0–0.5)
Eosinophils Relative: 2 %
HCT: 37.9 % (ref 36.0–46.0)
Hemoglobin: 12.9 g/dL (ref 12.0–15.0)
LYMPHS ABS: 1.3 10*3/uL (ref 0.7–4.0)
Lymphocytes Relative: 7 %
MCH: 33.5 pg (ref 26.0–34.0)
MCHC: 34 g/dL (ref 30.0–36.0)
MCV: 98.4 fL (ref 80.0–100.0)
Monocytes Absolute: 1.8 10*3/uL — ABNORMAL HIGH (ref 0.1–1.0)
Monocytes Relative: 10 %
Neutro Abs: 14.1 10*3/uL — ABNORMAL HIGH (ref 1.7–7.7)
Neutrophils Relative %: 79 %
Platelets: DECREASED 10*3/uL (ref 150–400)
RBC: 3.85 MIL/uL — ABNORMAL LOW (ref 3.87–5.11)
RDW: 17.1 % — ABNORMAL HIGH (ref 11.5–15.5)
WBC: 17.9 10*3/uL — ABNORMAL HIGH (ref 4.0–10.5)
nRBC: 0 % (ref 0.0–0.2)

## 2018-11-15 LAB — COMPREHENSIVE METABOLIC PANEL
ALBUMIN: 2.5 g/dL — AB (ref 3.5–5.0)
ALT: 52 U/L — ABNORMAL HIGH (ref 0–44)
AST: 154 U/L — ABNORMAL HIGH (ref 15–41)
Alkaline Phosphatase: 232 U/L — ABNORMAL HIGH (ref 38–126)
Anion gap: 14 (ref 5–15)
BUN: 23 mg/dL (ref 8–23)
CO2: 35 mmol/L — ABNORMAL HIGH (ref 22–32)
Calcium: 9.2 mg/dL (ref 8.9–10.3)
Chloride: 84 mmol/L — ABNORMAL LOW (ref 98–111)
Creatinine, Ser: 1.31 mg/dL — ABNORMAL HIGH (ref 0.44–1.00)
GFR calc Af Amer: 42 mL/min — ABNORMAL LOW (ref >60)
GFR calc non Af Amer: 36 mL/min — ABNORMAL LOW (ref >60)
GLUCOSE: 119 mg/dL — AB (ref 70–99)
Potassium: 3.7 mmol/L (ref 3.5–5.1)
Sodium: 133 mmol/L — ABNORMAL LOW (ref 135–145)
Total Bilirubin: 4.6 mg/dL — ABNORMAL HIGH (ref 0.3–1.2)
Total Protein: 5.8 g/dL — ABNORMAL LOW (ref 6.5–8.1)

## 2018-11-15 LAB — I-STAT TROPONIN, ED: Troponin i, poc: 0.02 ng/mL (ref 0.00–0.08)

## 2018-11-15 LAB — PROTIME-INR
INR: 1.46
PROTHROMBIN TIME: 17.6 s — AB (ref 11.4–15.2)

## 2018-11-15 LAB — PATHOLOGIST SMEAR REVIEW

## 2018-11-15 LAB — TSH: TSH: 2.702 u[IU]/mL (ref 0.350–4.500)

## 2018-11-15 MED ORDER — SODIUM CHLORIDE 0.9 % IV BOLUS
250.0000 mL | Freq: Once | INTRAVENOUS | Status: AC
  Administered 2018-11-15: 250 mL via INTRAVENOUS

## 2018-11-15 NOTE — ED Triage Notes (Addendum)
Per pt and EMS pt reports weakness x1 month, falling twice tghis am. Staff reports to EMS she's showing signs of "failure to thrive." pt reports hitting her head no LOC, denies pain or SOB

## 2018-11-15 NOTE — ED Provider Notes (Signed)
[facility] EMERGENCY DEPARTMENT Provider Note   CSN: [id number] Arrival date & time: 11/15/18  0901     History   Chief Complaint Chief Complaint  Patient presents with  . Fall  . Weakness    HPI Karen Richard is a 85 y.o. female.  85 yo F with a chief complaint of generalized weakness.  She feels that this is gotten worse since she was discharged from the hospital.  Was just admitted for anasarca and had significant diuresis.  Since then she is been at home.  She was so weak that she collapsed twice last night.  Thinks that maybe she hit her head.  Denies other injury.  Denies cough congestion or fever denies vomiting or diarrhea.  Has been able to eat and drink without difficulty.  Denies chest pain or shortness of breath.  The history is provided by the patient.  Fall  This is a new problem. The current episode started yesterday. The problem occurs constantly. The problem has not changed since onset.Pertinent negatives include no chest pain, no abdominal pain, no headaches and no shortness of breath. Nothing aggravates the symptoms. Nothing relieves the symptoms. She has tried nothing for the symptoms. The treatment provided no relief.  Weakness  Associated symptoms: no abdominal pain, no arthralgias, no chest pain, no dizziness, no dysuria, no fever, no headaches, no myalgias, no nausea, no shortness of breath, no urgency and no vomiting     Past Medical History:  Diagnosis Date  . Anxiety   . Arthritis   . Cancer ([place])   . Cataract   . Hyperlipidemia   . Neuromuscular disorder ([place])   . Swelling in chest    rigth breast where mastectomy was performed     Patient Active Problem List   Diagnosis Date Noted  . Ascites   . CHF exacerbation ([place]) 11/07/2018  . Acute exacerbation of CHF (congestive heart failure) ([place]) 11/04/2018  . Edema of upper extremity 11/04/2018  . Intertrigo 11/04/2018  . Leukocytosis 11/04/2018  . Hypoalbuminemia  11/04/2018  . Elevated LFTs 11/04/2018  . Anxiety 11/04/2018  . Chronic back pain 11/04/2018  . Anasarca 09/05/2017  . Cirrhosis of liver without ascites ([place]) 02/09/2017  . LBBB (left bundle branch block) 03/31/2016  . Lymphedema 03/31/2016  . Peripheral edema 02/06/2013  . Back pain, thoracic 05/14/2012  . CLL (chronic lymphocytic leukemia) ([place]) 02/19/2012  . Urinary frequency 01/11/2011  . RUQ abdominal pain 01/11/2011  . ARTHRITIS, CERVICAL SPINE 06/22/2010  . DISTURBANCE OF SKIN SENSATION 06/22/2010  . HYPOGLYCEMIA, UNSPECIFIED 02/08/2010  . IRREGULAR HEART RATE 02/01/2010  . ABNORMAL INVOLUNTARY MOVEMENTS 02/01/2010  . CHEST DISCOMFORT 02/01/2010  . INSOMNIA 07/22/2009  . OTHER MALAISE AND FATIGUE 07/22/2009  . HEAT INTOLERANCE 07/22/2009  . VITAMIN D DEFICIENCY 02/11/2009  . Malignant neoplasm of female breast ([place]) 12/04/2008  . ANXIETY, SITUATIONAL 07/22/2008  . NOCTURIA 07/22/2008  . Elevated blood pressure reading without diagnosis of hypertension 07/22/2008  . SYMPTOM, COUGH 03/11/2007  . ONYCHOMYCOSIS 12/27/2006  . HYPERLIPIDEMIA 12/27/2006  . RESTLESS LEGS SYNDROME 12/27/2006  . SEBORRHEIC KERATOSIS, NOS 12/27/2006  . DJD, UNSPECIFIED 12/27/2006  . OSTEOARTHRITIS, MULTI SITES 12/27/2006    Past Surgical History:  Procedure Laterality Date  . ABDOMINAL HYSTERECTOMY    . APPENDECTOMY    . BREAST SURGERY    . CHOLECYSTECTOMY    . EYE SURGERY    . JOINT REPLACEMENT       OB History   No obstetric history  on file.      Home Medications    Prior to Admission medications   Medication Sig Start Date End Date Taking? Authorizing Provider  Acetaminophen (TYLENOL ARTHRITIS EXT RELIEF PO) Take 1 tablet by mouth as needed (pain).     [provider]  amoxicillin (AMOXIL) 500 MG tablet Take 2,000 mg by mouth See admin instructions. Take 2073m 1 hour before dental appointments    [provider]  aspirin (ASPIRIN EC) 81 MG EC tablet Take 81  mg by mouth every morning.     [provider]  clotrimazole (LOTRIMIN) 1 % cream Apply topically 2 (two) times daily. 11/12/18   GPatrecia Pour MD  LORazepam (ATIVAN) 0.5 MG tablet TAKE 1 TABLET BY MOUTH EVERY DAY AS NEEDED FOR ANXIETY Patient taking differently: Take 0.5 mg by mouth daily as needed for anxiety.  01/28/14   EVolanda Napoleon MD  potassium chloride SA (K-DUR,KLOR-CON) 20 MEQ tablet Take 1 tablet (20 mEq total) by mouth daily. 11/12/18   GPatrecia Pour MD  spironolactone (ALDACTONE) 25 MG tablet Take 1 tablet (25 mg total) by mouth daily. 11/13/18   GPatrecia Pour MD  torsemide (DEMADEX) 20 MG tablet Take 2 tablets (40 mg total) by mouth every morning AND 1 tablet (20 mg total) every evening. 11/12/18   GPatrecia Pour MD  Vitamin D, Ergocalciferol, (DRISDOL) 1.25 MG (50000 UT) CAPS capsule TAKE 1 CAPSULE ONCE A WEEK Patient taking differently: Take 50,000 Units by mouth every 7 (seven) days.  11/04/18   EVolanda Napoleon MD    Family History Family History  Problem Relation Age of Onset  . Diabetes Mother   . Heart attack Mother   . Diabetes Father   . Heart attack Father     Social History Social History   Tobacco Use  . Smoking status: Never Smoker  . Smokeless tobacco: Never Used  . Tobacco comment: never used tobacco  Substance Use Topics  . Alcohol use: No    Alcohol/week: 0.0 standard drinks  . Drug use: No     Allergies   Codeine and Darvocet [propoxyphene n-acetaminophen]   Review of Systems Review of Systems  Constitutional: Positive for fatigue. Negative for chills and fever.  HENT: Negative for congestion and rhinorrhea.   Eyes: Negative for redness and visual disturbance.  Respiratory: Negative for shortness of breath and wheezing.   Cardiovascular: Negative for chest pain and palpitations.  Gastrointestinal: Negative for abdominal pain, nausea and vomiting.  Genitourinary: Negative for dysuria and urgency.  Musculoskeletal: Negative for  arthralgias and myalgias.  Skin: Negative for pallor and wound.  Neurological: Positive for weakness (generalized). Negative for dizziness and headaches.     Physical Exam Updated Vital Signs BP (!) 128/40 (BP Location: Right Arm)   Pulse 88   Resp 16   Ht 5' 2"  (1.575 m)   SpO2 96%   BMI 33.69 kg/m   Physical Exam Vitals signs and nursing note reviewed.  Constitutional:      General: She is not in acute distress.    Appearance: She is well-developed. She is not diaphoretic.  HENT:     Head: Normocephalic and atraumatic.  Eyes:     Pupils: Pupils are equal, round, and reactive to light.  Neck:     Musculoskeletal: Normal range of motion and neck supple.  Cardiovascular:     Rate and Rhythm: Normal rate and regular rhythm.     Heart sounds: No murmur. No friction  rub. No gallop.   Pulmonary:     Effort: Pulmonary effort is normal.     Breath sounds: No wheezing or rales.  Abdominal:     General: There is no distension.     Palpations: Abdomen is soft.     Tenderness: There is no abdominal tenderness.  Musculoskeletal:        General: No tenderness.     Right lower leg: Edema present.     Left lower leg: Edema present.     Comments: 3+ edema to the bilateral lower extremities up to the thighs  Skin:    General: Skin is warm and dry.  Neurological:     Mental Status: She is alert and oriented to person, place, and time.  Psychiatric:        Behavior: Behavior normal.      ED Treatments / Results  Labs (all labs ordered are listed, but only abnormal results are displayed) Labs Reviewed  PROTIME-INR - Abnormal; Notable for the following components:      Result Value   Prothrombin Time 17.6 (*)    All other components within normal limits  CBC WITH DIFFERENTIAL/PLATELET - Abnormal; Notable for the following components:   WBC 17.9 (*)    RBC 3.85 (*)    RDW 17.1 (*)    Neutro Abs 14.1 (*)    Monocytes Absolute 1.8 (*)    Basophils Absolute 0.4 (*)    All  other components within normal limits  COMPREHENSIVE METABOLIC PANEL - Abnormal; Notable for the following components:   Sodium 133 (*)    Chloride 84 (*)    CO2 35 (*)    Glucose, Bld 119 (*)    Creatinine, Ser 1.31 (*)    Total Protein 5.8 (*)    Albumin 2.5 (*)    AST 154 (*)    ALT 52 (*)    Alkaline Phosphatase 232 (*)    Total Bilirubin 4.6 (*)    GFR calc non Af Amer 36 (*)    GFR calc Af Amer 42 (*)    All other components within normal limits  TSH  T4  PATHOLOGIST SMEAR REVIEW  I-STAT TROPONIN, ED    EKG EKG Interpretation  Date/Time:  Friday November 15 2018 09:09:39 EST Ventricular Rate:  82 PR Interval:    QRS Duration: 147 QT Interval:  473 QTC Calculation: 553 R Axis:   -11 Text Interpretation:  Sinus rhythm LAE, consider biatrial enlargement Left bundle branch block No significant change since last tracing Confirmed by Deno Etienne [phone]) on 11/15/2018 9:31:23 AM   Radiology No results found.  Procedures Procedures (including critical care time)  Medications Ordered in ED Medications  sodium chloride 0.9 % bolus 250 mL (0 mLs Intravenous Stopped 11/15/18 1050)     Initial Impression / Assessment and Plan / ED Course  I have reviewed the triage vital signs and the nursing notes.  Pertinent labs & imaging results that were available during my care of the patient were reviewed by me and considered in my medical decision making (see chart for details).     85 yo F with a chief complaint of generalized weakness.  States that they took too much fluid off her while she was in the hospital.  Thinks that her weakness is gotten worse over the past week.  We will give a small fluid bolus that she appears dehydrated.  Will obtain lab work.  Will attempt to ambulate.  Ambulates without difficulty.  Status improved significant post fluids.  Expect she is dehydrated, and deconditioned post prolonged diuresis in the hospital.  Discussed with family who has multiple  levels of care at their assisted living facility, will work on obtaining more PT.  D/c home.    I have discussed the diagnosis/risks/treatment options with the patient and family and believe the pt to be eligible for discharge home to follow-up with PCP. We also discussed returning to the ED immediately if new or worsening sx occur. We discussed the sx which are most concerning (e.g., sudden worsening pain, fever, inability to tolerate by mouth) that necessitate immediate return. Medications administered to the patient during their visit and any new prescriptions provided to the patient are listed below.  Medications given during this visit Medications  sodium chloride 0.9 % bolus 250 mL (0 mLs Intravenous Stopped 11/15/18 1050)     The patient appears reasonably screen and/or stabilized for discharge and I doubt any other medical condition or other [facility] requiring further screening, evaluation, or treatment in the ED at this time prior to discharge.    Final Clinical Impressions(s) / ED Diagnoses   Final diagnoses:  Dehydration  Weakness    ED Discharge Orders    None       Deno Etienne, DO 11/17/18 1518

## 2018-11-15 NOTE — ED Notes (Signed)
Pt dc'd home with all belongings, alert and oriented x4, driven home by spouse

## 2018-11-15 NOTE — Discharge Instructions (Signed)
Eat and drink as well as you can.  Follow up with your family doc.

## 2018-11-15 NOTE — ED Notes (Signed)
Patient given a sandwich and a drink

## 2018-11-15 NOTE — ED Notes (Signed)
Pt transported to xray 

## 2018-11-15 NOTE — ED Notes (Signed)
Patient tolerated sandwich and drink well.

## 2018-11-15 NOTE — ED Notes (Signed)
Patient ambulated 130 feet.  Tolerated well

## 2018-11-15 NOTE — ED Notes (Signed)
ED Provider at bedside. 

## 2018-11-16 LAB — T4: T4, Total: 7.1 ug/dL (ref 4.5–12.0)

## 2018-11-19 ENCOUNTER — Emergency Department (HOSPITAL_COMMUNITY): Payer: Medicare Other

## 2018-11-19 ENCOUNTER — Inpatient Hospital Stay (HOSPITAL_COMMUNITY)
Admission: EM | Admit: 2018-11-19 | Discharge: 2018-11-21 | DRG: 441 | Disposition: A | Discharge status: Home Health | Payer: Medicare Other | Attending: Family Medicine | Admitting: Family Medicine

## 2018-11-19 ENCOUNTER — Encounter (HOSPITAL_COMMUNITY): Payer: Self-pay | Admitting: Internal Medicine

## 2018-11-19 DIAGNOSIS — R4182 Altered mental status, unspecified: Secondary | ICD-10-CM | POA: Diagnosis not present

## 2018-11-19 DIAGNOSIS — G9341 Metabolic encephalopathy: Secondary | ICD-10-CM | POA: Diagnosis present

## 2018-11-19 DIAGNOSIS — Z7982 Long term (current) use of aspirin: Secondary | ICD-10-CM

## 2018-11-19 DIAGNOSIS — D6959 Other secondary thrombocytopenia: Secondary | ICD-10-CM | POA: Diagnosis present

## 2018-11-19 DIAGNOSIS — I890 Lymphedema, not elsewhere classified: Secondary | ICD-10-CM

## 2018-11-19 DIAGNOSIS — I878 Other specified disorders of veins: Secondary | ICD-10-CM | POA: Diagnosis present

## 2018-11-19 DIAGNOSIS — K5641 Fecal impaction: Secondary | ICD-10-CM | POA: Diagnosis present

## 2018-11-19 DIAGNOSIS — D72829 Elevated white blood cell count, unspecified: Secondary | ICD-10-CM

## 2018-11-19 DIAGNOSIS — K7460 Unspecified cirrhosis of liver: Secondary | ICD-10-CM | POA: Diagnosis not present

## 2018-11-19 DIAGNOSIS — K7290 Hepatic failure, unspecified without coma: Secondary | ICD-10-CM | POA: Diagnosis not present

## 2018-11-19 DIAGNOSIS — Z9660 Presence of unspecified orthopedic joint implant: Secondary | ICD-10-CM | POA: Diagnosis present

## 2018-11-19 DIAGNOSIS — K7469 Other cirrhosis of liver: Secondary | ICD-10-CM | POA: Diagnosis present

## 2018-11-19 DIAGNOSIS — I447 Left bundle-branch block, unspecified: Secondary | ICD-10-CM | POA: Diagnosis present

## 2018-11-19 DIAGNOSIS — Z856 Personal history of leukemia: Secondary | ICD-10-CM

## 2018-11-19 DIAGNOSIS — R296 Repeated falls: Secondary | ICD-10-CM | POA: Diagnosis present

## 2018-11-19 DIAGNOSIS — R402143 Coma scale, eyes open, spontaneous, at hospital admission: Secondary | ICD-10-CM | POA: Diagnosis present

## 2018-11-19 DIAGNOSIS — R402363 Coma scale, best motor response, obeys commands, at hospital admission: Secondary | ICD-10-CM | POA: Diagnosis present

## 2018-11-19 DIAGNOSIS — D638 Anemia in other chronic diseases classified elsewhere: Secondary | ICD-10-CM | POA: Diagnosis present

## 2018-11-19 DIAGNOSIS — K7581 Nonalcoholic steatohepatitis (NASH): Secondary | ICD-10-CM | POA: Diagnosis present

## 2018-11-19 DIAGNOSIS — D72828 Other elevated white blood cell count: Secondary | ICD-10-CM | POA: Diagnosis present

## 2018-11-19 DIAGNOSIS — G8929 Other chronic pain: Secondary | ICD-10-CM | POA: Diagnosis present

## 2018-11-19 DIAGNOSIS — Z66 Do not resuscitate: Secondary | ICD-10-CM | POA: Diagnosis present

## 2018-11-19 DIAGNOSIS — G9340 Encephalopathy, unspecified: Secondary | ICD-10-CM | POA: Diagnosis not present

## 2018-11-19 DIAGNOSIS — M545 Low back pain: Secondary | ICD-10-CM | POA: Diagnosis present

## 2018-11-19 DIAGNOSIS — Z853 Personal history of malignant neoplasm of breast: Secondary | ICD-10-CM

## 2018-11-19 DIAGNOSIS — R402253 Coma scale, best verbal response, oriented, at hospital admission: Secondary | ICD-10-CM | POA: Diagnosis present

## 2018-11-19 DIAGNOSIS — I272 Pulmonary hypertension, unspecified: Secondary | ICD-10-CM | POA: Diagnosis present

## 2018-11-19 DIAGNOSIS — M1990 Unspecified osteoarthritis, unspecified site: Secondary | ICD-10-CM | POA: Diagnosis present

## 2018-11-19 DIAGNOSIS — G709 Myoneural disorder, unspecified: Secondary | ICD-10-CM | POA: Diagnosis present

## 2018-11-19 DIAGNOSIS — Z885 Allergy status to narcotic agent status: Secondary | ICD-10-CM

## 2018-11-19 DIAGNOSIS — I509 Heart failure, unspecified: Secondary | ICD-10-CM | POA: Diagnosis present

## 2018-11-19 DIAGNOSIS — Z79899 Other long term (current) drug therapy: Secondary | ICD-10-CM

## 2018-11-19 DIAGNOSIS — F419 Anxiety disorder, unspecified: Secondary | ICD-10-CM | POA: Diagnosis present

## 2018-11-19 DIAGNOSIS — R7989 Other specified abnormal findings of blood chemistry: Secondary | ICD-10-CM | POA: Diagnosis present

## 2018-11-19 DIAGNOSIS — E871 Hypo-osmolality and hyponatremia: Secondary | ICD-10-CM | POA: Diagnosis present

## 2018-11-19 LAB — COMPREHENSIVE METABOLIC PANEL
ALT: 68 U/L — ABNORMAL HIGH (ref 0–44)
ANION GAP: 14 (ref 5–15)
AST: 191 U/L — AB (ref 15–41)
Albumin: 2.9 g/dL — ABNORMAL LOW (ref 3.5–5.0)
Alkaline Phosphatase: 269 U/L — ABNORMAL HIGH (ref 38–126)
BUN: 27 mg/dL — ABNORMAL HIGH (ref 8–23)
CALCIUM: 8.8 mg/dL — AB (ref 8.9–10.3)
CO2: 29 mmol/L (ref 22–32)
Chloride: 90 mmol/L — ABNORMAL LOW (ref 98–111)
Creatinine, Ser: 1.21 mg/dL — ABNORMAL HIGH (ref 0.44–1.00)
GFR calc Af Amer: 46 mL/min — ABNORMAL LOW (ref >60)
GFR calc non Af Amer: 40 mL/min — ABNORMAL LOW (ref >60)
Glucose, Bld: 136 mg/dL — ABNORMAL HIGH (ref 70–99)
Potassium: 3.8 mmol/L (ref 3.5–5.1)
Sodium: 133 mmol/L — ABNORMAL LOW (ref 135–145)
TOTAL PROTEIN: 7 g/dL (ref 6.5–8.1)
Total Bilirubin: 5.5 mg/dL — ABNORMAL HIGH (ref 0.3–1.2)

## 2018-11-19 LAB — PROTIME-INR
INR: 1.38
Prothrombin Time: 16.8 seconds — ABNORMAL HIGH (ref 11.4–15.2)

## 2018-11-19 LAB — URINALYSIS, ROUTINE W REFLEX MICROSCOPIC
Bilirubin Urine: NEGATIVE
Glucose, UA: NEGATIVE mg/dL
Hgb urine dipstick: NEGATIVE
KETONES UR: NEGATIVE mg/dL
Leukocytes, UA: NEGATIVE
Nitrite: NEGATIVE
Protein, ur: NEGATIVE mg/dL
Specific Gravity, Urine: 1.009 (ref 1.005–1.030)
pH: 7 (ref 5.0–8.0)

## 2018-11-19 LAB — CBG MONITORING, ED: Glucose-Capillary: 106 mg/dL — ABNORMAL HIGH (ref 70–99)

## 2018-11-19 LAB — CBC WITH DIFFERENTIAL/PLATELET
Abs Immature Granulocytes: 0 10*3/uL (ref 0.00–0.07)
Basophils Absolute: 1.1 10*3/uL — ABNORMAL HIGH (ref 0.0–0.1)
Basophils Relative: 4 %
Eosinophils Absolute: 0.8 10*3/uL — ABNORMAL HIGH (ref 0.0–0.5)
Eosinophils Relative: 3 %
HCT: 41 % (ref 36.0–46.0)
Hemoglobin: 14 g/dL (ref 12.0–15.0)
Lymphocytes Relative: 12 %
Lymphs Abs: 3.3 10*3/uL (ref 0.7–4.0)
MCH: 33.8 pg (ref 26.0–34.0)
MCHC: 34.1 g/dL (ref 30.0–36.0)
MCV: 99 fL (ref 80.0–100.0)
Monocytes Absolute: 1.6 10*3/uL — ABNORMAL HIGH (ref 0.1–1.0)
Monocytes Relative: 6 %
NEUTROS ABS: 20.6 10*3/uL — AB (ref 1.7–7.7)
Neutrophils Relative %: 75 %
Platelets: 143 10*3/uL — ABNORMAL LOW (ref 150–400)
RBC: 4.14 MIL/uL (ref 3.87–5.11)
RDW: 17.2 % — ABNORMAL HIGH (ref 11.5–15.5)
WBC: 27.4 10*3/uL — ABNORMAL HIGH (ref 4.0–10.5)
nRBC: 0 % (ref 0.0–0.2)

## 2018-11-19 LAB — I-STAT TROPONIN, ED: Troponin i, poc: 0.04 ng/mL (ref 0.00–0.08)

## 2018-11-19 LAB — BRAIN NATRIURETIC PEPTIDE: B Natriuretic Peptide: 52 pg/mL (ref 0.0–100.0)

## 2018-11-19 LAB — AMMONIA: Ammonia: 64 umol/L — ABNORMAL HIGH (ref 9–35)

## 2018-11-19 MED ORDER — TORSEMIDE 20 MG PO TABS
40.0000 mg | ORAL_TABLET | Freq: Every day | ORAL | Status: DC
  Administered 2018-11-20 – 2018-11-21 (×2): 40 mg via ORAL
  Filled 2018-11-19 (×2): qty 2

## 2018-11-19 MED ORDER — ONDANSETRON HCL 4 MG PO TABS: 4.0000 mg | ORAL_TABLET | Freq: Four times a day (QID) | ORAL | Status: DC | PRN

## 2018-11-19 MED ORDER — ACETAMINOPHEN 650 MG RE SUPP: 650.0000 mg | Freq: Four times a day (QID) | RECTAL | Status: DC | PRN

## 2018-11-19 MED ORDER — ONDANSETRON HCL 4 MG/2ML IJ SOLN: 4.0000 mg | Freq: Four times a day (QID) | INTRAMUSCULAR | Status: DC | PRN

## 2018-11-19 MED ORDER — ASPIRIN EC 81 MG PO TBEC
81.0000 mg | DELAYED_RELEASE_TABLET | Freq: Every morning | ORAL | Status: DC
  Administered 2018-11-20 – 2018-11-21 (×2): 81 mg via ORAL
  Filled 2018-11-19 (×2): qty 1

## 2018-11-19 MED ORDER — LACTULOSE 10 GM/15ML PO SOLN
20.0000 g | Freq: Three times a day (TID) | ORAL | Status: DC
  Administered 2018-11-20 (×3): 20 g via ORAL
  Filled 2018-11-19 (×4): qty 30

## 2018-11-19 MED ORDER — SPIRONOLACTONE 25 MG PO TABS
25.0000 mg | ORAL_TABLET | Freq: Every day | ORAL | Status: DC
  Administered 2018-11-20 – 2018-11-21 (×2): 25 mg via ORAL
  Filled 2018-11-19 (×2): qty 1

## 2018-11-19 MED ORDER — POTASSIUM CHLORIDE CRYS ER 20 MEQ PO TBCR
20.0000 meq | EXTENDED_RELEASE_TABLET | Freq: Every day | ORAL | Status: DC
  Administered 2018-11-20 – 2018-11-21 (×2): 20 meq via ORAL
  Filled 2018-11-19 (×2): qty 1

## 2018-11-19 MED ORDER — ACETAMINOPHEN 325 MG PO TABS: 650.0000 mg | ORAL_TABLET | Freq: Four times a day (QID) | ORAL | Status: DC | PRN

## 2018-11-19 MED ORDER — SODIUM CHLORIDE 0.9 % IV SOLN
1.0000 g | Freq: Every day | INTRAVENOUS | Status: DC
  Administered 2018-11-20: 1 g via INTRAVENOUS
  Filled 2018-11-19: qty 10

## 2018-11-19 MED ORDER — ENOXAPARIN SODIUM 40 MG/0.4ML ~~LOC~~ SOLN
40.0000 mg | Freq: Every day | SUBCUTANEOUS | Status: DC
  Administered 2018-11-20 – 2018-11-21 (×2): 40 mg via SUBCUTANEOUS
  Filled 2018-11-19 (×2): qty 0.4

## 2018-11-19 NOTE — ED Triage Notes (Signed)
Pt here via [facility] from doctors office with increased confusion and weakness she fell on Friday January 17th. PCP discovered pt has lost significant amount of weight since last visit and takes torsemide daily. VSS at this time.

## 2018-11-19 NOTE — ED Provider Notes (Signed)
[facility] EMERGENCY DEPARTMENT Provider Note   CSN: [id number] Arrival date & time: 11/19/18  1443     History   Chief Complaint Chief Complaint  Patient presents with  . Weakness  . Altered Mental Status    HPI Karen Richard is a 85 y.o. female.  Patient here from her PCPs office for evaluation of weight loss and confusion and weakness.  She was mated a couple of weeks ago for fluid all of preload and diuresed.  She was back here on the 17th after some falls and given some IV fluids and released home.  Patient is self is a little vague why she is here other than saying she is losing weight.  She said she is been eating okay denies any fevers or chills chest pain shortness of breath abdominal pain.  No other family is present with her at this time.  She knows she lives at [facility] and she knows the date and where she is.  Per the EMS note she is here for increased confusion and weakness.  The history is provided by the patient.  Weakness  Onset quality:  Gradual Progression:  Worsening Chronicity:  Recurrent Context: not alcohol use   Relieved by:  Nothing Worsened by:  Nothing Ineffective treatments:  None tried Associated symptoms: falls   Associated symptoms: no abdominal pain, no chest pain, no diarrhea, no dysuria, no fever, no headaches, no nausea, no shortness of breath and no vomiting   Altered Mental Status  Associated symptoms: weakness   Associated symptoms: no abdominal pain, no fever, no headaches, no nausea, no rash and no vomiting     Past Medical History:  Diagnosis Date  . Anxiety   . Arthritis   . Cancer ([place])   . Cataract   . Hyperlipidemia   . Neuromuscular disorder ([place])   . Swelling in chest    rigth breast where mastectomy was performed     Patient Active Problem List   Diagnosis Date Noted  . Ascites   . CHF exacerbation ([place]) 11/07/2018  . Acute exacerbation of CHF (congestive heart failure) ([place]) 11/04/2018  .  Edema of upper extremity 11/04/2018  . Intertrigo 11/04/2018  . Leukocytosis 11/04/2018  . Hypoalbuminemia 11/04/2018  . Elevated LFTs 11/04/2018  . Anxiety 11/04/2018  . Chronic back pain 11/04/2018  . Anasarca 09/05/2017  . Cirrhosis of liver without ascites ([place]) 02/09/2017  . LBBB (left bundle branch block) 03/31/2016  . Lymphedema 03/31/2016  . Peripheral edema 02/06/2013  . Back pain, thoracic 05/14/2012  . CLL (chronic lymphocytic leukemia) ([place]) 02/19/2012  . Urinary frequency 01/11/2011  . RUQ abdominal pain 01/11/2011  . ARTHRITIS, CERVICAL SPINE 06/22/2010  . DISTURBANCE OF SKIN SENSATION 06/22/2010  . HYPOGLYCEMIA, UNSPECIFIED 02/08/2010  . IRREGULAR HEART RATE 02/01/2010  . ABNORMAL INVOLUNTARY MOVEMENTS 02/01/2010  . CHEST DISCOMFORT 02/01/2010  . INSOMNIA 07/22/2009  . OTHER MALAISE AND FATIGUE 07/22/2009  . HEAT INTOLERANCE 07/22/2009  . VITAMIN D DEFICIENCY 02/11/2009  . Malignant neoplasm of female breast ([place]) 12/04/2008  . ANXIETY, SITUATIONAL 07/22/2008  . NOCTURIA 07/22/2008  . Elevated blood pressure reading without diagnosis of hypertension 07/22/2008  . SYMPTOM, COUGH 03/11/2007  . ONYCHOMYCOSIS 12/27/2006  . HYPERLIPIDEMIA 12/27/2006  . RESTLESS LEGS SYNDROME 12/27/2006  . SEBORRHEIC KERATOSIS, NOS 12/27/2006  . DJD, UNSPECIFIED 12/27/2006  . OSTEOARTHRITIS, MULTI SITES 12/27/2006    Past Surgical History:  Procedure Laterality Date  . ABDOMINAL HYSTERECTOMY    . APPENDECTOMY    .  BREAST SURGERY    . CHOLECYSTECTOMY    . EYE SURGERY    . JOINT REPLACEMENT       OB History   No obstetric history on file.      Home Medications    Prior to Admission medications   Medication Sig Start Date End Date Taking? Authorizing Provider  Acetaminophen (TYLENOL ARTHRITIS EXT RELIEF PO) Take 1 tablet by mouth as needed (pain).     [provider]  amoxicillin (AMOXIL) 500 MG tablet Take 2,000 mg by mouth See admin instructions. Take  2039m 1 hour before dental appointments    [provider]  aspirin (ASPIRIN EC) 81 MG EC tablet Take 81 mg by mouth every morning.     [provider]  clotrimazole (LOTRIMIN) 1 % cream Apply topically 2 (two) times daily. 11/12/18   GPatrecia Pour MD  LORazepam (ATIVAN) 0.5 MG tablet TAKE 1 TABLET BY MOUTH EVERY DAY AS NEEDED FOR ANXIETY Patient taking differently: Take 0.5 mg by mouth daily as needed for anxiety.  01/28/14   EVolanda Napoleon MD  potassium chloride SA (K-DUR,KLOR-CON) 20 MEQ tablet Take 1 tablet (20 mEq total) by mouth daily. 11/12/18   GPatrecia Pour MD  spironolactone (ALDACTONE) 25 MG tablet Take 1 tablet (25 mg total) by mouth daily. 11/13/18   GPatrecia Pour MD  torsemide (DEMADEX) 20 MG tablet Take 2 tablets (40 mg total) by mouth every morning AND 1 tablet (20 mg total) every evening. 11/12/18   GPatrecia Pour MD  Vitamin D, Ergocalciferol, (DRISDOL) 1.25 MG (50000 UT) CAPS capsule TAKE 1 CAPSULE ONCE A WEEK Patient taking differently: Take 50,000 Units by mouth every 7 (seven) days.  11/04/18   EVolanda Napoleon MD    Family History Family History  Problem Relation Age of Onset  . Diabetes Mother   . Heart attack Mother   . Diabetes Father   . Heart attack Father     Social History Social History   Tobacco Use  . Smoking status: Never Smoker  . Smokeless tobacco: Never Used  . Tobacco comment: never used tobacco  Substance Use Topics  . Alcohol use: No    Alcohol/week: 0.0 standard drinks  . Drug use: No     Allergies   Codeine and Darvocet [propoxyphene n-acetaminophen]   Review of Systems Review of Systems  Constitutional: Negative for fever.  HENT: Negative for sore throat.   Eyes: Negative for visual disturbance.  Respiratory: Negative for shortness of breath.   Cardiovascular: Negative for chest pain.  Gastrointestinal: Negative for abdominal pain, diarrhea, nausea and vomiting.  Genitourinary: Negative for dysuria.    Musculoskeletal: Positive for falls. Negative for back pain.  Skin: Negative for rash.  Neurological: Positive for weakness. Negative for headaches.     Physical Exam Updated Vital Signs BP (!) 146/61   Pulse 93   Temp 97.6 F (36.4 C) (Oral)   Ht 5' 2"  (1.575 m)   SpO2 97%   BMI 33.69 kg/m   Physical Exam Vitals signs and nursing note reviewed.  Constitutional:      General: She is not in acute distress.    Appearance: She is well-developed.  HENT:     Head: Normocephalic and atraumatic.  Eyes:     Conjunctiva/sclera: Conjunctivae normal.  Neck:     Musculoskeletal: Neck supple.  Cardiovascular:     Rate and Rhythm: Normal rate and regular rhythm.     Heart sounds: No murmur.  Pulmonary:     Effort: Pulmonary effort is normal. No respiratory distress.     Breath sounds: Normal breath sounds.  Abdominal:     Palpations: Abdomen is soft.     Tenderness: There is no abdominal tenderness.  Musculoskeletal:        General: No deformity.     Right lower leg: Edema present.     Left lower leg: Edema present.  Skin:    General: Skin is warm and dry.     Capillary Refill: Capillary refill takes less than 2 seconds.  Neurological:     General: No focal deficit present.     Mental Status: She is alert and oriented to person, place, and time.     Sensory: No sensory deficit.     Motor: No weakness.      ED Treatments / Results  Labs (all labs ordered are listed, but only abnormal results are displayed) Labs Reviewed  CBC WITH DIFFERENTIAL/PLATELET - Abnormal; Notable for the following components:      Result Value   WBC 27.4 (*)    RDW 17.2 (*)    Platelets 143 (*)    Neutro Abs 20.6 (*)    Monocytes Absolute 1.6 (*)    Eosinophils Absolute 0.8 (*)    Basophils Absolute 1.1 (*)    All other components within normal limits  COMPREHENSIVE METABOLIC PANEL - Abnormal; Notable for the following components:   Sodium 133 (*)    Chloride 90 (*)    Glucose, Bld 136  (*)    BUN 27 (*)    Creatinine, Ser 1.21 (*)    Calcium 8.8 (*)    Albumin 2.9 (*)    AST 191 (*)    ALT 68 (*)    Alkaline Phosphatase 269 (*)    Total Bilirubin 5.5 (*)    GFR calc non Af Amer 40 (*)    GFR calc Af Amer 46 (*)    All other components within normal limits  PROTIME-INR - Abnormal; Notable for the following components:   Prothrombin Time 16.8 (*)    All other components within normal limits  AMMONIA - Abnormal; Notable for the following components:   Ammonia 64 (*)    All other components within normal limits  CREATININE, SERUM - Abnormal; Notable for the following components:   Creatinine, Ser 1.20 (*)    GFR calc non Af Amer 40 (*)    GFR calc Af Amer 46 (*)    All other components within normal limits  CBC - Abnormal; Notable for the following components:   WBC 22.5 (*)    RBC 3.59 (*)    HCT 34.4 (*)    MCH 34.3 (*)    RDW 16.9 (*)    Platelets 119 (*)    All other components within normal limits  BASIC METABOLIC PANEL - Abnormal; Notable for the following components:   Sodium 133 (*)    Chloride 93 (*)    Glucose, Bld 135 (*)    BUN 25 (*)    Creatinine, Ser 1.03 (*)    Calcium 8.6 (*)    GFR calc non Af Amer 48 (*)    GFR calc Af Amer 56 (*)    All other components within normal limits  HEPATIC FUNCTION PANEL - Abnormal; Notable for the following components:   Total Protein 6.1 (*)    Albumin 2.4 (*)    AST 165 (*)    ALT 60 (*)  Alkaline Phosphatase 218 (*)    Total Bilirubin 4.8 (*)    Bilirubin, Direct 1.9 (*)    Indirect Bilirubin 2.9 (*)    All other components within normal limits  CBG MONITORING, ED - Abnormal; Notable for the following components:   Glucose-Capillary 106 (*)    All other components within normal limits  CULTURE, BLOOD (ROUTINE X 2)  CULTURE, BLOOD (ROUTINE X 2)  URINALYSIS, ROUTINE W REFLEX MICROSCOPIC  BRAIN NATRIURETIC PEPTIDE  CBC WITH DIFFERENTIAL/PLATELET  I-STAT TROPONIN, ED    EKG EKG  Interpretation  Date/Time:  Tuesday November 19 2018 14:52:23 EST Ventricular Rate:  90 PR Interval:    QRS Duration: 146 QT Interval:  450 QTC Calculation: 551 R Axis:   -47 Text Interpretation:  Sinus rhythm LAE, consider biatrial enlargement Left bundle branch block Artifact in lead(s) I III aVR aVL V2 Poor data quality in current ECG precludes serial comparison Confirmed by Aletta Edouard [phone]) on 11/19/2018 3:07:01 PM   Radiology Dg Chest 2 View  Result Date: 11/19/2018 CLINICAL DATA:  Overall weakness since yesterday. EXAM: CHEST - 2 VIEW COMPARISON:  11/15/2018 FINDINGS: The heart size and mediastinal contours are within normal limits. Nonaneurysmal atherosclerotic aorta. Both lungs are clear. The visualized skeletal structures are unremarkable. IMPRESSION: No active cardiopulmonary disease.  Aortic atherosclerosis. Electronically Signed   By: Ashley Royalty M.D.   On: 11/19/2018 16:17   Ct Head Wo Contrast  Result Date: 11/19/2018 CLINICAL DATA:  Increased confusion.  Fall 4 days ago. EXAM: CT HEAD WITHOUT CONTRAST TECHNIQUE: Contiguous axial images were obtained from the base of the skull through the vertex without intravenous contrast. COMPARISON:  11/15/2018 FINDINGS: Brain: Mild global atrophy is appropriate to age. Mild chronic ischemic changes in the periventricular white matter. There is no mass effect, midline shift, or acute hemorrhage. Vascular: No hyperdense vessel or unexpected calcification. Skull: The cranium is intact. Sinuses/Orbits: Orbits are within normal limits. Visualized paranasal sinuses and mastoid air cells are clear. Other: Noncontributory. IMPRESSION: No acute intracranial pathology. Electronically Signed   By: Marybelle Killings M.D.   On: 11/19/2018 19:53    Procedures Procedures (including critical care time)  Medications Ordered in ED Medications - No data to display   Initial Impression / Assessment and Plan / ED Course  I have reviewed the triage vital  signs and the nursing notes.  Pertinent labs & imaging results that were available during my care of the patient were reviewed by me and considered in my medical decision making (see chart for details).  Clinical Course as of Nov 20 1298  Tue Nov 19, 2018  1555 Lab work starting to come back and she has a new elevation in her white blood cell count of 27,000.  Her chemistries are mostly baseline for her with elevation in LFTs that have been attributed to her chronic cirrhosis.   [MB]  5176 Her husband is here and says she has had a 40 pound weight loss since coming in for diuresis with a fluid overload.  She was at the office today and she did not know where she was.  Notes why the PCP sent her here  He said the PCP was worried about electrolyte derangements due to the excessive diuresis.   [MB]  1957 Patient's primary care doctor is called me and states she is very worried about her going home.  She was in the office today and did no even recognize her her as her physician.  She said this  is a notable difference in her and she is recommending admission.  I let her know that she is got an increase in her white count but we have not found anything obvious to explain otherwise her fluctuating mental status.  The husband is also worried about her and does not want to take her home like this.  Paged hospitalist for admission.   [MB]    Clinical Course User Index [MB] Hayden Rasmussen, MD    Final Clinical Impressions(s) / ED Diagnoses   Final diagnoses:  Altered mental status, unspecified altered mental status type  Leukocytosis, unspecified type    ED Discharge Orders    None       Hayden Rasmussen, MD 11/20/18 1301

## 2018-11-19 NOTE — ED Notes (Signed)
Patient becoming agitated with staff she is in a hallway bed. Patient states "I had a room last night, they told me it was ready, 3 East room 38". This RN explained to the patient she has been in the emergency department the whole time tonight and we have been WAITING on a room for her. She may have been in that room when she was admitted to the hospital before. Asked patient if there was anything I could do to help make her more comfortable. Patient given another warm blanket and PO fluids.

## 2018-11-19 NOTE — H&P (Addendum)
History and Physical    Karen Richard ASN:[id number] DOB: 09/02/1927 DOA: 11/19/2018  PCP: Caren Macadam, MD  Patient coming from: Home.  Chief Complaint: Increasing confusion.  HPI: Karen Richard is a 85 y.o. female with history of cryptogenic cirrhosis, CHF pulmonary hypertension who was recently admitted for anasarca and was diuresed with net weight loss of around 20 pounds discharged last week was found to be increasingly confused.  Patient was brought to the ER 4 days ago after patient had a fall at that time patient had a CT head which was unremarkable and was discharged about a given fluid bolus.  Patient was taken to her primary care physician today for follow-up and patient was found to be confused by the primary care physician and was referred to the ER.  Per patient husband was spoke with ER physician patient has become more confused unable to recognize certain things which usually does.  Has not had any fever chills or loss of consciousness or did not complain of any chest pain abdominal pain nausea vomiting or diarrhea.  ED Course: In the ER patient appeared nonfocal CT head is unremarkable.  Patient has known history of CLL which is being observed but blood work showing worsening of the leukocytosis.  UA chest x-ray were unremarkable CT head was repeated which did not show any acute.  Ammonia level was around 64 patient admitted for acute encephalopathy likely could be from hepatic encephalopathy given history of cirrhosis.  Review of Systems: As per HPI, rest all negative.   Past Medical History:  Diagnosis Date  . Anxiety   . Arthritis   . Cancer ([place])   . Cataract   . Hyperlipidemia   . Neuromuscular disorder ([place])   . Swelling in chest    rigth breast where mastectomy was performed     Past Surgical History:  Procedure Laterality Date  . ABDOMINAL HYSTERECTOMY    . APPENDECTOMY    . BREAST SURGERY    . CHOLECYSTECTOMY    . EYE SURGERY    . JOINT  REPLACEMENT       reports that she has never smoked. She has never used smokeless tobacco. She reports that she does not drink alcohol or use drugs.  Allergies  Allergen Reactions  . Codeine Nausea Only  . Darvocet [Propoxyphene N-Acetaminophen] Nausea Only    Family History  Problem Relation Age of Onset  . Diabetes Mother   . Heart attack Mother   . Diabetes Father   . Heart attack Father     Prior to Admission medications   Medication Sig Start Date End Date Taking? Authorizing Provider  Acetaminophen (TYLENOL ARTHRITIS EXT RELIEF PO) Take 1 tablet by mouth as needed (pain).    Yes [provider]  aspirin (ASPIRIN EC) 81 MG EC tablet Take 81 mg by mouth every morning.    Yes [provider]  clotrimazole (LOTRIMIN) 1 % cream Apply topically 2 (two) times daily. 11/12/18  Yes Patrecia Pour, MD  LORazepam (ATIVAN) 0.5 MG tablet TAKE 1 TABLET BY MOUTH EVERY DAY AS NEEDED FOR ANXIETY Patient taking differently: Take 0.5 mg by mouth daily as needed for anxiety.  01/28/14  Yes Ennever, Rudell Cobb, MD  potassium chloride SA (K-DUR,KLOR-CON) 20 MEQ tablet Take 1 tablet (20 mEq total) by mouth daily. 11/12/18  Yes Patrecia Pour, MD  spironolactone (ALDACTONE) 25 MG tablet Take 1 tablet (25 mg total) by mouth daily. 11/13/18  Yes Patrecia Pour,  MD  torsemide (DEMADEX) 20 MG tablet Take 2 tablets (40 mg total) by mouth every morning AND 1 tablet (20 mg total) every evening. Patient taking differently: Take 2 tablets (40 mg total) in the morning 11/12/18  Yes Patrecia Pour, MD  Vitamin D, Ergocalciferol, (DRISDOL) 1.25 MG (50000 UT) CAPS capsule TAKE 1 CAPSULE ONCE A WEEK Patient taking differently: Take 50,000 Units by mouth every 7 (seven) days. Saturday 11/04/18  Yes Volanda Napoleon, MD    Physical Exam: Vitals:   11/19/18 1815 11/19/18 1830 11/19/18 1923 11/19/18 2109  BP: 132/68 (!) 138/116 (!) 142/60 (!) 149/36  Pulse: 99 (!) 107 (!) 101 (!) 103  Resp: (!) 22 20 18 17     Temp:    97.7 F (36.5 C)  TempSrc:    Oral  SpO2: 99% 97% 99% 93%  Height:          Constitutional: Moderately built and nourished. Vitals:   11/19/18 1815 11/19/18 1830 11/19/18 1923 11/19/18 2109  BP: 132/68 (!) 138/116 (!) 142/60 (!) 149/36  Pulse: 99 (!) 107 (!) 101 (!) 103  Resp: (!) 22 20 18 17   Temp:    97.7 F (36.5 C)  TempSrc:    Oral  SpO2: 99% 97% 99% 93%  Height:       Eyes: Anicteric no pallor. ENMT: No discharge from the ears eyes nose or mouth. Neck: No mass felt.  No neck rigidity no JVD appreciated. Respiratory: No rhonchi or crepitations. Cardiovascular: S1-S2 heard. Abdomen: Soft nontender bowel sounds present. Musculoskeletal: Chronic skin changes. Skin: Chronic skin changes. Neurologic: Alert awake oriented to her name and place.  Moves all extremities. Psychiatric: Oriented to name and place.   Labs on Admission: I have personally reviewed following labs and imaging studies  CBC: Recent Labs  Lab 11/15/18 1000 11/19/18 1501  WBC 17.9* 27.4*  NEUTROABS 14.1* 20.6*  HGB 12.9 14.0  HCT 37.9 41.0  MCV 98.4 99.0  PLT PLATELET CLUMPS NOTED ON SMEAR, COUNT APPEARS DECREASED 382*   Basic Metabolic Panel: Recent Labs  Lab 11/15/18 1000 11/19/18 1501  NA 133* 133*  K 3.7 3.8  CL 84* 90*  CO2 35* 29  GLUCOSE 119* 136*  BUN 23 27*  CREATININE 1.31* 1.21*  CALCIUM 9.2 8.8*   GFR: Estimated Creatinine Clearance: 31.6 mL/min (A) (by C-G formula based on SCr of 1.21 mg/dL (H)). Liver Function Tests: Recent Labs  Lab 11/15/18 1000 11/19/18 1501  AST 154* 191*  ALT 52* 68*  ALKPHOS 232* 269*  BILITOT 4.6* 5.5*  PROT 5.8* 7.0  ALBUMIN 2.5* 2.9*   No results for input(s): LIPASE, AMYLASE in the last 168 hours. Recent Labs  Lab 11/19/18 2055  AMMONIA 64*   Coagulation Profile: Recent Labs  Lab 11/15/18 1000 11/19/18 1501  INR 1.46 1.38   Cardiac Enzymes: No results for input(s): CKTOTAL, CKMB, CKMBINDEX, TROPONINI in the  last 168 hours. BNP (last 3 results) No results for input(s): PROBNP in the last 8760 hours. HbA1C: No results for input(s): HGBA1C in the last 72 hours. CBG: Recent Labs  Lab 11/19/18 1633  GLUCAP 106*   Lipid Profile: No results for input(s): CHOL, HDL, LDLCALC, TRIG, CHOLHDL, LDLDIRECT in the last 72 hours. Thyroid Function Tests: No results for input(s): TSH, T4TOTAL, FREET4, T3FREE, THYROIDAB in the last 72 hours. Anemia Panel: No results for input(s): VITAMINB12, FOLATE, FERRITIN, TIBC, IRON, RETICCTPCT in the last 72 hours. Urine analysis:    Component Value Date/Time  COLORURINE YELLOW 11/19/2018 [place] 11/19/2018 1851   LABSPEC 1.009 11/19/2018 1851   PHURINE 7.0 11/19/2018 1851   GLUCOSEU NEGATIVE 11/19/2018 [place] 11/19/2018 1851   HGBUR negative 02/01/2010 1012   BILIRUBINUR NEGATIVE 11/19/2018 1851   BILIRUBINUR neg 01/11/2011 [place] 11/19/2018 1851   PROTEINUR NEGATIVE 11/19/2018 1851   UROBILINOGEN 0.2 01/11/2011 1449   UROBILINOGEN 0.2 02/01/2010 1012   NITRITE NEGATIVE 11/19/2018 1851   LEUKOCYTESUR NEGATIVE 11/19/2018 1851   Sepsis Labs: @LABRCNTIP (procalcitonin:4,lacticidven:4) )No results found for this or any previous visit (from the past 240 hour(s)).   Radiological Exams on Admission: Dg Chest 2 View  Result Date: 11/19/2018 CLINICAL DATA:  Overall weakness since yesterday. EXAM: CHEST - 2 VIEW COMPARISON:  11/15/2018 FINDINGS: The heart size and mediastinal contours are within normal limits. Nonaneurysmal atherosclerotic aorta. Both lungs are clear. The visualized skeletal structures are unremarkable. IMPRESSION: No active cardiopulmonary disease.  Aortic atherosclerosis. Electronically Signed   By: Ashley Royalty M.D.   On: 11/19/2018 16:17   Ct Head Wo Contrast  Result Date: 11/19/2018 CLINICAL DATA:  Increased confusion.  Fall 4 days ago. EXAM: CT HEAD WITHOUT CONTRAST TECHNIQUE: Contiguous  axial images were obtained from the base of the skull through the vertex without intravenous contrast. COMPARISON:  11/15/2018 FINDINGS: Brain: Mild global atrophy is appropriate to age. Mild chronic ischemic changes in the periventricular white matter. There is no mass effect, midline shift, or acute hemorrhage. Vascular: No hyperdense vessel or unexpected calcification. Skull: The cranium is intact. Sinuses/Orbits: Orbits are within normal limits. Visualized paranasal sinuses and mastoid air cells are clear. Other: Noncontributory. IMPRESSION: No acute intracranial pathology. Electronically Signed   By: Marybelle Killings M.D.   On: 11/19/2018 19:53    EKG: Independently reviewed.  Normal sinus rhythm LBBB.  Assessment/Plan Principal Problem:   Acute encephalopathy Active Problems:   Lymphedema   Cirrhosis of liver without ascites (HCC)    1. Acute encephalopathy likely could be from hepatic encephalopathy with elevated ammonia level for which I have started patient on lactulose.  Patient is afebrile chest x-ray UA unremarkable CT head was unremarkable.  Patient does have elevated WBC count more than her baseline for which I have ordered blood culture and place patient empirically on ceftriaxone for now.  May consider paracentesis in the morning.  Holding off patient's Ativan for now due to encephalopathy. 2. Cirrhosis of the liver on torsemide and spironolactone.  Appears compensated. 3. History of CHF with pulmonary hypertension last 2D echo done this month was showing EF of 60 to 65%.  Not on diuretics as mentioned in #2. 4. Chronic leukocytosis with history of CLL with worsening of the white cell count.  Blood cultures have been ordered.  Empirically placed on antibiotics.  May need paracentesis.   5. Chronic thrombocytopenia likely from cirrhosis.  Follow CBC. 6. Anemia hemoglobin appears to be at baseline. 7. Recent falls will need physical therapy consult. 8. Elevated LFTs appears to be  chronic will follow.   DVT prophylaxis: Lovenox. Code Status: DNR. Family Communication: No family at the bedside. Disposition Plan: Home. Consults called: None. Admission status: Observation.   Rise Patience MD [facility] Pager [phone].  If 7PM-7AM, please contact night-coverage [email or web address] Password [facility]  11/19/2018, 11:06 PM

## 2018-11-20 ENCOUNTER — Encounter (HOSPITAL_COMMUNITY): Payer: Self-pay | Admitting: *Deleted

## 2018-11-20 ENCOUNTER — Other Ambulatory Visit: Payer: Self-pay

## 2018-11-20 DIAGNOSIS — K7581 Nonalcoholic steatohepatitis (NASH): Secondary | ICD-10-CM | POA: Diagnosis present

## 2018-11-20 DIAGNOSIS — K7469 Other cirrhosis of liver: Secondary | ICD-10-CM | POA: Diagnosis present

## 2018-11-20 DIAGNOSIS — R4182 Altered mental status, unspecified: Secondary | ICD-10-CM | POA: Diagnosis present

## 2018-11-20 DIAGNOSIS — M545 Low back pain: Secondary | ICD-10-CM | POA: Diagnosis present

## 2018-11-20 DIAGNOSIS — R296 Repeated falls: Secondary | ICD-10-CM | POA: Diagnosis present

## 2018-11-20 DIAGNOSIS — D6959 Other secondary thrombocytopenia: Secondary | ICD-10-CM | POA: Diagnosis present

## 2018-11-20 DIAGNOSIS — K7290 Hepatic failure, unspecified without coma: Secondary | ICD-10-CM | POA: Diagnosis present

## 2018-11-20 DIAGNOSIS — I509 Heart failure, unspecified: Secondary | ICD-10-CM | POA: Diagnosis present

## 2018-11-20 DIAGNOSIS — K5641 Fecal impaction: Secondary | ICD-10-CM | POA: Diagnosis present

## 2018-11-20 DIAGNOSIS — R402363 Coma scale, best motor response, obeys commands, at hospital admission: Secondary | ICD-10-CM | POA: Diagnosis present

## 2018-11-20 DIAGNOSIS — R402253 Coma scale, best verbal response, oriented, at hospital admission: Secondary | ICD-10-CM | POA: Diagnosis present

## 2018-11-20 DIAGNOSIS — I878 Other specified disorders of veins: Secondary | ICD-10-CM | POA: Diagnosis present

## 2018-11-20 DIAGNOSIS — F419 Anxiety disorder, unspecified: Secondary | ICD-10-CM | POA: Diagnosis present

## 2018-11-20 DIAGNOSIS — D72828 Other elevated white blood cell count: Secondary | ICD-10-CM | POA: Diagnosis present

## 2018-11-20 DIAGNOSIS — Z66 Do not resuscitate: Secondary | ICD-10-CM | POA: Diagnosis present

## 2018-11-20 DIAGNOSIS — I272 Pulmonary hypertension, unspecified: Secondary | ICD-10-CM | POA: Diagnosis present

## 2018-11-20 DIAGNOSIS — I890 Lymphedema, not elsewhere classified: Secondary | ICD-10-CM | POA: Diagnosis present

## 2018-11-20 DIAGNOSIS — I447 Left bundle-branch block, unspecified: Secondary | ICD-10-CM | POA: Diagnosis present

## 2018-11-20 DIAGNOSIS — G709 Myoneural disorder, unspecified: Secondary | ICD-10-CM | POA: Diagnosis present

## 2018-11-20 DIAGNOSIS — G9341 Metabolic encephalopathy: Secondary | ICD-10-CM | POA: Diagnosis present

## 2018-11-20 DIAGNOSIS — D638 Anemia in other chronic diseases classified elsewhere: Secondary | ICD-10-CM | POA: Diagnosis present

## 2018-11-20 DIAGNOSIS — G9340 Encephalopathy, unspecified: Secondary | ICD-10-CM | POA: Diagnosis not present

## 2018-11-20 DIAGNOSIS — R402143 Coma scale, eyes open, spontaneous, at hospital admission: Secondary | ICD-10-CM | POA: Diagnosis present

## 2018-11-20 DIAGNOSIS — K7460 Unspecified cirrhosis of liver: Secondary | ICD-10-CM | POA: Diagnosis not present

## 2018-11-20 DIAGNOSIS — G8929 Other chronic pain: Secondary | ICD-10-CM | POA: Diagnosis present

## 2018-11-20 DIAGNOSIS — E871 Hypo-osmolality and hyponatremia: Secondary | ICD-10-CM | POA: Diagnosis present

## 2018-11-20 DIAGNOSIS — R7989 Other specified abnormal findings of blood chemistry: Secondary | ICD-10-CM | POA: Diagnosis present

## 2018-11-20 LAB — CBC
HCT: 34.4 % — ABNORMAL LOW (ref 36.0–46.0)
Hemoglobin: 12.3 g/dL (ref 12.0–15.0)
MCH: 34.3 pg — ABNORMAL HIGH (ref 26.0–34.0)
MCHC: 35.8 g/dL (ref 30.0–36.0)
MCV: 95.8 fL (ref 80.0–100.0)
Platelets: 119 10*3/uL — ABNORMAL LOW (ref 150–400)
RBC: 3.59 MIL/uL — ABNORMAL LOW (ref 3.87–5.11)
RDW: 16.9 % — ABNORMAL HIGH (ref 11.5–15.5)
WBC: 22.5 10*3/uL — ABNORMAL HIGH (ref 4.0–10.5)
nRBC: 0 % (ref 0.0–0.2)

## 2018-11-20 LAB — BASIC METABOLIC PANEL
Anion gap: 11 (ref 5–15)
BUN: 25 mg/dL — ABNORMAL HIGH (ref 8–23)
CALCIUM: 8.6 mg/dL — AB (ref 8.9–10.3)
CO2: 29 mmol/L (ref 22–32)
CREATININE: 1.03 mg/dL — AB (ref 0.44–1.00)
Chloride: 93 mmol/L — ABNORMAL LOW (ref 98–111)
GFR calc Af Amer: 56 mL/min — ABNORMAL LOW (ref >60)
GFR calc non Af Amer: 48 mL/min — ABNORMAL LOW (ref >60)
Glucose, Bld: 135 mg/dL — ABNORMAL HIGH (ref 70–99)
Potassium: 3.8 mmol/L (ref 3.5–5.1)
Sodium: 133 mmol/L — ABNORMAL LOW (ref 135–145)

## 2018-11-20 LAB — HEPATIC FUNCTION PANEL
ALT: 60 U/L — ABNORMAL HIGH (ref 0–44)
AST: 165 U/L — ABNORMAL HIGH (ref 15–41)
Albumin: 2.4 g/dL — ABNORMAL LOW (ref 3.5–5.0)
Alkaline Phosphatase: 218 U/L — ABNORMAL HIGH (ref 38–126)
Bilirubin, Direct: 1.9 mg/dL — ABNORMAL HIGH (ref 0.0–0.2)
Indirect Bilirubin: 2.9 mg/dL — ABNORMAL HIGH (ref 0.3–0.9)
Total Bilirubin: 4.8 mg/dL — ABNORMAL HIGH (ref 0.3–1.2)
Total Protein: 6.1 g/dL — ABNORMAL LOW (ref 6.5–8.1)

## 2018-11-20 LAB — CREATININE, SERUM
Creatinine, Ser: 1.2 mg/dL — ABNORMAL HIGH (ref 0.44–1.00)
GFR calc non Af Amer: 40 mL/min — ABNORMAL LOW (ref >60)
GFR, EST AFRICAN AMERICAN: 46 mL/min — AB (ref >60)

## 2018-11-20 MED ORDER — BOOST / RESOURCE BREEZE PO LIQD CUSTOM
1.0000 | Freq: Three times a day (TID) | ORAL | Status: DC
  Administered 2018-11-20 (×2): 1 via ORAL

## 2018-11-20 MED ORDER — BISACODYL 10 MG RE SUPP
10.0000 mg | Freq: Once | RECTAL | Status: AC
  Administered 2018-11-20: 10 mg via RECTAL
  Filled 2018-11-20: qty 1

## 2018-11-20 MED ORDER — FLEET ENEMA 7-19 GM/118ML RE ENEM: 1.0000 | ENEMA | Freq: Once | RECTAL | Status: DC

## 2018-11-20 NOTE — Plan of Care (Signed)
Pt is alert and oriented X4. Denies any pain at this time. Able to communicate needs. Skin warm and dry. No distress noted at this time. Will continue to monitor

## 2018-11-20 NOTE — Evaluation (Signed)
Physical Therapy Evaluation Patient Details Name: Karen Richard MRN: [id number] DOB: January 14, 1927 Today's Date: 11/20/2018   History of Present Illness  Karen Richard is an 85 y.o. female with history of cryptogenic cirrhosis, CHF pulmonary hypertension who was recently admitted for anasarca and was diuresed with net weight loss of around 20 pounds discharged last week was found to be increasingly confused.  Patient was brought to the ER 4 days ago after patient had a fall at that time patient had a CT head which was unremarkable and was discharged after given fluid bolus.  Patient was taken to her primary care physician today for follow-up and patient was found to be confused by the primary care physician and was referred to the ER.   Clinical Impression  Pt admitted with above diagnosis. Pt currently with functional limitations due to the deficits listed below (see PT Problem List). Pt was able to ambulate with HHA with min assist for safety with unsteady gait.  Pt did much better with rW and has one she can use at home.  Pt states husband always there. Will need to use RW at all times and receive HHPT and HHOT.   Pt will benefit from skilled PT to increase their independence and safety with mobility to allow discharge to the venue listed below.      Follow Up Recommendations Home health PT;Supervision/Assistance - 24 hour, HHOT    Equipment Recommendations  None recommended by PT    Recommendations for Other Services       Precautions / Restrictions Precautions Precautions: Fall Restrictions Weight Bearing Restrictions: No      Mobility  Bed Mobility Overal bed mobility: Needs Assistance Bed Mobility: Supine to Sit;Sit to Supine Rolling: Supervision     Sit to supine: Supervision      Transfers Overall transfer level: Needs assistance Equipment used: None;Rolling walker (2 wheeled) Transfers: Sit to/from Stand Sit to Stand: Min guard         General transfer  comment: Pt was on 3N1 on arrival. Assisted pt to stand to be cleaned.  Pt cleaned herself with washcloths.  Able to keep balance statically.   Ambulation/Gait Ambulation/Gait assistance: Min guard;Min assist Gait Distance (Feet): 35 Feet Assistive device: None;Rolling walker (2 wheeled) Gait Pattern/deviations: Step-through pattern;Decreased stride length   Gait velocity interpretation: <1.31 ft/sec, indicative of household ambulator General Gait Details: Slow, waddle type gait. Pt with increased fatigue, which limited gait distance. Unsteady and required min A for steadying with HHA. Walked with RW and did much better with it than holding hands or furniture.   Stairs            Wheelchair Mobility    Modified Rankin (Stroke Patients Only)       Balance Overall balance assessment: Needs assistance Sitting-balance support: No upper extremity supported;Feet supported Sitting balance-Leahy Scale: Fair     Standing balance support: Bilateral upper extremity supported;During functional activity;Single extremity supported Standing balance-Leahy Scale: Poor Standing balance comment: relies on at least 1 UE support.                              Pertinent Vitals/Pain Pain Assessment: No/denies pain    Home Living Family/patient expects to be discharged to:: Private residence([place]) Living Arrangements: Spouse/significant other Available Help at Discharge: Family;Available 24 hours/day Type of Home: Independent living facility Home Access: [place]: One level Home Equipment: Gilford Rile -  2 wheels;Kasandra Knudsen - single point Additional Comments: Lives in main building (apartment-style) at [facility]    Prior Function Level of Independence: Needs assistance         Comments: Furniture walker at baseline per report. DOes not drive anymore- plans on giving it up. Reports no falls. Walks to [employer] for some meals, cooks  some.     Hand Dominance        Extremity/Trunk Assessment   Upper Extremity Assessment Upper Extremity Assessment: Defer to OT evaluation    Lower Extremity Assessment Lower Extremity Assessment: Generalized weakness    Cervical / Trunk Assessment Cervical / Trunk Assessment: Kyphotic  Communication   Communication: HOH  Cognition Arousal/Alertness: Awake/alert Behavior During Therapy: Flat affect Overall Cognitive Status: Impaired/Different from baseline Area of Impairment: Safety/judgement;Orientation                         Safety/Judgement: Decreased awareness of deficits;Decreased awareness of safety     General Comments: Pt oriented x 4.       General Comments      Exercises General Exercises - Lower Extremity Ankle Circles/Pumps: AROM;Both;10 reps;Supine Quad Sets: AROM;Both;5 reps;Supine   Assessment/Plan    PT Assessment Patient needs continued PT services  PT Problem List Decreased strength;Decreased balance;Decreased skin integrity;Decreased safety awareness;Decreased mobility;Decreased knowledge of use of DME;Decreased activity tolerance       PT Treatment Interventions DME instruction;Functional mobility training;Balance training;Patient/family education;Gait training;Therapeutic activities;Therapeutic exercise    PT Goals (Current goals can be found in the Care Plan section)  Acute Rehab PT Goals Patient Stated Goal: To go home PT Goal Formulation: With patient Time For Goal Achievement: 12/04/18 Potential to Achieve Goals: Good    Frequency Min 3X/week   Barriers to discharge        Co-evaluation               AM-PAC PT "6 Clicks" Mobility  Outcome Measure Help needed turning from your back to your side while in a flat bed without using bedrails?: None Help needed moving from lying on your back to sitting on the side of a flat bed without using bedrails?: None Help needed moving to and from a bed to a chair  (including a wheelchair)?: A Little Help needed standing up from a chair using your arms (e.g., wheelchair or bedside chair)?: A Little Help needed to walk in hospital room?: A Little Help needed climbing 3-5 steps with a railing? : A Little 6 Click Score: 20    End of Session Equipment Utilized During Treatment: Gait belt Activity Tolerance: Patient limited by fatigue Patient left: in bed;with call bell/phone within reach;with bed alarm set Nurse Communication: Mobility status PT Visit Diagnosis: Difficulty in walking, not elsewhere classified (R26.2);Unsteadiness on feet (R26.81)    Time: 7129-2909 PT Time Calculation (min) (ACUTE ONLY): 15 min   Charges:   PT Evaluation $PT Eval Moderate Complexity: [place] Pager:  [phone]  Office:  [phone]    Denice Paradise 11/20/2018, 1:11 PM

## 2018-11-20 NOTE — Progress Notes (Addendum)
Progress Note    Karen Richard  DDU:[id number] DOB: 06/13/1927  DOA: 11/19/2018 PCP: Caren Macadam, MD    Brief Narrative:     Medical records reviewed and are as summarized below:  Karen Richard is an 85 y.o. female with history of cryptogenic cirrhosis, CHF pulmonary hypertension who was recently admitted for anasarca and was diuresed with net weight loss of around 20 pounds discharged last week was found to be increasingly confused.  Patient was brought to the ER 4 days ago after patient had a fall at that time patient had a CT head which was unremarkable and was discharged after given fluid bolus.  Patient was taken to her primary care physician today for follow-up and patient was found to be confused by the primary care physician and was referred to the ER.   Assessment/Plan:   Principal Problem:   Acute encephalopathy Active Problems:   Lymphedema   Cirrhosis of liver without ascites (HCC)  Acute encephalopathy likely could be from hepatic encephalopathy with elevated ammonia level  -lactulose -monitor- has been hospitalized/ER visit 3 times in last few weeks -still situationally confused- awake but thinks she never left the hospital from last admission- not able to explain why she is here and what her health issues are  ? Fecal impaction -suppository and disimpaction if needed  Cryptogenic Cirrhosis of the liver -torsemide and spironolactone  History of CHF with pulmonary hypertension last 2D echo done this month was showing EF of 60 to 65%. -on above diuretics  Chronic leukocytosis with history of CLL -worsening  white cell count   -Blood cultures have been ordered -monitor for fever -d/c abx for now    Chronic thrombocytopenia  -likely from cirrhosis  Anemia (unknown etiology) -trend Hgb  Hyponatremia -monitor  Recent falls  -physical therapy consult.  Elevated LFTs  -trend but appear to be chronic  This is the 3rd ER  visit/hospitalization in last 1 month.  Patient is still not back to baseline.  No sign of infection, liver worsening, needs further work up and management in the hospital.    Family Communication/Anticipated D/C date and plan/Code Status   DVT prophylaxis: Lovenox ordered. Code Status: dnr Family Communication:  Disposition Plan:    Medical Consultants:    None.    Subjective:   Feels a pressure in rectum (did her own maunally disimpaction this AM) Also feels weak  Objective:    Vitals:   11/19/18 2350 11/20/18 0000 11/20/18 0449 11/20/18 0831  BP:  (!) 154/56 (!) 148/59 (!) 170/59  Pulse:  (!) 103 (!) 102 (!) 104  Resp:  18 18 17   Temp:  98 F (36.7 C) 97.9 F (36.6 C) 97.8 F (36.6 C)  TempSrc:  Oral Oral Oral  SpO2:  94% 94% 95%  Weight: 72.3 kg  71.9 kg   Height: 5' 3"  (1.6 m)       Intake/Output Summary (Last 24 hours) at 11/20/2018 0944 Last data filed at 11/20/2018 2376 Gross per 24 hour  Intake 100 ml  Output 250 ml  Net -150 ml   Filed Weights   11/19/18 2350 11/20/18 0449  Weight: 72.3 kg 71.9 kg    Exam: In bed, frail appearing Alert but not appropriate with situation- thinks she never left the hospital from last admission (lives in ILF normally) Regular and fast Diminished breath sounds, no wheezing +edema in LE but non-pitting (large legs compared to body)  Data Reviewed:   I have personally  reviewed following labs and imaging studies:  Labs: Labs show the following:   Basic Metabolic Panel: Recent Labs  Lab 11/15/18 1000 11/19/18 1501 11/19/18 2349 11/20/18 0300  NA 133* 133*  --  133*  K 3.7 3.8  --  3.8  CL 84* 90*  --  93*  CO2 35* 29  --  29  GLUCOSE 119* 136*  --  135*  BUN 23 27*  --  25*  CREATININE 1.31* 1.21* 1.20* 1.03*  CALCIUM 9.2 8.8*  --  8.6*   GFR Estimated Creatinine Clearance: 35.2 mL/min (A) (by C-G formula based on SCr of 1.03 mg/dL (H)). Liver Function Tests: Recent Labs  Lab 11/15/18 1000  11/19/18 1501 11/20/18 0300  AST 154* 191* 165*  ALT 52* 68* 60*  ALKPHOS 232* 269* 218*  BILITOT 4.6* 5.5* 4.8*  PROT 5.8* 7.0 6.1*  ALBUMIN 2.5* 2.9* 2.4*   No results for input(s): LIPASE, AMYLASE in the last 168 hours. Recent Labs  Lab 11/19/18 2055  AMMONIA 64*   Coagulation profile Recent Labs  Lab 11/15/18 1000 11/19/18 1501  INR 1.46 1.38    CBC: Recent Labs  Lab 11/15/18 1000 11/19/18 1501 11/20/18 0047  WBC 17.9* 27.4* 22.5*  NEUTROABS 14.1* 20.6*  --   HGB 12.9 14.0 12.3  HCT 37.9 41.0 34.4*  MCV 98.4 99.0 95.8  PLT PLATELET CLUMPS NOTED ON SMEAR, COUNT APPEARS DECREASED 143* 119*   Cardiac Enzymes: No results for input(s): CKTOTAL, CKMB, CKMBINDEX, TROPONINI in the last 168 hours. BNP (last 3 results) No results for input(s): PROBNP in the last 8760 hours. CBG: Recent Labs  Lab 11/19/18 1633  GLUCAP 106*   D-Dimer: No results for input(s): DDIMER in the last 72 hours. Hgb A1c: No results for input(s): HGBA1C in the last 72 hours. Lipid Profile: No results for input(s): CHOL, HDL, LDLCALC, TRIG, CHOLHDL, LDLDIRECT in the last 72 hours. Thyroid function studies: No results for input(s): TSH, T4TOTAL, T3FREE, THYROIDAB in the last 72 hours.  Invalid input(s): FREET3 Anemia work up: No results for input(s): VITAMINB12, FOLATE, FERRITIN, TIBC, IRON, RETICCTPCT in the last 72 hours. Sepsis Labs: Recent Labs  Lab 11/15/18 1000 11/19/18 1501 11/20/18 0047  WBC 17.9* 27.4* 22.5*    Microbiology No results found for this or any previous visit (from the past 240 hour(s)).  Procedures and diagnostic studies:  Dg Chest 2 View  Result Date: 11/19/2018 CLINICAL DATA:  Overall weakness since yesterday. EXAM: CHEST - 2 VIEW COMPARISON:  11/15/2018 FINDINGS: The heart size and mediastinal contours are within normal limits. Nonaneurysmal atherosclerotic aorta. Both lungs are clear. The visualized skeletal structures are unremarkable. IMPRESSION: No  active cardiopulmonary disease.  Aortic atherosclerosis. Electronically Signed   By: Ashley Royalty M.D.   On: 11/19/2018 16:17   Ct Head Wo Contrast  Result Date: 11/19/2018 CLINICAL DATA:  Increased confusion.  Fall 4 days ago. EXAM: CT HEAD WITHOUT CONTRAST TECHNIQUE: Contiguous axial images were obtained from the base of the skull through the vertex without intravenous contrast. COMPARISON:  11/15/2018 FINDINGS: Brain: Mild global atrophy is appropriate to age. Mild chronic ischemic changes in the periventricular white matter. There is no mass effect, midline shift, or acute hemorrhage. Vascular: No hyperdense vessel or unexpected calcification. Skull: The cranium is intact. Sinuses/Orbits: Orbits are within normal limits. Visualized paranasal sinuses and mastoid air cells are clear. Other: Noncontributory. IMPRESSION: No acute intracranial pathology. Electronically Signed   By: Marybelle Richard M.D.   On: 11/19/2018 19:53  Medications:   . aspirin EC  81 mg Oral q morning - 10a  . enoxaparin (LOVENOX) injection  40 mg Subcutaneous Daily  . lactulose  20 g Oral TID  . potassium chloride SA  20 mEq Oral Daily  . spironolactone  25 mg Oral Daily  . torsemide  40 mg Oral Daily   Continuous Infusions: . cefTRIAXone (ROCEPHIN)  IV 1 g (11/20/18 0206)     LOS: 0 days   Geradine Girt  [facility]   *Please refer to [email or web address], password [email or web address] to get updated schedule on who will round on this patient, as hospitalists switch teams weekly. If 7PM-7AM, please contact night-coverage at [email or web address], password [email or web address] for any overnight needs.  11/20/2018, 9:44 AM

## 2018-11-20 NOTE — Progress Notes (Signed)
Initial Nutrition Assessment  DOCUMENTATION CODES:   Not applicable  INTERVENTION:  -Boost Breeze po TID, each supplement provides 250 kcal and 9 grams of protein -Snacks BID to help increase energy and protein intake.  NUTRITION DIAGNOSIS:   Inadequate oral intake related to decreased appetite as evidenced by meal completion < 25%, per patient/family report.  GOAL:   Patient will meet greater than or equal to 90% of their needs  MONITOR:   PO intake, Labs, I & O's, Weight trends, Skin, Supplement acceptance  REASON FOR ASSESSMENT:   Malnutrition Screening Tool   ASSESSMENT:   Pt with PMH of cryptogenic cirrhosis, CHF, HTN, breast cancer, HLD, CLL and anxiety. PCP referred pt to ED d/t increased confusion.    Pt was eating lunch upon arrival. Observed that pt ate about 25% of lunch. Pt reports that her appetite was fine and she has not been experiencing any N/V. Re-visted pt and her husband was present. Her husband reports that recently her appetite has decreased and she eats between 25-50% of all her meals. Husband and pt also report >20# wt loss over the past 2 weeks. They attribute the wt loss to multiple falls she experienced.   A normal intake is 2-3 meals and snacks in between. Pt lives at [place] and says her husband makes breakfast for her (cereal with blueberries). Lunch and dinner will be at the [facility] [place]. Pt mentioned options such as salads, grilled food, fried chicken, and hamburgers for lunch and dinner. Pt reported that she enjoys vegetables with lunch and dinner but did not give examples.   Snacks are often fruits (apples, grapes, oranges) and chips or cookies. Pt reported that she keeps snacks in her room. Pt and husband said she drinks the Boost Chocolate intermittently at the ILF. Pt expressed dislike of Ensure and Husband agreed.  Recommended Boost Breeze; Husband was very eager but pt was slightly reluctant. Husband said she had the [place] and enjoyed it but pt kept saying she only likes chocolate. Recommended incorporating snacks to help d/t decreased intake. Pt was receptive to this. Encouraged husband to assist with ordering meals if possible; reviewed the menu and how to order.   NFPE revealed mild depletion in orbital region, temple, clavicle, and acromion. Moderate edema present on lower legs.    Medications reviewed and include: Lactulose  Labs reviewed   NUTRITION - FOCUSED PHYSICAL EXAM:    Most Recent Value  Orbital Region  Mild depletion  Upper Arm Region  No depletion  Thoracic and Lumbar Region  No depletion  Buccal Region  No depletion  Temple Region  Mild depletion  Clavicle Bone Region  Mild depletion  Clavicle and Acromion Bone Region  Mild depletion  Scapular Bone Region  No depletion  Dorsal Hand  No depletion  Anterior Thigh Region  No depletion  Posterior Calf Region  No depletion  Edema (RD Assessment)  Moderate [LLE, RLE]  Hair  Reviewed  Eyes  Reviewed  Mouth  Reviewed  Skin  Reviewed  Nails  Reviewed      Diet Order:   Diet Order            Diet 2 gram sodium Room service appropriate? Yes; Fluid consistency: Thin; Fluid restriction: 1200 mL Fluid  Diet effective now              EDUCATION NEEDS:   Education needs have been addressed  Skin:  Skin Assessment: Skin Integrity Issues:(MASD  to groin/abdomen)  Last BM:  1/21  Height:   Ht Readings from Last 1 Encounters:  11/19/18 5' 3"  (1.6 m)    Weight:   Wt Readings from Last 1 Encounters:  11/20/18 71.9 kg    Ideal Body Weight:  52.27 kg  BMI:  Body mass index is 28.08 kg/m.  Estimated Nutritional Needs:   Kcal:  1550-1750 kcal  Protein:  78-88 g  Fluid:  1.2L    [employer] Dietetic Intern

## 2018-11-21 DIAGNOSIS — G9340 Encephalopathy, unspecified: Secondary | ICD-10-CM

## 2018-11-21 LAB — COMPREHENSIVE METABOLIC PANEL
ALK PHOS: 240 U/L — AB (ref 38–126)
ALT: 65 U/L — ABNORMAL HIGH (ref 0–44)
AST: 176 U/L — ABNORMAL HIGH (ref 15–41)
Albumin: 2.6 g/dL — ABNORMAL LOW (ref 3.5–5.0)
Anion gap: 10 (ref 5–15)
BILIRUBIN TOTAL: 5.7 mg/dL — AB (ref 0.3–1.2)
BUN: 24 mg/dL — ABNORMAL HIGH (ref 8–23)
CO2: 31 mmol/L (ref 22–32)
Calcium: 8.8 mg/dL — ABNORMAL LOW (ref 8.9–10.3)
Chloride: 95 mmol/L — ABNORMAL LOW (ref 98–111)
Creatinine, Ser: 1.15 mg/dL — ABNORMAL HIGH (ref 0.44–1.00)
GFR calc Af Amer: 49 mL/min — ABNORMAL LOW (ref >60)
GFR, EST NON AFRICAN AMERICAN: 42 mL/min — AB (ref >60)
Glucose, Bld: 124 mg/dL — ABNORMAL HIGH (ref 70–99)
Potassium: 4.3 mmol/L (ref 3.5–5.1)
Sodium: 136 mmol/L (ref 135–145)
TOTAL PROTEIN: 6.2 g/dL — AB (ref 6.5–8.1)

## 2018-11-21 LAB — CBC
HCT: 36.4 % (ref 36.0–46.0)
Hemoglobin: 12.9 g/dL (ref 12.0–15.0)
MCH: 34.6 pg — ABNORMAL HIGH (ref 26.0–34.0)
MCHC: 35.4 g/dL (ref 30.0–36.0)
MCV: 97.6 fL (ref 80.0–100.0)
Platelets: 126 10*3/uL — ABNORMAL LOW (ref 150–400)
RBC: 3.73 MIL/uL — ABNORMAL LOW (ref 3.87–5.11)
RDW: 17.3 % — ABNORMAL HIGH (ref 11.5–15.5)
WBC: 22.3 10*3/uL — ABNORMAL HIGH (ref 4.0–10.5)
nRBC: 0 % (ref 0.0–0.2)

## 2018-11-21 LAB — VITAMIN B12: VITAMIN B 12: 3182 pg/mL — AB (ref 180–914)

## 2018-11-21 MED ORDER — LACTULOSE 10 GM/15ML PO SOLN: 20.0000 g | Freq: Three times a day (TID) | ORAL | 0 refills | Status: DC

## 2018-11-21 MED ORDER — LORAZEPAM 0.5 MG PO TABS: 0.5000 mg | ORAL_TABLET | Freq: Every day | ORAL | 0 refills | Status: DC | PRN

## 2018-11-21 NOTE — Care Management Note (Signed)
Case Management Note  Patient Details  Name: Karen Richard MRN: [id number] Date of Birth: [birth year]  Subjective/Objective:  CHF                Action/Plan: CM talked to patient at the bedside for [facility] choices, pt stated that she had [place] already established prior to admission but do not want it resumed at this time due to an Orthopedic procedure that patient is to have. CM informed spouse that when she is ready to resume her [place], orders are in the hospital system.  Expected Discharge Date:  11/21/18               Expected Discharge Plan:  [place]  Discharge planning Services  CM Consult  Post Acute Care Choice:    Choice offered to:  Patient, Spouse  HH Arranged:  RN, PT [place] Agency:  [facility]  Status of Service:  In process, will continue to follow  Sherrilyn Rist [phone] 11/21/2018, 11:47 AM

## 2018-11-21 NOTE — Discharge Summary (Signed)
Physician Discharge Summary  Karen Richard JSE:[id number] DOB: 1927-05-11 DOA: 11/19/2018  PCP: Karen Macadam, MD  Admit date: 11/19/2018 Discharge date: 11/21/2018  Time spent: 35 minutes  Recommendations for Outpatient Follow-up:  1. Needs lactulose to be given and should take it daily as directed-I have discontinued Ativan this admission 2. Recommend outpatient GI nonemergent input regarding cirrhosis and work-up may need hepatitis series etc. etc. although feel this is more likely secondary to CBC liver baseline weight seems to be about 175-would monitor weights salt and fluid intake daily 3. And get Chem-12 and ammonia in 1 week adjust diuretics as needed 4. Right knee warrants attention to hyperkeratotic lesion may need dermatopathology done on it although it is been there for 12 years  Discharge Diagnoses:  Principal Problem:   Acute encephalopathy Active Problems:   Lymphedema   Cirrhosis of liver without ascites [facility])   Discharge Condition: Improved  Diet recommendation: Low-salt 1200 cc fluid restriction heart healthy  Filed Weights   11/19/18 2350 11/20/18 0449 11/21/18 0040  Weight: 72.3 kg 71.9 kg 40.21 kg   85 year old Caucasian female Karen Richard cirrhosis Breast cancer status post bilateral mastectomy 1999 CLL stage VIII Venous stasis Anemia of chronic disease Chronic low back pain Admitted 1/6-1/14/2020, with anasarca metabolic encephalopathy likely secondary to cirrhosis-note also prior admission 11/7-11/10 2018 for similar issues   Patient was admitted with acute hepatic encephalopathy and cleared gradually with use of lactulose She was also impacted and was given enemas and had a good bowel movement She was close to her baseline functional state on 1/23 was verbalizing without difficulty could tell me date time year and husband agreed that she was close to normal She ambulated in the hallway and the day prior to ambulation therapy saw her and recommended home  health She will need labs in about 1 week with a Chem-12 in addition to INR and ammonia level Only new medication this admission is lactulose which should be taken as directed  She is stable for discharge    Discharge Exam: Vitals:   11/21/18 0453 11/21/18 0820  BP: (!) 167/73 (!) 152/52  Pulse: (!) 109 (!) 104  Resp: 18 18  Temp: 98.5 F (36.9 C) 98.6 F (37 C)  SpO2: 93% 94%    General: Awake alert pleasant no distress Cardiovascular: S1-S2 no murmur rub or gallop slightly tachycardic Respiratory: Chest is clear no added sound Abdomen is soft nontender no rebound no guarding no hepatomegaly no splenomegaly no shifting dullness Lower extremities have mild swelling she does have a hyperkeratotic lesion over the right knee which warrants attention  Discharge Instructions   Discharge Instructions    Diet - low sodium heart healthy   Complete by:  As directed    Discharge instructions   Complete by:  As directed    You were diagnosed with some backflow pressure onto the liver that probably has been causing you to develop congestion of the liver-this can cause confusion as the liver detoxifies a lot of things in the body For this reason you need to take lactulose as directed and have at least 2 loose stools a day to get rid of it Please check your weights daily Please avoid high amounts of fluid intake your diuretics Eventually you will need to see a stomach specialist with regards to your liver function but your regular doctor will get a report of the labs should be drawn at [facility] and we will get therapy to come out and see  Increase activity slowly   Complete by:  As directed      Allergies as of 11/21/2018      Reactions   Codeine Nausea Only   Darvocet [propoxyphene N-acetaminophen] Nausea Only      Medication List    STOP taking these medications   clotrimazole 1 % cream Commonly known as:  LOTRIMIN     TAKE these medications   aspirin EC 81 MG EC  tablet Generic drug:  aspirin Take 81 mg by mouth every morning.   lactulose 10 GM/15ML solution Commonly known as:  CHRONULAC Take 30 mLs (20 g total) by mouth 3 (three) times daily.   LORazepam 0.5 MG tablet Commonly known as:  ATIVAN Take 1 tablet (0.5 mg total) by mouth daily as needed for anxiety. What changed:  See the new instructions.   potassium chloride SA 20 MEQ tablet Commonly known as:  K-DUR,KLOR-CON Take 1 tablet (20 mEq total) by mouth daily.   spironolactone 25 MG tablet Commonly known as:  ALDACTONE Take 1 tablet (25 mg total) by mouth daily.   torsemide 20 MG tablet Commonly known as:  DEMADEX Take 2 tablets (40 mg total) by mouth every morning AND 1 tablet (20 mg total) every evening. What changed:  See the new instructions.   TYLENOL ARTHRITIS EXT RELIEF PO Take 1 tablet by mouth as needed (pain).   Vitamin D (Ergocalciferol) 1.25 MG (50000 UT) Caps capsule Commonly known as:  DRISDOL TAKE 1 CAPSULE ONCE A WEEK What changed:  See the new instructions.      Allergies  Allergen Reactions  . Codeine Nausea Only  . Darvocet [Propoxyphene N-Acetaminophen] Nausea Only      The results of significant diagnostics from this hospitalization (including imaging, microbiology, ancillary and laboratory) are listed below for reference.    Significant Diagnostic Studies: Dg Chest 2 View  Result Date: 11/19/2018 CLINICAL DATA:  Overall weakness since yesterday. EXAM: CHEST - 2 VIEW COMPARISON:  11/15/2018 FINDINGS: The heart size and mediastinal contours are within normal limits. Nonaneurysmal atherosclerotic aorta. Both lungs are clear. The visualized skeletal structures are unremarkable. IMPRESSION: No active cardiopulmonary disease.  Aortic atherosclerosis. Electronically Signed   By: Ashley Royalty M.D.   On: 11/19/2018 16:17   Dg Chest 2 View  Result Date: 11/15/2018 CLINICAL DATA:  Weakness over the last month. Falling. EXAM: CHEST - 2 VIEW COMPARISON:   11/04/2018 FINDINGS: Artifact overlies the chest. Heart size upper limits of normal. Chronic aortic atherosclerosis. Chronic linear scar the lung. No evidence of infiltrate, collapse or effusion. No sign of heart failure. No significant bone finding. IMPRESSION: No active disease. Aortic atherosclerosis. Electronically Signed   By: Nelson Chimes M.D.   On: 11/15/2018 09:55   Dg Chest 2 View  Result Date: 11/04/2018 CLINICAL DATA:  Excessive lower extremity edema. EXAM: CHEST - 2 VIEW COMPARISON:  09/05/2017 FINDINGS: AP and lateral views of the chest show cardiomegaly with low volumes. There is pulmonary vascular congestion without overt pulmonary edema. Tiny bilateral pleural effusions noted posteriorly. Subsegmental atelectasis or scarring evident left midlung. The visualized bony structures of the thorax are intact. Telemetry leads overlie the chest. IMPRESSION: Cardiomegaly with vascular congestion and tiny bilateral pleural effusions. Electronically Signed   By: Misty Stanley M.D.   On: 11/04/2018 16:14   Ct Head Wo Contrast  Result Date: 11/19/2018 CLINICAL DATA:  Increased confusion.  Fall 4 days ago. EXAM: CT HEAD WITHOUT CONTRAST TECHNIQUE: Contiguous axial images were obtained from the base  of the skull through the vertex without intravenous contrast. COMPARISON:  11/15/2018 FINDINGS: Brain: Mild global atrophy is appropriate to age. Mild chronic ischemic changes in the periventricular white matter. There is no mass effect, midline shift, or acute hemorrhage. Vascular: No hyperdense vessel or unexpected calcification. Skull: The cranium is intact. Sinuses/Orbits: Orbits are within normal limits. Visualized paranasal sinuses and mastoid air cells are clear. Other: Noncontributory. IMPRESSION: No acute intracranial pathology. Electronically Signed   By: Marybelle Killings M.D.   On: 11/19/2018 19:53   Ct Head Wo Contrast  Result Date: 11/15/2018 CLINICAL DATA:  Weakness and two falls. Patient stated she  hit the back of her head. Denies pain EXAM: CT HEAD WITHOUT CONTRAST TECHNIQUE: Contiguous axial images were obtained from the base of the skull through the vertex without intravenous contrast. COMPARISON:  None. FINDINGS: Brain: No evidence of acute infarction, hemorrhage, hydrocephalus, extra-axial collection or mass lesion/mass effect. Mild patchy areas of white matter hypoattenuation consistent with chronic microvascular ischemic change. Vascular: No hyperdense vessel or unexpected calcification. Skull: Normal. Negative for fracture or focal lesion. Sinuses/Orbits: Globes and orbits are unremarkable. The visualized sinuses and mastoid air cells are clear. Other: None. IMPRESSION: 1. No acute intracranial abnormalities. 2. Mild chronic microvascular ischemic change. Electronically Signed   By: Lajean Manes M.D.   On: 11/15/2018 09:47   Mr Lumbar Spine Wo Contrast  Result Date: 11/02/2018 CLINICAL DATA:  The patient suffered a low back injury lifting her husband after he had had surgery approximately 2 months ago. Continued back pain. The patient also has right hip and lower extremity pain. Initial encounter. EXAM: MRI LUMBAR SPINE WITHOUT CONTRAST TECHNIQUE: Multiplanar, multisequence MR imaging of the lumbar spine was performed. No intravenous contrast was administered. COMPARISON:  Plain films lumbar spine 09/26/2017. FINDINGS: Segmentation:  Standard. Alignment:  0.3 cm anterolisthesis L3 on L4 is unchanged. Vertebrae: The patient has superior endplate compression fractures of T12, L1, L2 and L3. There is marrow edema in L1 and L3 consistent with acute or subacute injuries. Edema is more intense in L3. Vertebral body height loss at L1 is estimated at 15% centrally and approximately 50% centrally and anteriorly at L3. Remote T12 and L2 compression fractures demonstrate approximately 40% vertebral body height loss centrally at T12 and 50% vertebral body height loss anteriorly at L2. All of the patient's  fractures have an appearance most consistent with senile osteoporotic injuries rather than pathologic fractures. Conus medullaris and cauda equina: Conus extends to the L1-2 level. Conus and cauda equina appear normal. Paraspinal and other soft tissues: Negative. Disc levels: The T11-12 is imaged in the sagittal plane only. There is mild bony retropulsion off the superior endplate of M78 and a shallow disc bulge. The ventral thecal sac appears effaced. The foramina appear open. T12-L1: Minimal retropulsion off the superior endplate of L1. No stenosis. L1-2: Mild-to-moderate facet degenerative change and ligamentum flavum thickening. There is a shallow disc bulge and some bony retropulsion off the superior endplate of L2. Moderate central canal stenosis and mild to moderate bilateral foraminal narrowing is seen. L2-3: There is a shallow right lateral recess and foraminal protrusion and mild bony retropulsion off the superior endplate. Ligamentum flavum thickening is seen. There is moderate central canal stenosis and mild to moderate bilateral foraminal narrowing. L3-4: Shallow right paracentral protrusion extends into the foramen. There is mild narrowing in the right lateral recess and mild bilateral foraminal narrowing. L4-5: Shallow broad-based central protrusion causes mild narrowing in the left lateral recess. Mild  bilateral foraminal narrowing is present. There is marked loss of disc space height. L5-S1: Loss of disc space height and a shallow broad-based left paracentral and lateral recess protrusion. There is mild narrowing in the left lateral recess and foramen. The right foramen is open. IMPRESSION: The examination is positive for acute or subacute compression fractures of L1 and L3 with marrow edema in the vertebral bodies, more intense in L3. Vertebral body height loss centrally at L1 is estimated at up to 15% and up to approximately 50% centrally and anteriorly in L3. The fractures have an appearance most  consistent with senile osteoporotic injuries. Remote T12 and L2 compression fractures. Moderate central canal stenosis and mild to moderate bilateral foraminal narrowing at L1-2 where there is a shallow disc bulge and mild bony retropulsion off the superior endplate of L2. Moderate central canal stenosis and mild to moderate bilateral foraminal narrowing at L2-3 where there is mild bony retropulsion off the superior endplate of L3 and a shallow right lateral recess and foraminal protrusion. Mild lateral recess and foraminal narrowing at L3-4, L4-5 and L5-S1 as described above. Electronically Signed   By: Inge Rise M.D.   On: 11/02/2018 09:56   US Abdomen Limited  Result Date: 11/09/2018 CLINICAL DATA:  85 year old female with ascites. Evaluate for paracentesis. EXAM: LIMITED ABDOMEN ULTRASOUND FOR ASCITES TECHNIQUE: Limited ultrasound survey for ascites was performed in all four abdominal quadrants. COMPARISON:  Abdominal ultrasound 11/09/2018 FINDINGS: Sonographic interrogation of the 4 quadrants of the abdomen demonstrates mild ascites. There is insufficient ascites for paracentesis at this time. IMPRESSION: Insufficient fluid for paracentesis. Electronically Signed   By: Jacqulynn Cadet M.D.   On: 11/09/2018 11:49   Ue Venous Duplex ([facility] And [facility] Only)  Result Date: 11/05/2018 UPPER VENOUS STUDY  Indications: Edema Performing Technologist: Toma Copier RVS  Examination Guidelines: A complete evaluation includes B-mode imaging, spectral Doppler, color Doppler, and power Doppler as needed of all accessible portions of each vessel. Bilateral testing is considered an integral part of a complete examination. Limited examinations for reoccurring indications may be performed as noted.  Right Findings: +----------+------------+----------+---------+-----------+-------+ RIGHT     CompressiblePropertiesPhasicitySpontaneousSummary +----------+------------+----------+---------+-----------+-------+  Subclavian                         Yes       Yes            +----------+------------+----------+---------+-----------+-------+  Left Findings: +----------+------------+----------+---------+-----------+-------+ LEFT      CompressiblePropertiesPhasicitySpontaneousSummary +----------+------------+----------+---------+-----------+-------+ IJV           Full                 Yes       Yes            +----------+------------+----------+---------+-----------+-------+ Subclavian                         Yes       Yes            +----------+------------+----------+---------+-----------+-------+ Axillary      Full                 Yes       Yes            +----------+------------+----------+---------+-----------+-------+ Brachial      Full                 Yes       Yes            +----------+------------+----------+---------+-----------+-------+  Radial        Full                                          +----------+------------+----------+---------+-----------+-------+ Ulnar         Full                                          +----------+------------+----------+---------+-----------+-------+ Cephalic      Full                                          +----------+------------+----------+---------+-----------+-------+ Basilic       Full                 Yes       Yes            +----------+------------+----------+---------+-----------+-------+  Summary:  Right: No evidence of thrombosis in the subclavian.  Left: No evidence of deep vein thrombosis in the upper extremity. No evidence of superficial vein thrombosis in the upper extremity. No evidence of thrombosis in the subclavian.  *See table(s) above for measurements and observations.  Diagnosing physician: Sherren Mocha MD Electronically signed by Sherren Mocha MD on 11/05/2018 at 8:09:37 PM.    Final    US Abdomen Limited Ruq  Result Date: 11/09/2018 CLINICAL DATA:  Cirrhosis of the liver.  Abdominal  distension. EXAM: ULTRASOUND ABDOMEN LIMITED RIGHT UPPER QUADRANT COMPARISON:  Abdominal ultrasound 06/06/2016. CT of the abdomen pelvis 02/08/2016. FINDINGS: Gallbladder: Cholecystectomy. Common bile duct: Diameter: 7.4 mm, within normal limits for age. Liver: The liver is shrunken with a nodular border. There is diffuse increased echotexture. No discrete lesions are present. Portal vein is patent on color Doppler imaging with normal direction of blood flow towards the liver. Moderate abdominal ascites is evident about the liver. IMPRESSION: 1. Progressive changes of hepatic cirrhosis. 2. No discrete hepatic lesion. 3. Moderate abdominal ascites. Electronically Signed   By: San Morelle M.D.   On: 11/09/2018 07:49    Microbiology: No results found for this or any previous visit (from the past 240 hour(s)).   Labs: Basic Metabolic Panel: Recent Labs  Lab 11/15/18 1000 11/19/18 1501 11/19/18 2349 11/20/18 0300 11/21/18 0534  NA 133* 133*  --  133* 136  K 3.7 3.8  --  3.8 4.3  CL 84* 90*  --  93* 95*  CO2 35* 29  --  29 31  GLUCOSE 119* 136*  --  135* 124*  BUN 23 27*  --  25* 24*  CREATININE 1.31* 1.21* 1.20* 1.03* 1.15*  CALCIUM 9.2 8.8*  --  8.6* 8.8*   Liver Function Tests: Recent Labs  Lab 11/15/18 1000 11/19/18 1501 11/20/18 0300 11/21/18 0534  AST 154* 191* 165* 176*  ALT 52* 68* 60* 65*  ALKPHOS 232* 269* 218* 240*  BILITOT 4.6* 5.5* 4.8* 5.7*  PROT 5.8* 7.0 6.1* 6.2*  ALBUMIN 2.5* 2.9* 2.4* 2.6*   No results for input(s): LIPASE, AMYLASE in the last 168 hours. Recent Labs  Lab 11/19/18 2055  AMMONIA 64*   CBC: Recent Labs  Lab 11/15/18 1000 11/19/18 1501 11/20/18 0047 11/21/18 0534  WBC 17.9* 27.4* 22.5* 22.3*  NEUTROABS 14.1*  20.6*  --   --   HGB 12.9 14.0 12.3 12.9  HCT 37.9 41.0 34.4* 36.4  MCV 98.4 99.0 95.8 97.6  PLT PLATELET CLUMPS NOTED ON SMEAR, COUNT APPEARS DECREASED 143* 119* 126*   Cardiac Enzymes: No results for input(s): CKTOTAL,  CKMB, CKMBINDEX, TROPONINI in the last 168 hours. BNP: BNP (last 3 results) Recent Labs    11/04/18 1520 11/19/18 1503  BNP 97.5 52.0    ProBNP (last 3 results) No results for input(s): PROBNP in the last 8760 hours.  CBG: Recent Labs  Lab 11/19/18 1633  GLUCAP 106*       Signed:  Nita Sells MD   [facility] 11/21/2018, 10:27 AM

## 2018-11-21 NOTE — Plan of Care (Signed)
  Problem: Education: Goal: Knowledge of General Education information will improve Description Including pain rating scale, medication(s)/side effects and non-pharmacologic comfort measures Outcome: Progressing   Problem: Clinical Measurements: Goal: Ability to maintain clinical measurements within normal limits will improve Outcome: Progressing   Problem: Activity: Goal: Risk for activity intolerance will decrease Outcome: Progressing   Problem: Nutrition: Goal: Adequate nutrition will be maintained Outcome: Progressing   Problem: Safety: Goal: Ability to remain free from injury will improve Outcome: Progressing   Problem: Skin Integrity: Goal: Risk for impaired skin integrity will decrease Outcome: Progressing

## 2018-11-21 NOTE — Progress Notes (Signed)
Discharge instructions reviewed with pt and husband. Pt and husband verbalize understanding and state they have no questions. Pt belongings with pt. Pt is not in distress. Pt's husband is driving her home. Pt discharged via wheelchair.

## 2018-11-21 NOTE — Progress Notes (Signed)
RN rounded on pt. Pt and husband state they do not need anything at this time.

## 2018-11-22 NOTE — Progress Notes (Signed)
PCP: Primary Cardiologist:  HPI: Karen Dibiasio OBrienis a 85 y.o.femalewith h/o chronic diastolic CHF, anxiety, cirrhosis, and HLD.   Sent to [facility] 11/04/2018 with significant BLE edema and worsening DOE. CHF team consulted for further diuresis.Diuresed with IV lasix and transitioned to torsemide 95m/20 mg. Readmitted on 11/19/18 with hepatic encephalapathy. Discharged on lactulose and referred to GI for follow up regarding cirrhosis. Discharge weight on 1/23/20202 was 155 pounds.   Today she presents for post hospital follow up. Overall feeling fair. Complaining of  fatigue. Having ongoing memory issues.  Denies SOB/PND/Orthopnea. Requires assistance with ADLs. Having some dizziness.  Appetite poor. No fever or chills. She has not been weighing at home due to balance issues. Taking all medications. [facility]   ECHO 11/05/2018 Left ventricle: The cavity size was normal. Systolic function was   normal. The estimated ejection fraction was in the range of 60%   to 65%. Wall motion was normal; there were no regional wall   motion abnormalities. - Mitral valve: There was trivial regurgitation. - Tricuspid valve: There was mild regurgitation. - Pulmonary arteries: PA peak pressure: 48 mm Hg (S).  ROS: All systems negative except as listed in HPI, PMH and Problem List.  SH:  Social History   Socioeconomic History  . Marital status: Married    Spouse name: Not on file  . Number of children: Not on file  . Years of education: Not on file  . Highest education level: Not on file  Occupational History  . Not on file  Social Needs  . Financial resource strain: Not on file  . Food insecurity:    Worry: Not on file    Inability: Not on file  . Transportation needs:    Medical: Not on file    Non-medical: Not on file  Tobacco Use  . Smoking status: Never Smoker  . Smokeless tobacco: Never Used  . Tobacco comment: never used tobacco  Substance and Sexual Activity  . Alcohol  use: No    Alcohol/week: 0.0 standard drinks  . Drug use: No  . Sexual activity: Not Currently  Lifestyle  . Physical activity:    Days per week: Not on file    Minutes per session: Not on file  . Stress: Not on file  Relationships  . Social connections:    Talks on phone: Not on file    Gets together: Not on file    Attends religious service: Not on file    Active member of club or organization: Not on file    Attends meetings of clubs or organizations: Not on file    Relationship status: Not on file  . Intimate partner violence:    Fear of current or ex partner: Not on file    Emotionally abused: Not on file    Physically abused: Not on file    Forced sexual activity: Not on file  Other Topics Concern  . Not on file  Social History Narrative  . Not on file    FH:  Family History  Problem Relation Age of Onset  . Diabetes Mother   . Heart attack Mother   . Diabetes Father   . Heart attack Father     Past Medical History:  Diagnosis Date  . Anxiety   . Arthritis   . [place] ([place]   . Cataract   . Hyperlipidemia   . [place]romuscular disorder ([place]   . Swelling in chest    rigth breast where mastectomy was  performed     Current Outpatient Medications  Medication Sig Dispense Refill  . aspirin (ASPIRIN EC) 81 MG EC tablet Take 81 mg by mouth every morning.     . lactulose (CHRONULAC) 10 GM/15ML solution Take 30 mLs (20 g total) by mouth 3 (three) times daily. 236 mL 0  . LORazepam (ATIVAN) 0.5 MG tablet Take 1 tablet (0.5 mg total) by mouth daily as needed for anxiety. 2 tablet 0  . potassium chloride SA (K-DUR,KLOR-CON) 20 MEQ tablet Take 1 tablet (20 mEq total) by mouth daily. 30 tablet 0  . spironolactone (ALDACTONE) 25 MG tablet Take 1 tablet (25 mg total) by mouth daily. 30 tablet 0  . torsemide (DEMADEX) 20 MG tablet Take 2 tablets (40 mg total) by mouth every morning AND 1 tablet (20 mg total) every evening. 90 tablet 0  . Vitamin D, Ergocalciferol, (DRISDOL)  1.25 MG (50000 UT) CAPS capsule TAKE 1 CAPSULE ONCE A WEEK (Patient taking differently: Take 50,000 Units by mouth every 7 (seven) days. Saturday) 12 capsule 0  . Acetaminophen (TYLENOL ARTHRITIS EXT RELIEF PO) Take 1 tablet by mouth as needed (pain).      No current facility-administered medications for this encounter.     Vitals:   11/25/18 1202  BP: (!) 142/68  Pulse: (!) 105  SpO2: 93%  Weight: 70.3 kg (155 lb)   Wt Readings from Last 3 Encounters:  11/25/18 70.3 kg (155 lb)  11/21/18 70.5 kg (155 lb 8 oz)  11/12/18 83.6 kg (184 lb 3.2 oz)     PHYSICAL EXAM: General:  Elderly.  No resp difficulty HEENT: normal Neck: supple. JVP flat. Carotids 2+ bilaterally; no bruits. No lymphadenopathy or thryomegaly appreciated. Cor: PMI normal. Regular rate & rhythm. No rubs, gallops or murmurs. Lungs: clear Abdomen: soft, nontender, nondistended. No hepatosplenomegaly. No bruits or masses. Good bowel sounds. Extremities: no cyanosis, clubbing, rash, edema Neuro: alert & orientedx3, cranial nerves grossly intact. Moves all 4 extremities w/o difficulty. Affect pleasant.  ASSESSMENT & PLAN: 1. Chronic diastolic CHF with R>L symptoms -Echo1/04/2019 LVEF 60-65%, Trivial MR, Mild TR, PA peak pressure 48 mm Hg, and increased RVSP  - With R>L symptoms and ab cirrhosis TTR amyloid a possibility but given age and comorbidities will not pursue formal w/u - [place] VAS 11/04/2018 negative for LUE DVT - US VAS 10/18/18 negative for RLE DVT. -NYHA II-III. Volume status low. Cut back torsemide to 20 mg daily. Stop potassium  - Check BMET.   2. Anxiety - Per primary. No change.   3. CLL  - Followed by Dr. Marin Olp.   4. Cirrhosis -  Contributing to her anasarca. -Total bili 4.1.  - US Abdomen 11/08/18 with progressive hepatic cirrhosis - likely contributing to volume overload. Arlyce Harman added. - Check ammonia.  -Referred to Gi    Follow up in 3-4 weeks Dr Haroldine Laws. Discussed medications with  her husband. Needs Henry conversation next visit. Greater than 50% of the (total minutes 25) visit spent in counseling/coordination of care regarding medications and the above changes.     Amy Clegg NP-C  3:39 PM

## 2018-11-25 ENCOUNTER — Encounter (HOSPITAL_COMMUNITY): Payer: Self-pay

## 2018-11-25 ENCOUNTER — Ambulatory Visit (HOSPITAL_COMMUNITY)
Admission: RE | Admit: 2018-11-25 | Discharge: 2018-11-25 | Disposition: A | Discharge status: Home / Self Care | Payer: Medicare Other | Source: Ambulatory Visit | Attending: Cardiology | Admitting: Cardiology

## 2018-11-25 ENCOUNTER — Ambulatory Visit (HOSPITAL_COMMUNITY): Admission: RE | Admit: 2018-11-25 | Payer: Medicare Other | Source: Ambulatory Visit

## 2018-11-25 VITALS — BP 142/68 | HR 105 | Wt 155.0 lb

## 2018-11-25 DIAGNOSIS — M1990 Unspecified osteoarthritis, unspecified site: Secondary | ICD-10-CM | POA: Insufficient documentation

## 2018-11-25 DIAGNOSIS — K7460 Unspecified cirrhosis of liver: Secondary | ICD-10-CM | POA: Diagnosis not present

## 2018-11-25 DIAGNOSIS — F419 Anxiety disorder, unspecified: Secondary | ICD-10-CM | POA: Insufficient documentation

## 2018-11-25 DIAGNOSIS — I509 Heart failure, unspecified: Secondary | ICD-10-CM

## 2018-11-25 DIAGNOSIS — Z7982 Long term (current) use of aspirin: Secondary | ICD-10-CM | POA: Insufficient documentation

## 2018-11-25 DIAGNOSIS — Z8249 Family history of ischemic heart disease and other diseases of the circulatory system: Secondary | ICD-10-CM | POA: Insufficient documentation

## 2018-11-25 DIAGNOSIS — Z79899 Other long term (current) drug therapy: Secondary | ICD-10-CM | POA: Insufficient documentation

## 2018-11-25 DIAGNOSIS — Z9011 Acquired absence of right breast and nipple: Secondary | ICD-10-CM | POA: Insufficient documentation

## 2018-11-25 DIAGNOSIS — I5032 Chronic diastolic (congestive) heart failure: Secondary | ICD-10-CM | POA: Diagnosis present

## 2018-11-25 DIAGNOSIS — Z859 Personal history of malignant neoplasm, unspecified: Secondary | ICD-10-CM | POA: Diagnosis not present

## 2018-11-25 DIAGNOSIS — I071 Rheumatic tricuspid insufficiency: Secondary | ICD-10-CM | POA: Diagnosis not present

## 2018-11-25 DIAGNOSIS — C9110 Chronic lymphocytic leukemia of B-cell type not having achieved remission: Secondary | ICD-10-CM | POA: Diagnosis not present

## 2018-11-25 DIAGNOSIS — Z833 Family history of diabetes mellitus: Secondary | ICD-10-CM | POA: Diagnosis not present

## 2018-11-25 LAB — CULTURE, BLOOD (ROUTINE X 2)
Culture: NO GROWTH
Culture: NO GROWTH

## 2018-11-25 LAB — BASIC METABOLIC PANEL
Anion gap: 13 (ref 5–15)
BUN: 28 mg/dL — ABNORMAL HIGH (ref 8–23)
CALCIUM: 9.1 mg/dL (ref 8.9–10.3)
CO2: 25 mmol/L (ref 22–32)
Chloride: 93 mmol/L — ABNORMAL LOW (ref 98–111)
Creatinine, Ser: 1.24 mg/dL — ABNORMAL HIGH (ref 0.44–1.00)
GFR calc Af Amer: 45 mL/min — ABNORMAL LOW (ref >60)
GFR calc non Af Amer: 38 mL/min — ABNORMAL LOW (ref >60)
Glucose, Bld: 149 mg/dL — ABNORMAL HIGH (ref 70–99)
Potassium: 4.4 mmol/L (ref 3.5–5.1)
Sodium: 131 mmol/L — ABNORMAL LOW (ref 135–145)

## 2018-11-25 LAB — AMMONIA: AMMONIA: 52 umol/L — AB (ref 9–35)

## 2018-11-25 MED ORDER — TORSEMIDE 20 MG PO TABS: 20.0000 mg | ORAL_TABLET | Freq: Every day | ORAL | 2 refills | Status: AC

## 2018-11-25 NOTE — Patient Instructions (Signed)
DECREASE Torsemide to 20 mg, one tab daily STOP Potassium     Labs today We will only contact you if something comes back abnormal or we need to make some changes. Otherwise no news is good news!    Your physician recommends that you schedule a follow-up appointment in: 3-4 weeks with Dr Haroldine Laws    Do the following things EVERYDAY: 1) Weigh yourself in the morning before breakfast. Write it down and keep it in a log. 2) Take your medicines as prescribed 3) Eat low salt foods-Limit salt (sodium) to 2000 mg per day.  4) Stay as active as you can everyday 5) Limit all fluids for the day to less than 2 liters

## 2018-11-27 ENCOUNTER — Telehealth (HOSPITAL_COMMUNITY): Payer: Self-pay | Admitting: Cardiology

## 2018-11-27 MED ORDER — LACTULOSE 10 GM/15ML PO SOLN: 20.0000 g | Freq: Three times a day (TID) | ORAL | 0 refills | Status: DC

## 2018-11-27 NOTE — Telephone Encounter (Signed)
Notes recorded by Kerry Dory, CMA on 11/27/2018 at 10:13 AM EST Patient aware. Patients husband aware, requested a refill of lactulose as last dose was last night Advised I could refill x 1 and should mention at PCP follow up 1/30 for continued refills   ------  Notes recorded by Darrick Grinder D, NP on 11/26/2018 at 10:32 AM EST Renal function stable. Ammonia coming down. Continue lactulose as ordered. Please call her husband.

## 2018-11-27 NOTE — Telephone Encounter (Signed)
-----   Message from Conrad Cozad, NP sent at 11/26/2018 10:32 AM EST ----- Renal function stable. Ammonia coming down. Continue lactulose as ordered. Please call her husband.

## 2018-12-03 ENCOUNTER — Other Ambulatory Visit: Payer: Self-pay | Admitting: Family Medicine

## 2018-12-03 ENCOUNTER — Other Ambulatory Visit (HOSPITAL_COMMUNITY): Payer: Self-pay | Admitting: Adult Health

## 2018-12-03 DIAGNOSIS — D696 Thrombocytopenia, unspecified: Secondary | ICD-10-CM

## 2018-12-03 DIAGNOSIS — K7290 Hepatic failure, unspecified without coma: Secondary | ICD-10-CM

## 2018-12-03 DIAGNOSIS — K7682 Hepatic encephalopathy: Secondary | ICD-10-CM

## 2018-12-03 DIAGNOSIS — K7460 Unspecified cirrhosis of liver: Secondary | ICD-10-CM

## 2018-12-09 ENCOUNTER — Encounter (HOSPITAL_COMMUNITY): Payer: Self-pay | Admitting: *Deleted

## 2018-12-09 ENCOUNTER — Emergency Department (HOSPITAL_COMMUNITY): Payer: Medicare Other

## 2018-12-09 ENCOUNTER — Inpatient Hospital Stay (HOSPITAL_COMMUNITY)
Admission: EM | Admit: 2018-12-09 | Discharge: 2018-12-17 | DRG: 442 | Disposition: A | Discharge status: Skilled Nursing Facility | Payer: Medicare Other | Attending: Internal Medicine | Admitting: Internal Medicine

## 2018-12-09 DIAGNOSIS — Z789 Other specified health status: Secondary | ICD-10-CM

## 2018-12-09 DIAGNOSIS — K7210 Chronic hepatic failure without coma: Secondary | ICD-10-CM | POA: Diagnosis present

## 2018-12-09 DIAGNOSIS — E785 Hyperlipidemia, unspecified: Secondary | ICD-10-CM | POA: Diagnosis present

## 2018-12-09 DIAGNOSIS — D582 Other hemoglobinopathies: Secondary | ICD-10-CM

## 2018-12-09 DIAGNOSIS — E8770 Fluid overload, unspecified: Secondary | ICD-10-CM | POA: Diagnosis present

## 2018-12-09 DIAGNOSIS — Z8249 Family history of ischemic heart disease and other diseases of the circulatory system: Secondary | ICD-10-CM

## 2018-12-09 DIAGNOSIS — R531 Weakness: Secondary | ICD-10-CM

## 2018-12-09 DIAGNOSIS — I447 Left bundle-branch block, unspecified: Secondary | ICD-10-CM | POA: Diagnosis present

## 2018-12-09 DIAGNOSIS — K7581 Nonalcoholic steatohepatitis (NASH): Secondary | ICD-10-CM | POA: Diagnosis present

## 2018-12-09 DIAGNOSIS — K7290 Hepatic failure, unspecified without coma: Secondary | ICD-10-CM

## 2018-12-09 DIAGNOSIS — Z66 Do not resuscitate: Secondary | ICD-10-CM | POA: Diagnosis present

## 2018-12-09 DIAGNOSIS — W19XXXA Unspecified fall, initial encounter: Secondary | ICD-10-CM

## 2018-12-09 DIAGNOSIS — K7200 Acute and subacute hepatic failure without coma: Principal | ICD-10-CM | POA: Diagnosis present

## 2018-12-09 DIAGNOSIS — N183 Chronic kidney disease, stage 3 (moderate): Secondary | ICD-10-CM | POA: Diagnosis present

## 2018-12-09 DIAGNOSIS — R718 Other abnormality of red blood cells: Secondary | ICD-10-CM

## 2018-12-09 DIAGNOSIS — Z9660 Presence of unspecified orthopedic joint implant: Secondary | ICD-10-CM | POA: Diagnosis present

## 2018-12-09 DIAGNOSIS — E875 Hyperkalemia: Secondary | ICD-10-CM | POA: Diagnosis not present

## 2018-12-09 DIAGNOSIS — Z833 Family history of diabetes mellitus: Secondary | ICD-10-CM

## 2018-12-09 DIAGNOSIS — C9110 Chronic lymphocytic leukemia of B-cell type not having achieved remission: Secondary | ICD-10-CM | POA: Diagnosis present

## 2018-12-09 DIAGNOSIS — I5032 Chronic diastolic (congestive) heart failure: Secondary | ICD-10-CM | POA: Diagnosis present

## 2018-12-09 DIAGNOSIS — Z853 Personal history of malignant neoplasm of breast: Secondary | ICD-10-CM

## 2018-12-09 DIAGNOSIS — Z9011 Acquired absence of right breast and nipple: Secondary | ICD-10-CM

## 2018-12-09 DIAGNOSIS — K7460 Unspecified cirrhosis of liver: Secondary | ICD-10-CM | POA: Diagnosis present

## 2018-12-09 DIAGNOSIS — Z6827 Body mass index (BMI) 27.0-27.9, adult: Secondary | ICD-10-CM

## 2018-12-09 DIAGNOSIS — D696 Thrombocytopenia, unspecified: Secondary | ICD-10-CM | POA: Diagnosis present

## 2018-12-09 DIAGNOSIS — E871 Hypo-osmolality and hyponatremia: Secondary | ICD-10-CM | POA: Diagnosis present

## 2018-12-09 DIAGNOSIS — F419 Anxiety disorder, unspecified: Secondary | ICD-10-CM | POA: Diagnosis present

## 2018-12-09 DIAGNOSIS — N179 Acute kidney failure, unspecified: Secondary | ICD-10-CM

## 2018-12-09 DIAGNOSIS — R7989 Other specified abnormal findings of blood chemistry: Secondary | ICD-10-CM

## 2018-12-09 DIAGNOSIS — R188 Other ascites: Secondary | ICD-10-CM

## 2018-12-09 DIAGNOSIS — M1990 Unspecified osteoarthritis, unspecified site: Secondary | ICD-10-CM | POA: Diagnosis present

## 2018-12-09 DIAGNOSIS — G9340 Encephalopathy, unspecified: Secondary | ICD-10-CM | POA: Diagnosis present

## 2018-12-09 DIAGNOSIS — G2581 Restless legs syndrome: Secondary | ICD-10-CM | POA: Diagnosis present

## 2018-12-09 DIAGNOSIS — Y92009 Unspecified place in unspecified non-institutional (private) residence as the place of occurrence of the external cause: Secondary | ICD-10-CM

## 2018-12-09 DIAGNOSIS — E8779 Other fluid overload: Secondary | ICD-10-CM | POA: Diagnosis present

## 2018-12-09 DIAGNOSIS — E46 Unspecified protein-calorie malnutrition: Secondary | ICD-10-CM | POA: Diagnosis present

## 2018-12-09 DIAGNOSIS — Z885 Allergy status to narcotic agent status: Secondary | ICD-10-CM

## 2018-12-09 LAB — URINALYSIS, ROUTINE W REFLEX MICROSCOPIC
Bilirubin Urine: NEGATIVE
GLUCOSE, UA: 50 mg/dL — AB
Ketones, ur: NEGATIVE mg/dL
Leukocytes, UA: NEGATIVE
Nitrite: NEGATIVE
Protein, ur: 100 mg/dL — AB
SPECIFIC GRAVITY, URINE: 1.017 (ref 1.005–1.030)
Trans Epithel, UA: 2
pH: 6 (ref 5.0–8.0)

## 2018-12-09 LAB — COMPREHENSIVE METABOLIC PANEL
ALT: 79 U/L — ABNORMAL HIGH (ref 0–44)
AST: 202 U/L — ABNORMAL HIGH (ref 15–41)
Albumin: 2.3 g/dL — ABNORMAL LOW (ref 3.5–5.0)
Alkaline Phosphatase: 268 U/L — ABNORMAL HIGH (ref 38–126)
Anion gap: 12 (ref 5–15)
BUN: 35 mg/dL — ABNORMAL HIGH (ref 8–23)
CO2: 26 mmol/L (ref 22–32)
Calcium: 8.6 mg/dL — ABNORMAL LOW (ref 8.9–10.3)
Chloride: 92 mmol/L — ABNORMAL LOW (ref 98–111)
Creatinine, Ser: 1.73 mg/dL — ABNORMAL HIGH (ref 0.44–1.00)
GFR calc Af Amer: 30 mL/min — ABNORMAL LOW (ref >60)
GFR, EST NON AFRICAN AMERICAN: 26 mL/min — AB (ref >60)
GLUCOSE: 113 mg/dL — AB (ref 70–99)
Potassium: 5 mmol/L (ref 3.5–5.1)
Sodium: 130 mmol/L — ABNORMAL LOW (ref 135–145)
TOTAL PROTEIN: 6 g/dL — AB (ref 6.5–8.1)
Total Bilirubin: 7 mg/dL — ABNORMAL HIGH (ref 0.3–1.2)

## 2018-12-09 LAB — CBC
HCT: 35.7 % — ABNORMAL LOW (ref 36.0–46.0)
Hemoglobin: 12.5 g/dL (ref 12.0–15.0)
MCH: 33.9 pg (ref 26.0–34.0)
MCHC: 35 g/dL (ref 30.0–36.0)
MCV: 96.7 fL (ref 80.0–100.0)
Platelets: 106 10*3/uL — ABNORMAL LOW (ref 150–400)
RBC: 3.69 MIL/uL — ABNORMAL LOW (ref 3.87–5.11)
RDW: 17.3 % — ABNORMAL HIGH (ref 11.5–15.5)
WBC: 24.8 10*3/uL — AB (ref 4.0–10.5)
nRBC: 0 % (ref 0.0–0.2)

## 2018-12-09 LAB — DIFFERENTIAL
Basophils Absolute: 0.5 10*3/uL — ABNORMAL HIGH (ref 0.0–0.1)
Basophils Relative: 2 %
Eosinophils Absolute: 0.5 10*3/uL (ref 0.0–0.5)
Eosinophils Relative: 2 %
Lymphocytes Relative: 7 %
Lymphs Abs: 1.7 10*3/uL (ref 0.7–4.0)
Monocytes Absolute: 2.5 10*3/uL — ABNORMAL HIGH (ref 0.1–1.0)
Monocytes Relative: 10 %
NEUTROS PCT: 79 %
Neutro Abs: 19.6 10*3/uL — ABNORMAL HIGH (ref 1.7–7.7)

## 2018-12-09 LAB — AMMONIA: Ammonia: 21 umol/L (ref 9–35)

## 2018-12-09 LAB — BRAIN NATRIURETIC PEPTIDE: B Natriuretic Peptide: 208.4 pg/mL — ABNORMAL HIGH (ref 0.0–100.0)

## 2018-12-09 MED ORDER — SODIUM CHLORIDE 0.9% FLUSH
3.0000 mL | Freq: Once | INTRAVENOUS | Status: AC
  Administered 2018-12-09: 3 mL via INTRAVENOUS

## 2018-12-09 MED ORDER — FUROSEMIDE 10 MG/ML IJ SOLN: 20.0000 mg | Freq: Once | INTRAMUSCULAR | Status: DC

## 2018-12-09 MED ORDER — SODIUM CHLORIDE 0.9 % IV BOLUS
500.0000 mL | Freq: Once | INTRAVENOUS | Status: AC
  Administered 2018-12-09: 100 mL via INTRAVENOUS

## 2018-12-09 NOTE — ED Provider Notes (Signed)
Care assumed from previous provider PA [facility]. Please see note for further details. Case discussed, plan agreed upon. Will follow up on pending UA and ambulate as well as po challenge.   UA reviewed.  Nitrite and leukocytes are negative.  0-5 WBCs.  Many bacteria.  She is not having any dysuria.  Doubt UTI.   I personally evaluated the patient and discussed events leading to hospital visit today with patient and husband.  Her main complaint today is lower extremity pain and weakness.  She does have a fairly significant amount of edema on exam.  Faint crackles to bilateral lower lung fields as well. While she does have a mild bump in her creatinine, I am concerned that her edema is playing a role in her inability to ambulate. I d/c'd fluids - she had received about 100cc's. She typically ambulates without any assistive devices, however recently she has been having to use her husband's walker to get around.  She now does not feel like she can even safely ambulate with the walker.  Her labs were reviewed by me and notable for elevated liver enzymes.  While she has had elevated liver enzymes in the past, this is a fairly recent diagnosis. Husband reports that she has only had one consult with [facility] and is unsure of the etiology or treatment plan as far as these liver labs are concerned.    Given that patient is no longer able to ambulate safely at home, has pitting lower extremity edema, fine crackles to lower lung fields and elevated BNP, hospitalist was consulted who will admit.   Virgal Warmuth, Ozella Almond, PA-C 12/09/18 2216    Quintella Reichert, MD 12/10/18 1001

## 2018-12-09 NOTE — ED Triage Notes (Signed)
Pt in from PCP office for elevated ammonia level, pt has been getting progressively worse over the last month, has had increased falls and confusion at home

## 2018-12-09 NOTE — ED Notes (Signed)
Admitting MD at bedside.

## 2018-12-09 NOTE — ED Notes (Signed)
Pt moved to hospital bed for comfort; denies needs at present

## 2018-12-09 NOTE — ED Provider Notes (Signed)
[place] EMERGENCY DEPARTMENT Provider Note   CSN: [id number] Arrival date & time: 12/09/18  1143     History   Chief Complaint Chief Complaint  Patient presents with  . Abnormal Lab    HPI Karen Richard is a 85 y.o. female with a past medical history of CHF, CLL, LBBB, neuromuscular disorder, who presents today for evaluation of reported elevated ammonia level.  History is obtained from patient and her husband.  Patient follows with [facility] and was told she had an elevated ammonia level and needed to be seen.  She has reportedly been having progressive worsening and more falls over the past month with increased confusion at home.  She denies any prodromal symptoms, says that she tripped over her feet, she denies any injuries from the falls.  she states that she has not hit her head since the last time she got scanned.  No fevers.  She is not having more swelling in her legs than usual.  She is scheduled for an ultrasound on her abdomen, denies any abdominal pain currently.  The weakness, falls, or other symptoms has not gotten worse since her last CT head scan.   HPI  Past Medical History:  Diagnosis Date  . Anxiety   . Arthritis   . Cancer ([place])   . Cataract   . Hyperlipidemia   . Neuromuscular disorder ([place])   . Swelling in chest    rigth breast where mastectomy was performed     Patient Active Problem List   Diagnosis Date Noted  . Acute encephalopathy 11/19/2018  . Ascites   . CHF exacerbation ([place]) 11/07/2018  . Acute exacerbation of CHF (congestive heart failure) ([place]) 11/04/2018  . Edema of upper extremity 11/04/2018  . Intertrigo 11/04/2018  . Leukocytosis 11/04/2018  . Hypoalbuminemia 11/04/2018  . Elevated LFTs 11/04/2018  . Anxiety 11/04/2018  . Chronic back pain 11/04/2018  . Anasarca 09/05/2017  . Cirrhosis of liver without ascites ([place]) 02/09/2017  . LBBB (left bundle branch block) 03/31/2016  . Lymphedema 03/31/2016  .  Peripheral edema 02/06/2013  . Back pain, thoracic 05/14/2012  . CLL (chronic lymphocytic leukemia) ([place]) 02/19/2012  . Urinary frequency 01/11/2011  . RUQ abdominal pain 01/11/2011  . ARTHRITIS, CERVICAL SPINE 06/22/2010  . DISTURBANCE OF SKIN SENSATION 06/22/2010  . HYPOGLYCEMIA, UNSPECIFIED 02/08/2010  . IRREGULAR HEART RATE 02/01/2010  . ABNORMAL INVOLUNTARY MOVEMENTS 02/01/2010  . CHEST DISCOMFORT 02/01/2010  . INSOMNIA 07/22/2009  . OTHER MALAISE AND FATIGUE 07/22/2009  . HEAT INTOLERANCE 07/22/2009  . VITAMIN D DEFICIENCY 02/11/2009  . Malignant neoplasm of female breast ([place]) 12/04/2008  . ANXIETY, SITUATIONAL 07/22/2008  . NOCTURIA 07/22/2008  . Elevated blood pressure reading without diagnosis of hypertension 07/22/2008  . SYMPTOM, COUGH 03/11/2007  . ONYCHOMYCOSIS 12/27/2006  . HYPERLIPIDEMIA 12/27/2006  . RESTLESS LEGS SYNDROME 12/27/2006  . SEBORRHEIC KERATOSIS, NOS 12/27/2006  . DJD, UNSPECIFIED 12/27/2006  . OSTEOARTHRITIS, MULTI SITES 12/27/2006    Past Surgical History:  Procedure Laterality Date  . ABDOMINAL HYSTERECTOMY    . APPENDECTOMY    . BREAST SURGERY    . CHOLECYSTECTOMY    . EYE SURGERY    . JOINT REPLACEMENT       OB History   No obstetric history on file.      Home Medications    Prior to Admission medications   Medication Sig Start Date End Date Taking? Authorizing Provider  Acetaminophen (TYLENOL ARTHRITIS EXT RELIEF PO) Take 1 tablet by mouth  as needed (pain).    Yes [provider]  ciprofloxacin (CIPRO) 500 MG tablet Take 500 mg by mouth 2 (two) times daily. 12/04/18  Yes [provider]  lactulose (CHRONULAC) 10 GM/15ML solution Take 30 mLs (20 g total) by mouth 3 (three) times daily. Patient taking differently: Take 15 g by mouth 3 (three) times daily.  11/27/18  Yes Clegg, Amy D, NP  LORazepam (ATIVAN) 0.5 MG tablet Take 1 tablet (0.5 mg total) by mouth daily as needed for anxiety. 11/21/18  Yes Nita Sells, MD  spironolactone (ALDACTONE) 25 MG tablet Take 1 tablet (25 mg total) by mouth daily. 11/13/18  Yes Patrecia Pour, MD  torsemide (DEMADEX) 20 MG tablet Take 1 tablet (20 mg total) by mouth daily. 11/25/18  Yes Clegg, Amy D, NP  Vitamin D, Ergocalciferol, (DRISDOL) 1.25 MG (50000 UT) CAPS capsule TAKE 1 CAPSULE ONCE A WEEK Patient taking differently: Take 50,000 Units by mouth every 7 (seven) days. Saturday 11/04/18  Yes Volanda Napoleon, MD    Family History Family History  Problem Relation Age of Onset  . Diabetes Mother   . Heart attack Mother   . Diabetes Father   . Heart attack Father     Social History Social History   Tobacco Use  . Smoking status: Never Smoker  . Smokeless tobacco: Never Used  . Tobacco comment: never used tobacco  Substance Use Topics  . Alcohol use: No    Alcohol/week: 0.0 standard drinks  . Drug use: No     Allergies   Codeine and Darvocet [propoxyphene n-acetaminophen]   Review of Systems Review of Systems  Constitutional: Positive for appetite change (Decreased appetite). Negative for chills and fever.  Respiratory: Negative for chest tightness and shortness of breath.   Cardiovascular: Negative for chest pain.  Gastrointestinal: Negative for abdominal pain, diarrhea, nausea and vomiting.  All other systems reviewed and are negative.    Physical Exam Updated Vital Signs BP (!) 139/45 (BP Location: Right Arm)   Pulse 95   Temp (!) 97.5 F (36.4 C) (Oral)   Resp 16   SpO2 100%   Physical Exam Vitals signs and nursing note reviewed.  Constitutional:      General: She is not in acute distress.    Appearance: She is well-developed.  HENT:     Head: Normocephalic and atraumatic.     Nose: Nose normal.     Mouth/Throat:     Mouth: Mucous membranes are moist.  Eyes:     Conjunctiva/sclera: Conjunctivae normal.  Neck:     Musculoskeletal: Normal range of motion and neck supple. No neck rigidity.  Cardiovascular:      Rate and Rhythm: Normal rate and regular rhythm.     Pulses: Normal pulses.     Heart sounds: Normal heart sounds. No murmur.  Pulmonary:     Effort: Pulmonary effort is normal. No respiratory distress.     Breath sounds: Normal breath sounds.  Abdominal:     Palpations: Abdomen is soft.     Tenderness: There is no abdominal tenderness.  Musculoskeletal: Normal range of motion.     Right lower leg: Edema present.     Left lower leg: Edema present.  Skin:    General: Skin is warm and dry.  Neurological:     General: No focal deficit present.     Mental Status: She is alert and oriented to person, place, and time.     Cranial Nerves: No cranial  nerve deficit.  Psychiatric:        Mood and Affect: Mood normal.        Behavior: Behavior normal.      ED Treatments / Results  Labs (all labs ordered are listed, but only abnormal results are displayed) Labs Reviewed  COMPREHENSIVE METABOLIC PANEL - Abnormal; Notable for the following components:      Result Value   Sodium 130 (*)    Chloride 92 (*)    Glucose, Bld 113 (*)    BUN 35 (*)    Creatinine, Ser 1.73 (*)    Calcium 8.6 (*)    Total Protein 6.0 (*)    Albumin 2.3 (*)    AST 202 (*)    ALT 79 (*)    Alkaline Phosphatase 268 (*)    Total Bilirubin 7.0 (*)    GFR calc non Af Amer 26 (*)    GFR calc Af Amer 30 (*)    All other components within normal limits  CBC - Abnormal; Notable for the following components:   WBC 24.8 (*)    RBC 3.69 (*)    HCT 35.7 (*)    RDW 17.3 (*)    Platelets 106 (*)    All other components within normal limits  DIFFERENTIAL - Abnormal; Notable for the following components:   Neutro Abs 19.6 (*)    Monocytes Absolute 2.5 (*)    Basophils Absolute 0.5 (*)    All other components within normal limits  BRAIN NATRIURETIC PEPTIDE - Abnormal; Notable for the following components:   B Natriuretic Peptide 208.4 (*)    All other components within normal limits  URINE CULTURE  AMMONIA    URINALYSIS, ROUTINE W REFLEX MICROSCOPIC    EKG None  Radiology Dg Chest 2 View  Result Date: 12/09/2018 CLINICAL DATA:  Elevated ammonia levels, weakness, cough EXAM: CHEST - 2 VIEW COMPARISON:  11/19/2018 FINDINGS: The heart size and mediastinal contours are within normal limits. Both lungs are clear. The visualized skeletal structures are unremarkable. IMPRESSION: No active cardiopulmonary disease. Electronically Signed   By: Kathreen Devoid   On: 12/09/2018 15:15    Procedures Procedures (including critical care time)  Medications Ordered in ED Medications  sodium chloride flush (NS) 0.9 % injection 3 mL (has no administration in time range)  sodium chloride 0.9 % bolus 500 mL (has no administration in time range)     Initial Impression / Assessment and Plan / ED Course  I have reviewed the triage vital signs and the nursing notes.  Pertinent labs & imaging results that were available during my care of the patient were reviewed by me and considered in my medical decision making (see chart for details).    Patient presents today for concern of elevated ammonia from [facility].  She has reportedly been getting gradually more weak with more frequent falls over the past few months, however there has not been a acute or sudden change since her last CT head.    Her ammonia is normal, not elevated.  Labs show that her AST, ALT, alk phos and liver functions are all slightly worse than previous however not severely so.  Her chest x-ray does not show any evidence of acute abnormalities.  Her white count is elevated at 24.8, consistent with her history of CLL.  Her BNP is slightly elevated at 208 however I do not suspect that this is high enough to be causing her symptoms.  Her creatinine is slightly bumped  up to 1.73 from her previous 1.24.  P.o. hydration and 500 cc bolus given after chart review shows reasonable LVEF.      At this point urine, and EKG are pending.  He had was considered,  however patient states that her symptoms have not worsened since her last fall/CT head which is corroborated by her husband, and they do not feel like she has struck her head since.  It is my impression that this is been more of a gradual decline rather than a acute worsening.    Patient gets tearful when talking about going home, stating that if she goes home she is going to fall and "meet my end."  If remainder of work-up is unremarkable anticipate that patient will need boarding overnight for evaluation PT OT in the morning along with care management and social work consults.  At shift change care was transferred to [facility] who will follow pending studies, re-evaulate and determine disposition.      Final Clinical Impressions(s) / ED Diagnoses   Final diagnoses:  Weakness    ED Discharge Orders    None       Lorin Glass, PA-C 12/09/18 1638    Malvin Johns, MD 12/10/18 [phone]

## 2018-12-09 NOTE — H&P (Signed)
History and Physical    Karen Richard:[id number] DOB: August 07, 1927 DOA: 12/09/2018  PCP: Caren Macadam, MD Patient coming from: PCPs office for evaluation of elevated ammonia level, increased falls and confusion at home over the past month  HPI: Karen Richard is a 85 y.o. female with medical history significant of liver cirrhosis, CLL, CHF, hyperlipidemia, neuromuscular disorder presenting from PCPs office for evaluation of elevated ammonia level, increased falls and confusion at home over the past month.  Patient is a poor historian and it was difficult to obtain a thorough history from her.  Reported having generalized weakness and recent falls from her legs giving out.  Denies hitting her head or any other injuries from falls.  Denies losing consciousness.  Reports having bilateral lower extremity edema.  Denies having any cough, chest pain, dyspnea, or orthopnea.  Denies having any nausea, vomiting, or diarrhea.  No additional history could be obtained from the patient.  Review of Systems: As per HPI otherwise 10 point review of systems negative.  Past Medical History:  Diagnosis Date  . Anxiety   . Arthritis   . Cancer ([place])   . Cataract   . Hyperlipidemia   . Neuromuscular disorder ([place])   . Swelling in chest    rigth breast where mastectomy was performed     Past Surgical History:  Procedure Laterality Date  . ABDOMINAL HYSTERECTOMY    . APPENDECTOMY    . BREAST SURGERY    . CHOLECYSTECTOMY    . EYE SURGERY    . JOINT REPLACEMENT       reports that she has never smoked. She has never used smokeless tobacco. She reports that she does not drink alcohol or use drugs.  Allergies  Allergen Reactions  . Codeine Nausea Only  . Darvocet [Propoxyphene N-Acetaminophen] Nausea Only    Family History  Problem Relation Age of Onset  . Diabetes Mother   . Heart attack Mother   . Diabetes Father   . Heart attack Father     Prior to Admission medications     Medication Sig Start Date End Date Taking? Authorizing Provider  Acetaminophen (TYLENOL ARTHRITIS EXT RELIEF PO) Take 1 tablet by mouth as needed (pain).    Yes [provider]  ciprofloxacin (CIPRO) 500 MG tablet Take 500 mg by mouth 2 (two) times daily. 12/04/18  Yes [provider]  lactulose (CHRONULAC) 10 GM/15ML solution Take 30 mLs (20 g total) by mouth 3 (three) times daily. Patient taking differently: Take 15 g by mouth 3 (three) times daily.  11/27/18  Yes Clegg, Amy D, NP  LORazepam (ATIVAN) 0.5 MG tablet Take 1 tablet (0.5 mg total) by mouth daily as needed for anxiety. 11/21/18  Yes Nita Sells, MD  spironolactone (ALDACTONE) 25 MG tablet Take 1 tablet (25 mg total) by mouth daily. 11/13/18  Yes Patrecia Pour, MD  torsemide (DEMADEX) 20 MG tablet Take 1 tablet (20 mg total) by mouth daily. 11/25/18  Yes Clegg, Amy D, NP  Vitamin D, Ergocalciferol, (DRISDOL) 1.25 MG (50000 UT) CAPS capsule TAKE 1 CAPSULE ONCE A WEEK Patient taking differently: Take 50,000 Units by mouth every 7 (seven) days. Saturday 11/04/18  Yes Volanda Napoleon, MD    Physical Exam: Vitals:   12/09/18 1845 12/10/18 0215 12/10/18 0228 12/10/18 0425  BP: (!) 125/35 (!) 119/39 (!) 119/39 (!) 123/38  Pulse: (!) 102 (!) 110 (!) 110 (!) 107  Resp: 14 16 16 18   Temp:  97.8 F (  36.6 C) 97.8 F (36.6 C) 98.6 F (37 C)  TempSrc:  Oral Oral Oral  SpO2: 95% 97%  95%  Weight:   70.3 kg   Height:   5' 3"  (1.6 m)     Physical Exam  Constitutional: She is oriented to person, place, and time. No distress.  HENT:  Head: Normocephalic.  Mouth/Throat: Oropharynx is clear and moist.  Eyes: Right eye exhibits no discharge. Left eye exhibits no discharge.  Neck: Neck supple.  Cardiovascular: Normal rate, regular rhythm and intact distal pulses.  Pulmonary/Chest: Effort normal and breath sounds normal. No respiratory distress. She has no wheezes. She has no rales.  Abdominal: Soft. Bowel sounds are  normal. She exhibits no distension. There is no abdominal tenderness. There is no guarding.  Musculoskeletal:        General: Edema present.     Comments: +4 pedal edema bilaterally  Neurological: She is alert and oriented to person, place, and time.  Moving all extremities spontaneously.  No focal deficit.  Skin: Skin is warm and dry. She is not diaphoretic.     Labs on Admission: I have personally reviewed following labs and imaging studies  CBC: Recent Labs  Lab 12/09/18 1150 12/09/18 1325  WBC 24.8*  --   NEUTROABS  --  19.6*  HGB 12.5  --   HCT 35.7*  --   MCV 96.7  --   PLT 106*  --    Basic Metabolic Panel: Recent Labs  Lab 12/09/18 1150 12/10/18 0449  NA 130* 127*  K 5.0 4.4  CL 92* 90*  CO2 26 24  GLUCOSE 113* 88  BUN 35* 38*  CREATININE 1.73* 1.53*  CALCIUM 8.6* 8.6*   GFR: Estimated Creatinine Clearance: 23.5 mL/min (A) (by C-G formula based on SCr of 1.53 mg/dL (H)). Liver Function Tests: Recent Labs  Lab 12/09/18 1150  AST 202*  ALT 79*  ALKPHOS 268*  BILITOT 7.0*  PROT 6.0*  ALBUMIN 2.3*   No results for input(s): LIPASE, AMYLASE in the last 168 hours. Recent Labs  Lab 12/09/18 1200  AMMONIA 21   Coagulation Profile: No results for input(s): INR, PROTIME in the last 168 hours. Cardiac Enzymes: No results for input(s): CKTOTAL, CKMB, CKMBINDEX, TROPONINI in the last 168 hours. BNP (last 3 results) No results for input(s): PROBNP in the last 8760 hours. HbA1C: No results for input(s): HGBA1C in the last 72 hours. CBG: No results for input(s): GLUCAP in the last 168 hours. Lipid Profile: No results for input(s): CHOL, HDL, LDLCALC, TRIG, CHOLHDL, LDLDIRECT in the last 72 hours. Thyroid Function Tests: No results for input(s): TSH, T4TOTAL, FREET4, T3FREE, THYROIDAB in the last 72 hours. Anemia Panel: No results for input(s): VITAMINB12, FOLATE, FERRITIN, TIBC, IRON, RETICCTPCT in the last 72 hours. Urine analysis:    Component  Value Date/Time   COLORURINE YELLOW 12/09/2018 1731   APPEARANCEUR HAZY (A) 12/09/2018 1731   LABSPEC 1.017 12/09/2018 1731   PHURINE 6.0 12/09/2018 1731   GLUCOSEU 50 (A) 12/09/2018 1731   HGBUR SMALL (A) 12/09/2018 1731   HGBUR negative 02/01/2010 1012   BILIRUBINUR NEGATIVE 12/09/2018 1731   BILIRUBINUR neg 01/11/2011 1449   KETONESUR NEGATIVE 12/09/2018 1731   PROTEINUR 100 (A) 12/09/2018 1731   UROBILINOGEN 0.2 01/11/2011 1449   UROBILINOGEN 0.2 02/01/2010 1012   NITRITE NEGATIVE 12/09/2018 1731   LEUKOCYTESUR NEGATIVE 12/09/2018 1731    Radiological Exams on Admission: Dg Chest 2 View  Result Date: 12/09/2018 CLINICAL DATA:  Elevated  ammonia levels, weakness, cough EXAM: CHEST - 2 VIEW COMPARISON:  11/19/2018 FINDINGS: The heart size and mediastinal contours are within normal limits. Both lungs are clear. The visualized skeletal structures are unremarkable. IMPRESSION: No active cardiopulmonary disease. Electronically Signed   By: Kathreen Devoid   On: 12/09/2018 15:15   US Renal  Result Date: 12/10/2018 CLINICAL DATA:  Acute kidney injury EXAM: RENAL / URINARY TRACT ULTRASOUND COMPLETE COMPARISON:  CT 02/08/2016 FINDINGS: Right Kidney: Renal measurements: 8.5 x 5.0 x 4.0 cm = volume: 89 mL . Echogenicity within normal limits. No mass or hydronephrosis visualized. Left Kidney: Renal measurements: 10.2 x 5.1 x 4.7 cm = volume: 125 mL. Echogenicity within normal limits. No mass or hydronephrosis visualized. Bladder: Appears normal for degree of bladder distention. IMPRESSION: No acute findings.  No hydronephrosis. Electronically Signed   By: Rolm Baptise M.D.   On: 12/10/2018 02:06    EKG: Independently reviewed.  Regular rhythm (heart rate 100), a lot of artifact making it difficult to interpret.  LBBB similar to prior tracing.  Assessment/Plan Principal Problem:   Volume overload Active Problems:   CLL (chronic lymphocytic leukemia) (HCC)   Cirrhosis of liver without ascites  (HCC)   Acute encephalopathy   Poor tolerance for ambulation   Fall at home, initial encounter   Volume overload -Likely due to liver cirrhosis.  Recent echo showing EF 60 to 65%. -BNP 208, above baseline.  Lungs clear. -IV Lasix 20 mg once.  Continue to monitor renal function.  Difficulty with ambulation, fall -Likely secondary to significant bilateral lower extremity edema.  Will need IV diuresis after renal function improves. -No focal weakness -PT evaluation  Acute encephalopathy Does not appear confused at present.  Ammonia level normal.  UA not suggestive of infection.  Chest x-ray without evidence of pneumonia.  Patient is afebrile.  White count chronically elevated in the setting of history of CLL. -Continue home lactulose -Continue to monitor  Liver cirrhosis -LFTs elevated, chronic.  Appears volume overloaded.  Receiving IV Lasix. Hold home torsemide and spironolactone at this time.  Hyponatremia Sodium 130.  Likely secondary to cirrhosis and volume overload. -Continue to monitor BMP -IV diuresis as above  AKI on CKD 3 Creatinine 1.5.  Baseline creatinine 1.0.  Renal ultrasound without evidence of hydronephrosis or acute finding.  Worsening renal function possibly due to vascular congestion from volume overload. -Receiving IV Lasix 20 mg for volume overload. -Continue to monitor renal function -Check urine sodium, osmolarity  History of CLL -White count 24.8, chronically elevated.  Continue to monitor.  Chronic thrombocytopenia -Likely secondary to liver cirrhosis.  Platelet count 106,000.  No signs of active bleeding.  Continue to monitor.  DVT prophylaxis: Subcutaneous heparin Code Status: DNR.  Discussed with the patient. Family Communication: No family available. Disposition Plan: Anticipate discharge after clinical improvement. Consults called: None Admission status: Observation   Shela Leff MD [facility] Pager [phone]  If  7PM-7AM, please contact night-coverage [email or web address] Password [facility]  12/10/2018, 7:15 AM

## 2018-12-09 NOTE — ED Notes (Signed)
Pt drinking ginger ale, tolerating well.

## 2018-12-10 ENCOUNTER — Other Ambulatory Visit: Payer: Self-pay

## 2018-12-10 ENCOUNTER — Observation Stay (HOSPITAL_COMMUNITY): Payer: Medicare Other

## 2018-12-10 DIAGNOSIS — Y92009 Unspecified place in unspecified non-institutional (private) residence as the place of occurrence of the external cause: Secondary | ICD-10-CM

## 2018-12-10 DIAGNOSIS — C9110 Chronic lymphocytic leukemia of B-cell type not having achieved remission: Secondary | ICD-10-CM

## 2018-12-10 DIAGNOSIS — G9340 Encephalopathy, unspecified: Secondary | ICD-10-CM | POA: Diagnosis not present

## 2018-12-10 DIAGNOSIS — K7460 Unspecified cirrhosis of liver: Secondary | ICD-10-CM | POA: Diagnosis not present

## 2018-12-10 DIAGNOSIS — Z789 Other specified health status: Secondary | ICD-10-CM

## 2018-12-10 DIAGNOSIS — E8770 Fluid overload, unspecified: Secondary | ICD-10-CM | POA: Diagnosis not present

## 2018-12-10 DIAGNOSIS — W19XXXA Unspecified fall, initial encounter: Secondary | ICD-10-CM

## 2018-12-10 LAB — BASIC METABOLIC PANEL
Anion gap: 13 (ref 5–15)
BUN: 38 mg/dL — ABNORMAL HIGH (ref 8–23)
CO2: 24 mmol/L (ref 22–32)
CREATININE: 1.53 mg/dL — AB (ref 0.44–1.00)
Calcium: 8.6 mg/dL — ABNORMAL LOW (ref 8.9–10.3)
Chloride: 90 mmol/L — ABNORMAL LOW (ref 98–111)
GFR calc Af Amer: 35 mL/min — ABNORMAL LOW (ref >60)
GFR calc non Af Amer: 30 mL/min — ABNORMAL LOW (ref >60)
Glucose, Bld: 88 mg/dL (ref 70–99)
Potassium: 4.4 mmol/L (ref 3.5–5.1)
Sodium: 127 mmol/L — ABNORMAL LOW (ref 135–145)

## 2018-12-10 LAB — CBC
HEMATOCRIT: 30.9 % — AB (ref 36.0–46.0)
Hemoglobin: 11 g/dL — ABNORMAL LOW (ref 12.0–15.0)
MCH: 33.5 pg (ref 26.0–34.0)
MCHC: 35.6 g/dL (ref 30.0–36.0)
MCV: 94.2 fL (ref 80.0–100.0)
Platelets: 95 10*3/uL — ABNORMAL LOW (ref 150–400)
RBC: 3.28 MIL/uL — ABNORMAL LOW (ref 3.87–5.11)
RDW: 17.1 % — ABNORMAL HIGH (ref 11.5–15.5)
WBC: 20.5 10*3/uL — ABNORMAL HIGH (ref 4.0–10.5)
nRBC: 0.4 % — ABNORMAL HIGH (ref 0.0–0.2)

## 2018-12-10 LAB — OSMOLALITY: Osmolality: 279 mOsm/kg (ref 275–295)

## 2018-12-10 LAB — SODIUM, URINE, RANDOM: Sodium, Ur: 204 mmol/L

## 2018-12-10 LAB — OSMOLALITY, URINE: Osmolality, Ur: 647 mOsm/kg (ref 300–900)

## 2018-12-10 MED ORDER — FUROSEMIDE 10 MG/ML IJ SOLN
20.0000 mg | Freq: Two times a day (BID) | INTRAMUSCULAR | Status: DC
  Administered 2018-12-10 (×2): 20 mg via INTRAVENOUS
  Filled 2018-12-10 (×2): qty 4

## 2018-12-10 MED ORDER — LACTULOSE 10 GM/15ML PO SOLN
15.0000 g | Freq: Three times a day (TID) | ORAL | Status: DC
  Administered 2018-12-10 – 2018-12-16 (×14): 15 g via ORAL
  Filled 2018-12-10 (×16): qty 30

## 2018-12-10 MED ORDER — HEPARIN SODIUM (PORCINE) 5000 UNIT/ML IJ SOLN
5000.0000 [IU] | Freq: Three times a day (TID) | INTRAMUSCULAR | Status: DC
  Administered 2018-12-10 – 2018-12-11 (×5): 5000 [IU] via SUBCUTANEOUS
  Filled 2018-12-10 (×5): qty 1

## 2018-12-10 MED ORDER — FUROSEMIDE 10 MG/ML IJ SOLN: 20.0000 mg | Freq: Once | INTRAMUSCULAR | Status: DC

## 2018-12-10 NOTE — Care Management Note (Signed)
Case Management Note  Patient Details  Name: YUNUEN MORDAN MRN: [id number] Date of Birth: 09/20/1927  Subjective/Objective:     Pt in with volume overload. She is from [place] with her spouse. DME: rollator, shower seat and bars No issues obtaining home meds.  Denies issues with transportation.               Action/Plan: Awaiting PT eval. CM following for d/c needs, physician orders.  Expected Discharge Date:                  Expected Discharge Plan:     In-House Referral:     Discharge planning Services     Post Acute Care Choice:    Choice offered to:     DME Arranged:    DME Agency:     HH Arranged:    HH Agency:     Status of Service:  In process, will continue to follow  If discussed at Long Length of Stay Meetings, dates discussed:    Additional Comments:  Pollie Friar, RN 12/10/2018, 12:58 PM

## 2018-12-10 NOTE — Progress Notes (Signed)
PROGRESS NOTE    Karen Richard  WPY:[id number] DOB: 1927/03/02 DOA: 12/09/2018 PCP: Caren Macadam, MD   Brief Narrative:  Per admitting MD: Karen Richard is a 85 y.o. female with medical history significant of liver cirrhosis, CLL, CHF, hyperlipidemia, neuromuscular disorder presenting from PCPs office for evaluation of elevated ammonia level, increased falls and confusion at home over the past month.  Patient is a poor historian and it was difficult to obtain a thorough history from her.  Reported having generalized weakness and recent falls from her legs giving out.  Denies hitting her head or any other injuries from falls.  Denies losing consciousness.  Reports having bilateral lower extremity edema.  Denies having any cough, chest pain, dyspnea, or orthopnea.  Denies having any nausea, vomiting, or diarrhea.  No additional history could be obtained from the patient.   Assessment & Plan:   Principal Problem:   Volume overload Active Problems:   CLL (chronic lymphocytic leukemia) (HCC)   Cirrhosis of liver without ascites (HCC)   Acute encephalopathy   Poor tolerance for ambulation   Fall at home, initial encounter   Volume overload -Likely due to liver cirrhosis.  Recent echo showing EF 60 to 65%. -BNP 208, above baseline.  Lungs remain clear -IV Lasix 20 mg twice daily, added strict I's and O's and daily weights.  Continue to monitor renal function.  Difficulty with ambulation, fall -Likely secondary to significant bilateral lower extremity edema.   -No focal weakness noted -PT evaluation currently pending  Acute encephalopathy This appears to been resolved.  Patient answering questions appropriately without any confusion noted.  Previously noted: Ammonia level normal.  UA not suggestive of infection.  Chest x-ray without evidence of pneumonia.  Patient is afebrile.  White count chronically elevated in the setting of history of CLL. -Continue home  lactulose -Continue to monitor  Liver cirrhosis -LFTs elevated, chronic.  Appears volume overloaded.    As above receiving IV Lasix. Hold home torsemide and spironolactone at this time reinitiate on discharge.  Hyponatremia Sodium 127, ordered serum osmoles which are currently pending.  Likely secondary to cirrhosis and volume overload. -Continue to monitor BMP recheck today at 1400 -IV diuresis as above  AKI on CKD 3 Creatinine 1.5.  Baseline creatinine 1.0.  Renal ultrasound without evidence of hydronephrosis or acute finding.  Worsening renal function possibly due to vascular congestion from volume overload. -Receiving IV Lasix 20 mg for volume overload. -Continue to monitor renal function -Check renal ultrasound  History of CLL -White count 24.8>20.5, chronically elevated.  Continue to monitor.  Chronic thrombocytopenia -Likely secondary to liver cirrhosis.  Platelet count 106>95  No signs of active bleeding.  Continue to monitor.  DVT prophylaxis: Heparin SQ  Code Status: DNR    Code Status Orders  (From admission, onward)         Start     Ordered   12/10/18 0054  Do not attempt resuscitation (DNR)  Continuous    Question Answer Comment  In the event of cardiac or respiratory ARREST Do not call a "code blue"   In the event of cardiac or respiratory ARREST Do not perform Intubation, CPR, defibrillation or ACLS   In the event of cardiac or respiratory ARREST Use medication by any route, position, wound care, and other measures to relive pain and suffering. May use oxygen, suction and manual treatment of airway obstruction as needed for comfort.      12/10/18 0055  Code Status History    Date Active Date Inactive Code Status Order ID Comments User Context   11/19/2018 2305 11/21/2018 1547 DNR [id number]  Rise Patience, MD ED   11/04/2018 1935 11/12/2018 1837 DNR [id number]  Shela Leff, MD ED   09/05/2017 2212 09/08/2017 1448 DNR [id number]  Damita Lack, MD Inpatient    Advance Directive Documentation     Most Recent Value  Type of Advance Directive  Healthcare Power of Attorney, Living will  Pre-existing out of facility DNR order (yellow form or pink MOST form)  -  "MOST" Form in Place?  -     Family Communication: None present Disposition Plan:   Likely home Consults called: None Admission status: Observation   Consultants:   None  Procedures:  Dg Chest 2 View  Result Date: 12/09/2018 CLINICAL DATA:  Elevated ammonia levels, weakness, cough EXAM: CHEST - 2 VIEW COMPARISON:  11/19/2018 FINDINGS: The heart size and mediastinal contours are within normal limits. Both lungs are clear. The visualized skeletal structures are unremarkable. IMPRESSION: No active cardiopulmonary disease. Electronically Signed   By: Kathreen Devoid   On: 12/09/2018 15:15   Dg Chest 2 View  Result Date: 11/19/2018 CLINICAL DATA:  Overall weakness since yesterday. EXAM: CHEST - 2 VIEW COMPARISON:  11/15/2018 FINDINGS: The heart size and mediastinal contours are within normal limits. Nonaneurysmal atherosclerotic aorta. Both lungs are clear. The visualized skeletal structures are unremarkable. IMPRESSION: No active cardiopulmonary disease.  Aortic atherosclerosis. Electronically Signed   By: Ashley Royalty M.D.   On: 11/19/2018 16:17   Dg Chest 2 View  Result Date: 11/15/2018 CLINICAL DATA:  Weakness over the last month. Falling. EXAM: CHEST - 2 VIEW COMPARISON:  11/04/2018 FINDINGS: Artifact overlies the chest. Heart size upper limits of normal. Chronic aortic atherosclerosis. Chronic linear scar the lung. No evidence of infiltrate, collapse or effusion. No sign of heart failure. No significant bone finding. IMPRESSION: No active disease. Aortic atherosclerosis. Electronically Signed   By: Nelson Chimes M.D.   On: 11/15/2018 09:55   Ct Head Wo Contrast  Result Date: 11/19/2018 CLINICAL DATA:  Increased confusion.  Fall 4 days ago. EXAM: CT HEAD WITHOUT  CONTRAST TECHNIQUE: Contiguous axial images were obtained from the base of the skull through the vertex without intravenous contrast. COMPARISON:  11/15/2018 FINDINGS: Brain: Mild global atrophy is appropriate to age. Mild chronic ischemic changes in the periventricular white matter. There is no mass effect, midline shift, or acute hemorrhage. Vascular: No hyperdense vessel or unexpected calcification. Skull: The cranium is intact. Sinuses/Orbits: Orbits are within normal limits. Visualized paranasal sinuses and mastoid air cells are clear. Other: Noncontributory. IMPRESSION: No acute intracranial pathology. Electronically Signed   By: Marybelle Killings M.D.   On: 11/19/2018 19:53   Ct Head Wo Contrast  Result Date: 11/15/2018 CLINICAL DATA:  Weakness and two falls. Patient stated she hit the back of her head. Denies pain EXAM: CT HEAD WITHOUT CONTRAST TECHNIQUE: Contiguous axial images were obtained from the base of the skull through the vertex without intravenous contrast. COMPARISON:  None. FINDINGS: Brain: No evidence of acute infarction, hemorrhage, hydrocephalus, extra-axial collection or mass lesion/mass effect. Mild patchy areas of white matter hypoattenuation consistent with chronic microvascular ischemic change. Vascular: No hyperdense vessel or unexpected calcification. Skull: Normal. Negative for fracture or focal lesion. Sinuses/Orbits: Globes and orbits are unremarkable. The visualized sinuses and mastoid air cells are clear. Other: None. IMPRESSION: 1. No acute intracranial abnormalities. 2. Mild chronic microvascular ischemic change.  Electronically Signed   By: Lajean Manes M.D.   On: 11/15/2018 09:47   US Renal  Result Date: 12/10/2018 CLINICAL DATA:  Acute kidney injury EXAM: RENAL / URINARY TRACT ULTRASOUND COMPLETE COMPARISON:  CT 02/08/2016 FINDINGS: Right Kidney: Renal measurements: 8.5 x 5.0 x 4.0 cm = volume: 89 mL . Echogenicity within normal limits. No mass or hydronephrosis visualized.  Left Kidney: Renal measurements: 10.2 x 5.1 x 4.7 cm = volume: 125 mL. Echogenicity within normal limits. No mass or hydronephrosis visualized. Bladder: Appears normal for degree of bladder distention. IMPRESSION: No acute findings.  No hydronephrosis. Electronically Signed   By: Rolm Baptise M.D.   On: 12/10/2018 02:06     Antimicrobials:   none    Subjective: Patient is mental status is improved significantly and she appears to be at baseline this morning.  Still significant lower extremity edema.  Objective: Vitals:   12/10/18 0215 12/10/18 0228 12/10/18 0425 12/10/18 0735  BP: (!) 119/39 (!) 119/39 (!) 123/38 (!) 123/35  Pulse: (!) 110 (!) 110 (!) 107 (!) 105  Resp: 16 16 18 18   Temp: 97.8 F (36.6 C) 97.8 F (36.6 C) 98.6 F (37 C) 98.7 F (37.1 C)  TempSrc: Oral Oral Oral Oral  SpO2: 97%  95% 97%  Weight:  70.3 kg    Height:  5' 3"  (1.6 m)      Intake/Output Summary (Last 24 hours) at 12/10/2018 1100 Last data filed at 12/10/2018 0735 Gross per 24 hour  Intake 100 ml  Output 500 ml  Net -400 ml   Filed Weights   12/10/18 0228  Weight: 70.3 kg    Examination:  General exam: Appears calm and comfortable  Respiratory system: Clear to auscultation. Respiratory effort normal. Cardiovascular system: S1 & S2 heard, RRR. No JVD, murmurs, rubs, gallops or clicks. No pedal edema. Gastrointestinal system: Abdomen is nondistended, soft and nontender. No organomegaly or masses noted. Normal bowel sounds heard. Central nervous system: Alert and oriented. No focal neurological deficits.mental status seems at baseline Extremities: Symmetric 5 x 5 power 2+ pitting edema bilateral lower extremities,. Skin: No rashes, lesions or ulcers Psychiatry: Judgement and insight appear normal. Mood & affect appropriate.     Data Reviewed: I have personally reviewed following labs and imaging studies  CBC: Recent Labs  Lab 12/09/18 1150 12/09/18 1325 12/10/18 0655  WBC 24.8*  --   20.5*  NEUTROABS  --  19.6*  --   HGB 12.5  --  11.0*  HCT 35.7*  --  30.9*  MCV 96.7  --  94.2  PLT 106*  --  95*   Basic Metabolic Panel: Recent Labs  Lab 12/09/18 1150 12/10/18 0449  NA 130* 127*  K 5.0 4.4  CL 92* 90*  CO2 26 24  GLUCOSE 113* 88  BUN 35* 38*  CREATININE 1.73* 1.53*  CALCIUM 8.6* 8.6*   GFR: Estimated Creatinine Clearance: 23.5 mL/min (A) (by C-G formula based on SCr of 1.53 mg/dL (H)). Liver Function Tests: Recent Labs  Lab 12/09/18 1150  AST 202*  ALT 79*  ALKPHOS 268*  BILITOT 7.0*  PROT 6.0*  ALBUMIN 2.3*   No results for input(s): LIPASE, AMYLASE in the last 168 hours. Recent Labs  Lab 12/09/18 1200  AMMONIA 21   Coagulation Profile: No results for input(s): INR, PROTIME in the last 168 hours. Cardiac Enzymes: No results for input(s): CKTOTAL, CKMB, CKMBINDEX, TROPONINI in the last 168 hours. BNP (last 3 results) No results for input(s):  PROBNP in the last 8760 hours. HbA1C: No results for input(s): HGBA1C in the last 72 hours. CBG: No results for input(s): GLUCAP in the last 168 hours. Lipid Profile: No results for input(s): CHOL, HDL, LDLCALC, TRIG, CHOLHDL, LDLDIRECT in the last 72 hours. Thyroid Function Tests: No results for input(s): TSH, T4TOTAL, FREET4, T3FREE, THYROIDAB in the last 72 hours. Anemia Panel: No results for input(s): VITAMINB12, FOLATE, FERRITIN, TIBC, IRON, RETICCTPCT in the last 72 hours. Sepsis Labs: No results for input(s): PROCALCITON, LATICACIDVEN in the last 168 hours.  No results found for this or any previous visit (from the past 240 hour(s)).       Radiology Studies: Dg Chest 2 View  Result Date: 12/09/2018 CLINICAL DATA:  Elevated ammonia levels, weakness, cough EXAM: CHEST - 2 VIEW COMPARISON:  11/19/2018 FINDINGS: The heart size and mediastinal contours are within normal limits. Both lungs are clear. The visualized skeletal structures are unremarkable. IMPRESSION: No active cardiopulmonary  disease. Electronically Signed   By: Kathreen Devoid   On: 12/09/2018 15:15   US Renal  Result Date: 12/10/2018 CLINICAL DATA:  Acute kidney injury EXAM: RENAL / URINARY TRACT ULTRASOUND COMPLETE COMPARISON:  CT 02/08/2016 FINDINGS: Right Kidney: Renal measurements: 8.5 x 5.0 x 4.0 cm = volume: 89 mL . Echogenicity within normal limits. No mass or hydronephrosis visualized. Left Kidney: Renal measurements: 10.2 x 5.1 x 4.7 cm = volume: 125 mL. Echogenicity within normal limits. No mass or hydronephrosis visualized. Bladder: Appears normal for degree of bladder distention. IMPRESSION: No acute findings.  No hydronephrosis. Electronically Signed   By: Rolm Baptise M.D.   On: 12/10/2018 02:06        Scheduled Meds: . furosemide  20 mg Intravenous BID  . heparin  5,000 Units Subcutaneous Q8H  . lactulose  15 g Oral TID   Continuous Infusions:   LOS: 0 days    Time spent: 35 min    Nicolette Bang, MD [facility]  If 7PM-7AM, please contact night-coverage  12/10/2018, 11:00 AM

## 2018-12-10 NOTE — Care Management Obs Status (Signed)
[facility] NOTIFICATION   Patient Details  Name: Karen Richard MRN: [id number] Date of Birth: May 28, 1927   Medicare Observation Status Notification Given:  Yes    Pollie Friar, RN 12/10/2018, 11:44 AM

## 2018-12-11 ENCOUNTER — Other Ambulatory Visit: Payer: Medicare Other

## 2018-12-11 DIAGNOSIS — Z9660 Presence of unspecified orthopedic joint implant: Secondary | ICD-10-CM | POA: Diagnosis present

## 2018-12-11 DIAGNOSIS — C9110 Chronic lymphocytic leukemia of B-cell type not having achieved remission: Secondary | ICD-10-CM | POA: Diagnosis not present

## 2018-12-11 DIAGNOSIS — K7200 Acute and subacute hepatic failure without coma: Secondary | ICD-10-CM | POA: Diagnosis present

## 2018-12-11 DIAGNOSIS — G2581 Restless legs syndrome: Secondary | ICD-10-CM | POA: Diagnosis present

## 2018-12-11 DIAGNOSIS — Z8249 Family history of ischemic heart disease and other diseases of the circulatory system: Secondary | ICD-10-CM | POA: Diagnosis not present

## 2018-12-11 DIAGNOSIS — I5032 Chronic diastolic (congestive) heart failure: Secondary | ICD-10-CM | POA: Diagnosis present

## 2018-12-11 DIAGNOSIS — M1990 Unspecified osteoarthritis, unspecified site: Secondary | ICD-10-CM | POA: Diagnosis present

## 2018-12-11 DIAGNOSIS — K7460 Unspecified cirrhosis of liver: Secondary | ICD-10-CM | POA: Diagnosis not present

## 2018-12-11 DIAGNOSIS — R188 Other ascites: Secondary | ICD-10-CM | POA: Diagnosis present

## 2018-12-11 DIAGNOSIS — K7210 Chronic hepatic failure without coma: Secondary | ICD-10-CM | POA: Diagnosis not present

## 2018-12-11 DIAGNOSIS — Z833 Family history of diabetes mellitus: Secondary | ICD-10-CM | POA: Diagnosis not present

## 2018-12-11 DIAGNOSIS — R531 Weakness: Secondary | ICD-10-CM | POA: Diagnosis present

## 2018-12-11 DIAGNOSIS — K7581 Nonalcoholic steatohepatitis (NASH): Secondary | ICD-10-CM | POA: Diagnosis present

## 2018-12-11 DIAGNOSIS — N179 Acute kidney failure, unspecified: Secondary | ICD-10-CM | POA: Diagnosis present

## 2018-12-11 DIAGNOSIS — K7290 Hepatic failure, unspecified without coma: Secondary | ICD-10-CM

## 2018-12-11 DIAGNOSIS — I447 Left bundle-branch block, unspecified: Secondary | ICD-10-CM | POA: Diagnosis present

## 2018-12-11 DIAGNOSIS — E46 Unspecified protein-calorie malnutrition: Secondary | ICD-10-CM | POA: Diagnosis present

## 2018-12-11 DIAGNOSIS — Z885 Allergy status to narcotic agent status: Secondary | ICD-10-CM | POA: Diagnosis not present

## 2018-12-11 DIAGNOSIS — D696 Thrombocytopenia, unspecified: Secondary | ICD-10-CM | POA: Diagnosis present

## 2018-12-11 DIAGNOSIS — Z9011 Acquired absence of right breast and nipple: Secondary | ICD-10-CM | POA: Diagnosis not present

## 2018-12-11 DIAGNOSIS — N183 Chronic kidney disease, stage 3 (moderate): Secondary | ICD-10-CM | POA: Diagnosis present

## 2018-12-11 DIAGNOSIS — W19XXXA Unspecified fall, initial encounter: Secondary | ICD-10-CM | POA: Diagnosis not present

## 2018-12-11 DIAGNOSIS — E8779 Other fluid overload: Secondary | ICD-10-CM | POA: Diagnosis present

## 2018-12-11 DIAGNOSIS — F419 Anxiety disorder, unspecified: Secondary | ICD-10-CM | POA: Diagnosis present

## 2018-12-11 DIAGNOSIS — Z66 Do not resuscitate: Secondary | ICD-10-CM | POA: Diagnosis present

## 2018-12-11 DIAGNOSIS — E8770 Fluid overload, unspecified: Secondary | ICD-10-CM | POA: Diagnosis not present

## 2018-12-11 DIAGNOSIS — E785 Hyperlipidemia, unspecified: Secondary | ICD-10-CM | POA: Diagnosis present

## 2018-12-11 DIAGNOSIS — G9340 Encephalopathy, unspecified: Secondary | ICD-10-CM | POA: Diagnosis not present

## 2018-12-11 DIAGNOSIS — E871 Hypo-osmolality and hyponatremia: Secondary | ICD-10-CM | POA: Diagnosis present

## 2018-12-11 LAB — IRON AND TIBC
Iron: 206 ug/dL — ABNORMAL HIGH (ref 28–170)
Saturation Ratios: 90 % — ABNORMAL HIGH (ref 10.4–31.8)
TIBC: 230 ug/dL — ABNORMAL LOW (ref 250–450)
UIBC: 24 ug/dL

## 2018-12-11 LAB — FERRITIN: FERRITIN: 1427 ng/mL — AB (ref 11–307)

## 2018-12-11 LAB — PROTIME-INR
INR: 1.67
Prothrombin Time: 19.5 seconds — ABNORMAL HIGH (ref 11.4–15.2)

## 2018-12-11 MED ORDER — SPIRONOLACTONE 25 MG PO TABS
50.0000 mg | ORAL_TABLET | Freq: Every day | ORAL | Status: DC
  Administered 2018-12-11 – 2018-12-14 (×4): 50 mg via ORAL
  Filled 2018-12-11 (×5): qty 2

## 2018-12-11 MED ORDER — HEPARIN SODIUM (PORCINE) 5000 UNIT/ML IJ SOLN
5000.0000 [IU] | Freq: Three times a day (TID) | INTRAMUSCULAR | Status: DC
  Administered 2018-12-12 – 2018-12-17 (×14): 5000 [IU] via SUBCUTANEOUS
  Filled 2018-12-11 (×14): qty 1

## 2018-12-11 NOTE — Evaluation (Signed)
Physical Therapy Evaluation Patient Details Name: Karen Richard MRN: [id number] DOB: [birth year] Today's Date: 12/11/2018   History of Present Illness  Pt is an 85 y/o female admitted secondary to falls at home and found to be in volume overload. PMH including but not limited to nonalcoholic liver cirrhosis, CLL, heart failure, dyslipidemia and neuromuscular disorder.     Clinical Impression  Pt presented supine in bed with HOB elevated, awake and willing to participate in therapy session. Prior to admission, pt reported that she has recently been using a rollator to ambulate. Pt currently limited secondary to fatigue and generalized weakness. Pt would continue to benefit from skilled physical therapy services at this time while admitted and after d/c to address the below listed limitations in order to improve overall safety and independence with functional mobility.     Follow Up Recommendations SNF    Equipment Recommendations  None recommended by PT    Recommendations for Other Services       Precautions / Restrictions Precautions Precautions: Fall Restrictions Weight Bearing Restrictions: No      Mobility  Bed Mobility Overal bed mobility: Needs Assistance Bed Mobility: Supine to Sit     Supine to sit: Min guard     General bed mobility comments: HOB elevated, min guard for safety  Transfers Overall transfer level: Needs assistance Equipment used: Rolling walker (2 wheeled) Transfers: Sit to/from Stand Sit to Stand: Min guard         General transfer comment: cueing for safe hand placement, min guard for safety as pt with reported dizziness initially that quickly dissipated  Ambulation/Gait Ambulation/Gait assistance: Min guard Gait Distance (Feet): 15 Feet Assistive device: Rolling walker (2 wheeled) Gait Pattern/deviations: Step-through pattern;Decreased step length - left;Decreased step length - right;Decreased stride length;Shuffle Gait velocity:  decreased   General Gait Details: pt with very slow gait with RW, mild instability but no overt LOB; however, pt significantly limited secondary to fatigue and weakness  Stairs            Wheelchair Mobility    Modified Rankin (Stroke Patients Only)       Balance Overall balance assessment: Needs assistance Sitting-balance support: Feet supported Sitting balance-Leahy Scale: Good     Standing balance support: Bilateral upper extremity supported Standing balance-Leahy Scale: Poor                               Pertinent Vitals/Pain Pain Assessment: No/denies pain    Home Living Family/patient expects to be discharged to:: Private residence Living Arrangements: Spouse/significant other Available Help at Discharge: Family;Available 24 hours/day Type of Home: Independent living facility Home Access: Level entry     Home Layout: One level Home Equipment: Walker - 2 wheels;Walker - 4 wheels      Prior Function Level of Independence: Independent with assistive device(s)         Comments: pt ambulates with rollator     Hand Dominance        Extremity/Trunk Assessment   Upper Extremity Assessment Upper Extremity Assessment: Generalized weakness    Lower Extremity Assessment Lower Extremity Assessment: Generalized weakness       Communication   Communication: HOH  Cognition Arousal/Alertness: Awake/alert Behavior During Therapy: Flat affect Overall Cognitive Status: Impaired/Different from baseline Area of Impairment: Safety/judgement;Problem solving  Safety/Judgement: Decreased awareness of safety;Decreased awareness of deficits   Problem Solving: Decreased initiation;Requires verbal cues        General Comments      Exercises     Assessment/Plan    PT Assessment Patient needs continued PT services  PT Problem List Decreased strength;Decreased activity tolerance;Decreased balance;Decreased  mobility;Decreased coordination;Decreased knowledge of use of DME;Decreased safety awareness;Decreased knowledge of precautions       PT Treatment Interventions DME instruction;Gait training;Stair training;Functional mobility training;Therapeutic activities;Therapeutic exercise;Balance training;Neuromuscular re-education;Patient/family education;Cognitive remediation    PT Goals (Current goals can be found in the Care Plan section)  Acute Rehab PT Goals Patient Stated Goal: "I don't want to do too much! I'm really weak!" PT Goal Formulation: With patient Time For Goal Achievement: 12/25/18 Potential to Achieve Goals: Good    Frequency Min 2X/week   Barriers to discharge        Co-evaluation               AM-PAC PT "6 Clicks" Mobility  Outcome Measure Help needed turning from your back to your side while in a flat bed without using bedrails?: None Help needed moving from lying on your back to sitting on the side of a flat bed without using bedrails?: None Help needed moving to and from a bed to a chair (including a wheelchair)?: A Little Help needed standing up from a chair using your arms (e.g., wheelchair or bedside chair)?: A Little Help needed to walk in hospital room?: A Little Help needed climbing 3-5 steps with a railing? : A Lot 6 Click Score: 19    End of Session Equipment Utilized During Treatment: Gait belt Activity Tolerance: Patient limited by fatigue Patient left: in chair;with call bell/phone within reach;with chair alarm set Nurse Communication: Mobility status PT Visit Diagnosis: Other abnormalities of gait and mobility (R26.89);Muscle weakness (generalized) (M62.81)    Time: 1438-8875 PT Time Calculation (min) (ACUTE ONLY): 17 min   Charges:   PT Evaluation $PT Eval Moderate Complexity: 1 Mod          Sherie Don, PT, DPT  Acute Rehabilitation Services Pager [phone] Office [place] 12/11/2018, 3:43  PM

## 2018-12-11 NOTE — Consult Note (Signed)
Chief Complaint: Patient was seen in consultation today for nonalcoholic liver cirrhosis  Referring Physician(s): Dr. Therisa Doyne  Supervising Physician: Jacqulynn Cadet  Patient Status: [facility] - In-pt  History of Present Illness: Karen Richard is a 85 y.o. female with past medical history of anxiety, arthritis, HLD who presented to [facility] ED from PCP after a fall at home. She has had elevated ammonia levels, frequent falls, and intermittent confusion for the past 1 month. Her labs shows elevated ferritin and iron saturation levels.  She has been evaluated by GI who requests a liver biopsy.   Past Medical History:  Diagnosis Date  . Anxiety   . Arthritis   . Cancer ([place])   . Cataract   . Hyperlipidemia   . Neuromuscular disorder ([place])   . Swelling in chest    rigth breast where mastectomy was performed     Past Surgical History:  Procedure Laterality Date  . ABDOMINAL HYSTERECTOMY    . APPENDECTOMY    . BREAST SURGERY    . CHOLECYSTECTOMY    . EYE SURGERY    . JOINT REPLACEMENT      Allergies: Codeine and Darvocet [propoxyphene n-acetaminophen]  Medications: Prior to Admission medications   Medication Sig Start Date End Date Taking? Authorizing Provider  Acetaminophen (TYLENOL ARTHRITIS EXT RELIEF PO) Take 1 tablet by mouth as needed (pain).    Yes [provider]  ciprofloxacin (CIPRO) 500 MG tablet Take 500 mg by mouth 2 (two) times daily. 12/04/18  Yes [provider]  lactulose (CHRONULAC) 10 GM/15ML solution Take 30 mLs (20 g total) by mouth 3 (three) times daily. Patient taking differently: Take 15 g by mouth 3 (three) times daily.  11/27/18  Yes Clegg, Amy D, NP  LORazepam (ATIVAN) 0.5 MG tablet Take 1 tablet (0.5 mg total) by mouth daily as needed for anxiety. 11/21/18  Yes Nita Sells, MD  spironolactone (ALDACTONE) 25 MG tablet Take 1 tablet (25 mg total) by mouth daily. 11/13/18  Yes Patrecia Pour, MD  torsemide (DEMADEX) 20 MG tablet  Take 1 tablet (20 mg total) by mouth daily. 11/25/18  Yes Clegg, Amy D, NP  Vitamin D, Ergocalciferol, (DRISDOL) 1.25 MG (50000 UT) CAPS capsule TAKE 1 CAPSULE ONCE A WEEK Patient taking differently: Take 50,000 Units by mouth every 7 (seven) days. Saturday 11/04/18  Yes Volanda Napoleon, MD     Family History  Problem Relation Age of Onset  . Diabetes Mother   . Heart attack Mother   . Diabetes Father   . Heart attack Father     Social History   Socioeconomic History  . Marital status: Married    Spouse name: Not on file  . Number of children: Not on file  . Years of education: Not on file  . Highest education level: Not on file  Occupational History  . Not on file  Social Needs  . Financial resource strain: Not on file  . Food insecurity:    Worry: Not on file    Inability: Not on file  . Transportation needs:    Medical: Not on file    Non-medical: Not on file  Tobacco Use  . Smoking status: Never Smoker  . Smokeless tobacco: Never Used  . Tobacco comment: never used tobacco  Substance and Sexual Activity  . Alcohol use: No    Alcohol/week: 0.0 standard drinks  . Drug use: No  . Sexual activity: Not Currently  Lifestyle  . Physical activity:  Days per week: Not on file    Minutes per session: Not on file  . Stress: Not on file  Relationships  . Social connections:    Talks on phone: Not on file    Gets together: Not on file    Attends religious service: Not on file    Active member of club or organization: Not on file    Attends meetings of clubs or organizations: Not on file    Relationship status: Not on file  Other Topics Concern  . Not on file  Social History Narrative  . Not on file     Review of Systems: A 12 point ROS discussed and pertinent positives are indicated in the HPI above.  All other systems are negative.  Review of Systems  Constitutional: Positive for fatigue. Negative for fever.  Respiratory: Negative for cough and shortness of  breath.   Cardiovascular: Negative for chest pain.  Gastrointestinal: Negative for abdominal pain, diarrhea and nausea.  Musculoskeletal: Negative for back pain.  Neurological: Negative for dizziness and light-headedness.  Psychiatric/Behavioral: Positive for confusion. Negative for behavioral problems.    Vital Signs: BP (!) 108/47   Pulse 89   Temp 97.9 F (36.6 C)   Resp 18   Ht _0  (1.6 m)   Wt 155 lb (70.3 kg)   SpO2 98%   BMI 27.46 kg/m   Physical Exam Vitals signs and nursing note reviewed.  Constitutional:      Appearance: Normal appearance.  Cardiovascular:     Rate and Rhythm: Normal rate and regular rhythm.     Heart sounds: No murmur. No gallop.   Pulmonary:     Effort: Pulmonary effort is normal. No respiratory distress.     Breath sounds: Normal breath sounds.  Abdominal:     General: Abdomen is flat. There is no distension.     Palpations: Abdomen is soft.     Tenderness: There is no abdominal tenderness.  Skin:    General: Skin is warm and dry.  Neurological:     General: No focal deficit present.     Mental Status: She is alert and oriented to person, place, and time.  Psychiatric:        Mood and Affect: Mood normal.        Behavior: Behavior normal.        Thought Content: Thought content normal.        Judgment: Judgment normal.      MD Evaluation Airway: WNL Heart: WNL Abdomen: WNL Chest/ Lungs: WNL ASA  Classification: 3 Mallampati/Airway Score: One   Imaging: Dg Chest 2 View  Result Date: 12/09/2018 CLINICAL DATA:  Elevated ammonia levels, weakness, cough EXAM: CHEST - 2 VIEW COMPARISON:  11/19/2018 FINDINGS: The heart size and mediastinal contours are within normal limits. Both lungs are clear. The visualized skeletal structures are unremarkable. IMPRESSION: No active cardiopulmonary disease. Electronically Signed   By: Kathreen Devoid   On: 12/09/2018 15:15   Dg Chest 2 View  Result Date: 11/19/2018 CLINICAL DATA:  Overall  weakness since yesterday. EXAM: CHEST - 2 VIEW COMPARISON:  11/15/2018 FINDINGS: The heart size and mediastinal contours are within normal limits. Nonaneurysmal atherosclerotic aorta. Both lungs are clear. The visualized skeletal structures are unremarkable. IMPRESSION: No active cardiopulmonary disease.  Aortic atherosclerosis. Electronically Signed   By: Ashley Royalty M.D.   On: 11/19/2018 16:17   Dg Chest 2 View  Result Date: 11/15/2018 CLINICAL DATA:  Weakness over the last month. Falling.  EXAM: CHEST - 2 VIEW COMPARISON:  11/04/2018 FINDINGS: Artifact overlies the chest. Heart size upper limits of normal. Chronic aortic atherosclerosis. Chronic linear scar the lung. No evidence of infiltrate, collapse or effusion. No sign of heart failure. No significant bone finding. IMPRESSION: No active disease. Aortic atherosclerosis. Electronically Signed   By: Nelson Chimes M.D.   On: 11/15/2018 09:55   Ct Head Wo Contrast  Result Date: 11/19/2018 CLINICAL DATA:  Increased confusion.  Fall 4 days ago. EXAM: CT HEAD WITHOUT CONTRAST TECHNIQUE: Contiguous axial images were obtained from the base of the skull through the vertex without intravenous contrast. COMPARISON:  11/15/2018 FINDINGS: Brain: Mild global atrophy is appropriate to age. Mild chronic ischemic changes in the periventricular white matter. There is no mass effect, midline shift, or acute hemorrhage. Vascular: No hyperdense vessel or unexpected calcification. Skull: The cranium is intact. Sinuses/Orbits: Orbits are within normal limits. Visualized paranasal sinuses and mastoid air cells are clear. Other: Noncontributory. IMPRESSION: No acute intracranial pathology. Electronically Signed   By: Marybelle Killings M.D.   On: 11/19/2018 19:53   Ct Head Wo Contrast  Result Date: 11/15/2018 CLINICAL DATA:  Weakness and two falls. Patient stated she hit the back of her head. Denies pain EXAM: CT HEAD WITHOUT CONTRAST TECHNIQUE: Contiguous axial images were  obtained from the base of the skull through the vertex without intravenous contrast. COMPARISON:  None. FINDINGS: Brain: No evidence of acute infarction, hemorrhage, hydrocephalus, extra-axial collection or mass lesion/mass effect. Mild patchy areas of white matter hypoattenuation consistent with chronic microvascular ischemic change. Vascular: No hyperdense vessel or unexpected calcification. Skull: Normal. Negative for fracture or focal lesion. Sinuses/Orbits: Globes and orbits are unremarkable. The visualized sinuses and mastoid air cells are clear. Other: None. IMPRESSION: 1. No acute intracranial abnormalities. 2. Mild chronic microvascular ischemic change. Electronically Signed   By: Lajean Manes M.D.   On: 11/15/2018 09:47   US Renal  Result Date: 12/10/2018 CLINICAL DATA:  Acute kidney injury EXAM: RENAL / URINARY TRACT ULTRASOUND COMPLETE COMPARISON:  CT 02/08/2016 FINDINGS: Right Kidney: Renal measurements: 8.5 x 5.0 x 4.0 cm = volume: 89 mL . Echogenicity within normal limits. No mass or hydronephrosis visualized. Left Kidney: Renal measurements: 10.2 x 5.1 x 4.7 cm = volume: 125 mL. Echogenicity within normal limits. No mass or hydronephrosis visualized. Bladder: Appears normal for degree of bladder distention. IMPRESSION: No acute findings.  No hydronephrosis. Electronically Signed   By: Rolm Baptise M.D.   On: 12/10/2018 02:06    Labs:  CBC: Recent Labs    11/20/18 0047 11/21/18 0534 12/09/18 1150 12/10/18 0655  WBC 22.5* 22.3* 24.8* 20.5*  HGB 12.3 12.9 12.5 11.0*  HCT 34.4* 36.4 35.7* 30.9*  PLT 119* 126* 106* 95*    COAGS: Recent Labs    11/15/18 1000 11/19/18 1501 12/11/18 1509  INR 1.46 1.38 1.67    BMP: Recent Labs    11/21/18 0534 11/25/18 1411 12/09/18 1150 12/10/18 0449  NA 136 131* 130* 127*  K 4.3 4.4 5.0 4.4  CL 95* 93* 92* 90*  CO2 _0 GLUCOSE 124* 149* 113* 88  BUN 24* 28* 35* 38*  CALCIUM 8.8* 9.1 8.6* 8.6*  CREATININE 1.15* 1.24*  1.73* 1.53*  GFRNONAA 42* 38* 26* 30*  GFRAA 49* 45* 30* 35*    LIVER FUNCTION TESTS: Recent Labs    11/19/18 1501 11/20/18 0300 11/21/18 0534 12/09/18 1150  BILITOT 5.5* 4.8* 5.7* 7.0*  AST 191* 165* 176* 202*  ALT 68* 60* 65* 79*  ALKPHOS 269* 218* 240* 268*  PROT 7.0 6.1* 6.2* 6.0*  ALBUMIN 2.9* 2.4* 2.6* 2.3*    TUMOR MARKERS: No results for input(s): AFPTM, CEA, CA199, CHROMGRNA in the last 8760 hours.  Assessment and Plan: Acute decompensated liver failure Patient evaluated by GI who requests random liver biopsy.  Met with patient who is alert and oriented at time of visit.  She is able to demonstrate ability to consent for herself.  Elevated WBC likely related to CLL.  Afebrile.  INR 1.67.  Will repeat lab tomorrow morning and discuss with IR MD.   Risks and benefits of liver biopsy was discussed with the patient and/or patient's family including, but not limited to bleeding, infection, damage to adjacent structures or low yield requiring additional tests.  All of the questions were answered and there is agreement to proceed.  Consent signed and in chart.  Thank you for this interesting consult.  I greatly enjoyed meeting Karen Richard and look forward to participating in their care.  A copy of this report was sent to the requesting provider on this date.  Electronically Signed: Docia Barrier, PA 12/11/2018, 4:46 PM   I spent a total of 40 Minutes    in face to face in clinical consultation, greater than 50% of which was counseling/coordinating care for nonalcoholic liver cirrhosis.

## 2018-12-11 NOTE — Progress Notes (Signed)
PROGRESS NOTE    EDESSA JAKUBOWICZ  UEK:[id number] DOB: Dec 26, 1926 DOA: 12/09/2018 PCP: Caren Macadam, MD    Brief Narrative:  85 year old female who presented with altered mental status.  She does have significant past medical history for nonalcoholic liver cirrhosis, CLL, heart failure, dyslipidemia and neuromuscular disorder.  Patient reported to have confusion and increased falls over the last 4 weeks, associated with generalized weakness and lower extremity edema.  On her initial physical examination blood pressure 125/35, heart rate 102, respiratory rate 14, temperature 97.8 F, oxygen saturation 95%.  She was awake and alert, heart S1-S2 present and rhythmic, her lungs were clear to auscultation bilaterally, abdomen nontender, positive lower extremity edema 4+ bilaterally lower extremities.  Patient was admitted to the hospital working diagnosis of acute encephalopathy due to acute decompensation of chronic liver failure.   Assessment & Plan:   Principal Problem:   Volume overload Active Problems:   CLL (chronic lymphocytic leukemia) (HCC)   Cirrhosis of liver without ascites (HCC)   Acute encephalopathy   Poor tolerance for ambulation   Fall at home, initial encounter   1. Acute decompensation of chronic liver failure. Continue supportive medical therapy with diuresis with spironolactone. Patient continue to have confusion and disorientation, continue lactulose 15 g tid, titrate to 2 to 3 bowel movements per day. Ammonia on admission was 21. Ferritin 1,427 with transferrin saturation of 90, serum iron 206, patient will have liver biopsy per GI recommendations, personally discussed with Dr. Therisa Doyne. Old records personally reviewed, liver US 11/09/2018 with progressive cirrhosis with no portal vein thrombosis.   2. AKI on CKD stage 3 with hyponatremia. Renal function with serum cr at 1,53 with K at 4,4 and serum bicarbonate at 24, will continue diuresis with spironolactone. Follow  urine output. Patient continue hypervolemic.   3. History of CLL. Continue to follow on cell counts.   4. Deconditioning. Likely multifactorial, will continue physical therapy evaluation, patient may need snf at discharge.   DVT prophylaxis: enoxaparin   Code Status: DNR Family Communication: I spoke with patient's husband at the bedside and all questions were addressed.  Disposition Plan/ discharge barriers: pending clinical improvement, liver biopsy.   Body mass index is 27.46 kg/m. Malnutrition Type:      Malnutrition Characteristics:      Nutrition Interventions:     RN Pressure Injury Documentation:     Consultants:   GI   Procedures:     Antimicrobials:       Subjective: Patient reports improvement on her lower extremity edema, but continue to be significant, has persistent confusion and generalized weakness, no nausea or vomiting.   Objective: Vitals:   12/11/18 0010 12/11/18 0443 12/11/18 0753 12/11/18 1129  BP: (!) 113/42 (!) 128/38 (!) 115/59 (!) 128/46  Pulse: 97 96 95 88  Resp: 18 17 18 17   Temp: 98.9 F (37.2 C) 98 F (36.7 C) 97.7 F (36.5 C) (!) 97.5 F (36.4 C)  TempSrc: Oral Oral    SpO2: 93% 94% 94% 95%  Weight:      Height:        Intake/Output Summary (Last 24 hours) at 12/11/2018 1302 Last data filed at 12/11/2018 0243 Gross per 24 hour  Intake 480 ml  Output 275 ml  Net 205 ml   Filed Weights   12/10/18 0228  Weight: 70.3 kg    Examination:   General: deconditioned and ill looking appearing  Neurology: Awake and alert, non focal  E ENT: positive pallor, positive icterus,  oral mucosa moist Cardiovascular: No JVD. S1-S2 present, rhythmic, no gallops, rubs, or murmurs. ++ bilateral lower extremity edema. Pulmonary: positive breath sounds bilaterally, adequate air movement, no wheezing, rhonchi or rales. Gastrointestinal. Abdomen protuberant with no significant ascites, no organomegaly, non tender, no rebound or  guarding Skin. No rashes Musculoskeletal: no joint deformities     Data Reviewed: I have personally reviewed following labs and imaging studies  CBC: Recent Labs  Lab 12/09/18 1150 12/09/18 1325 12/10/18 0655  WBC 24.8*  --  20.5*  NEUTROABS  --  19.6*  --   HGB 12.5  --  11.0*  HCT 35.7*  --  30.9*  MCV 96.7  --  94.2  PLT 106*  --  95*   Basic Metabolic Panel: Recent Labs  Lab 12/09/18 1150 12/10/18 0449  NA 130* 127*  K 5.0 4.4  CL 92* 90*  CO2 26 24  GLUCOSE 113* 88  BUN 35* 38*  CREATININE 1.73* 1.53*  CALCIUM 8.6* 8.6*   GFR: Estimated Creatinine Clearance: 23.5 mL/min (A) (by C-G formula based on SCr of 1.53 mg/dL (H)). Liver Function Tests: Recent Labs  Lab 12/09/18 1150  AST 202*  ALT 79*  ALKPHOS 268*  BILITOT 7.0*  PROT 6.0*  ALBUMIN 2.3*   No results for input(s): LIPASE, AMYLASE in the last 168 hours. Recent Labs  Lab 12/09/18 1200  AMMONIA 21   Coagulation Profile: No results for input(s): INR, PROTIME in the last 168 hours. Cardiac Enzymes: No results for input(s): CKTOTAL, CKMB, CKMBINDEX, TROPONINI in the last 168 hours. BNP (last 3 results) No results for input(s): PROBNP in the last 8760 hours. HbA1C: No results for input(s): HGBA1C in the last 72 hours. CBG: No results for input(s): GLUCAP in the last 168 hours. Lipid Profile: No results for input(s): CHOL, HDL, LDLCALC, TRIG, CHOLHDL, LDLDIRECT in the last 72 hours. Thyroid Function Tests: No results for input(s): TSH, T4TOTAL, FREET4, T3FREE, THYROIDAB in the last 72 hours. Anemia Panel: Recent Labs    12/11/18 0958  FERRITIN 1,427*  TIBC 230*  IRON 206*      Radiology Studies: I have reviewed all of the imaging during this hospital visit personally     Scheduled Meds: . heparin  5,000 Units Subcutaneous Q8H  . lactulose  15 g Oral TID  . spironolactone  50 mg Oral Daily   Continuous Infusions:   LOS: 0 days        Yanelli Zapanta [name],  MD

## 2018-12-11 NOTE — Consult Note (Addendum)
[facility] Consult  Referring Provider: Marcell Anger, MD/[facility] Primary Care Physician:  Caren Macadam, MD Primary Gastroenterologist: [facility](Dr.Johnson)  Reason for Consultation:  Management of cirrhosis  HPI: Karen Richard is a 85 y.o. female resident of [facility]-term [facility], admitted on 12/09/2018 for evaluation of elevated ammonia, increase in falls and confusion at home over the past 1 month.  Patient has history of cirrhosis, thought to be related to [facility] and recently developed features of decompensation including hepatic encephalopathy( requiring lactulose 3 times a day), ascites on ultrasound( quantity insufficient for paracentesis on ultrasound from 11/09/2018) and peripheral edema/ volume overload.  Recent labs performed with the [facility] from 12/02/2018 showed elevated ferritin of 1694.5, iron saturation of 102%, TIBC 234 and serum iron of 239, suspicious for iron overload,however, HFE gene mutuation was not detected during labs performed in 2017.  ASMA, AMA was negative, ANA was positive, Ceruloplasmin was normal.HBcIgM and HCV Ab were negative.  She had a virtual colonoscopy in 2007 which showed colonic polyps, subsequently 3 tubular adenomas were removed in 2008. She has history of breast cancer, CLL and chronic lymphedema.   Past Medical History:  Diagnosis Date  . Anxiety   . Arthritis   . Cancer ([place])   . Cataract   . Hyperlipidemia   . Neuromuscular disorder ([place])   . Swelling in chest    rigth breast where mastectomy was performed     Past Surgical History:  Procedure Laterality Date  . ABDOMINAL HYSTERECTOMY    . APPENDECTOMY    . BREAST SURGERY    . CHOLECYSTECTOMY    . EYE SURGERY    . JOINT REPLACEMENT      Prior to Admission medications   Medication Sig Start Date End Date Taking? Authorizing Provider  Acetaminophen (TYLENOL ARTHRITIS EXT RELIEF PO) Take 1 tablet by mouth as needed (pain).    Yes  [provider]  ciprofloxacin (CIPRO) 500 MG tablet Take 500 mg by mouth 2 (two) times daily. 12/04/18  Yes [provider]  lactulose (CHRONULAC) 10 GM/15ML solution Take 30 mLs (20 g total) by mouth 3 (three) times daily. Patient taking differently: Take 15 g by mouth 3 (three) times daily.  11/27/18  Yes Clegg, Amy D, NP  LORazepam (ATIVAN) 0.5 MG tablet Take 1 tablet (0.5 mg total) by mouth daily as needed for anxiety. 11/21/18  Yes Nita Sells, MD  spironolactone (ALDACTONE) 25 MG tablet Take 1 tablet (25 mg total) by mouth daily. 11/13/18  Yes Patrecia Pour, MD  torsemide (DEMADEX) 20 MG tablet Take 1 tablet (20 mg total) by mouth daily. 11/25/18  Yes Clegg, Amy D, NP  Vitamin D, Ergocalciferol, (DRISDOL) 1.25 MG (50000 UT) CAPS capsule TAKE 1 CAPSULE ONCE A WEEK Patient taking differently: Take 50,000 Units by mouth every 7 (seven) days. Saturday 11/04/18  Yes Volanda Napoleon, MD    Current Facility-Administered Medications  Medication Dose Route Frequency Provider Last Rate Last Dose  . furosemide (LASIX) injection 20 mg  20 mg Intravenous BID Marcell Anger, MD   20 mg at 12/10/18 1743  . heparin injection 5,000 Units  5,000 Units Subcutaneous Q8H Shela Leff, MD   5,000 Units at 12/11/18 0636  . lactulose (CHRONULAC) 10 GM/15ML solution 15 g  15 g Oral TID Shela Leff, MD   15 g at 12/10/18 2134    Allergies as of 12/09/2018 - Review Complete 12/09/2018  Allergen Reaction Noted  . Codeine Nausea Only 02/01/2010  .  Darvocet [propoxyphene n-acetaminophen] Nausea Only 07/13/2004    Family History  Problem Relation Age of Onset  . Diabetes Mother   . Heart attack Mother   . Diabetes Father   . Heart attack Father     Social History   Socioeconomic History  . Marital status: Married    Spouse name: Not on file  . Number of children: Not on file  . Years of education: Not on file  . Highest education level: Not on file   Occupational History  . Not on file  Social Needs  . Financial resource strain: Not on file  . Food insecurity:    Worry: Not on file    Inability: Not on file  . Transportation needs:    Medical: Not on file    Non-medical: Not on file  Tobacco Use  . Smoking status: Never Smoker  . Smokeless tobacco: Never Used  . Tobacco comment: never used tobacco  Substance and Sexual Activity  . Alcohol use: No    Alcohol/week: 0.0 standard drinks  . Drug use: No  . Sexual activity: Not Currently  Lifestyle  . Physical activity:    Days per week: Not on file    Minutes per session: Not on file  . Stress: Not on file  Relationships  . Social connections:    Talks on phone: Not on file    Gets together: Not on file    Attends religious service: Not on file    Active member of club or organization: Not on file    Attends meetings of clubs or organizations: Not on file    Relationship status: Not on file  . Intimate partner violence:    Fear of current or ex partner: Not on file    Emotionally abused: Not on file    Physically abused: Not on file    Forced sexual activity: Not on file  Other Topics Concern  . Not on file  Social History Narrative  . Not on file    Review of Systems: Positive for: GI: Described in detail in HPI.    Gen: fatigue, weakness, malaise, involuntary weight loss,Denies any fever, chills, rigors, night sweats, anorexia  and sleep disorder CV:  peripheral edema, Denies chest pain, angina, palpitations, syncope, orthopnea, PND and claudication. Resp: Denies dyspnea, cough, sputum, wheezing, coughing up blood. GU : Denies urinary burning, blood in urine, urinary frequency, urinary hesitancy, nocturnal urination, and urinary incontinence. MS: Denies joint pain or swelling.  Denies muscle weakness, cramps, atrophy.  Derm: Denies rash, itching, oral ulcerations, hives, unhealing ulcers.  Psych: confusion, Denies depression, anxiety, memory loss, suicidal  ideation, hallucinations. Heme: Denies bruising, bleeding, and enlarged lymph nodes. Neuro:  Denies any headaches, dizziness, paresthesias. Endo:  Denies any problems with DM, thyroid, adrenal function.  Physical Exam: Vital signs in last 24 hours: Temp:  [97.7 F (36.5 C)-98.9 F (37.2 C)] 97.7 F (36.5 C) (02/12 0753) Pulse Rate:  [83-98] 95 (02/12 0753) Resp:  [17-18] 18 (02/12 0753) BP: (103-128)/(34-59) 115/59 (02/12 0753) SpO2:  [93 %-97 %] 94 % (02/12 0753) Last BM Date: 12/10/18  General:   Alert,  Well-developed, well-nourished, pleasant and cooperative in NAD Head:  Normocephalic and atraumatic. Eyes:  Icterus,   Conjunctiva pink. Ears:  Normal auditory acuity. Nose:  No deformity, discharge,  or lesions. Mouth:  No deformity or lesions.  Oropharynx pink & moist. Neck:  Supple; no masses or thyromegaly. Lungs:  Clear throughout to auscultation.   No wheezes,  crackles, or rhonchi. No acute distress. Heart:  Regular rate and rhythm; no murmurs, clicks, rubs,  or gallops. Extremities:  Bilateral knee scars-prior bilateral knee replacement, changes of chronic lymphedema along with mild pitting pedal edema. Neurologic:  Alert and  oriented x4;  grossly normal neurologically.No asterixis. Skin:  Intact without significant lesions or rashes. Psych:  Alert and cooperative. Normal mood and affect. Abdomen:  Soft, nontender and nondistended. No masses, hepatosplenomegaly or hernias noted. Normal bowel sounds, without guarding, and without rebound.         Lab Results: Recent Labs    12/09/18 1150 12/10/18 0655  WBC 24.8* 20.5*  HGB 12.5 11.0*  HCT 35.7* 30.9*  PLT 106* 95*   BMET Recent Labs    12/09/18 1150 12/10/18 0449  NA 130* 127*  K 5.0 4.4  CL 92* 90*  CO2 26 24  GLUCOSE 113* 88  BUN 35* 38*  CREATININE 1.73* 1.53*  CALCIUM 8.6* 8.6*   LFT Recent Labs    12/09/18 1150  PROT 6.0*  ALBUMIN 2.3*  AST 202*  ALT 79*  ALKPHOS 268*  BILITOT 7.0*    PT/INR No results for input(s): LABPROT, INR in the last 72 hours.  Studies/Results: Dg Chest 2 View  Result Date: 12/09/2018 CLINICAL DATA:  Elevated ammonia levels, weakness, cough EXAM: CHEST - 2 VIEW COMPARISON:  11/19/2018 FINDINGS: The heart size and mediastinal contours are within normal limits. Both lungs are clear. The visualized skeletal structures are unremarkable. IMPRESSION: No active cardiopulmonary disease. Electronically Signed   By: Kathreen Devoid   On: 12/09/2018 15:15   US Renal  Result Date: 12/10/2018 CLINICAL DATA:  Acute kidney injury EXAM: RENAL / URINARY TRACT ULTRASOUND COMPLETE COMPARISON:  CT 02/08/2016 FINDINGS: Right Kidney: Renal measurements: 8.5 x 5.0 x 4.0 cm = volume: 89 mL . Echogenicity within normal limits. No mass or hydronephrosis visualized. Left Kidney: Renal measurements: 10.2 x 5.1 x 4.7 cm = volume: 125 mL. Echogenicity within normal limits. No mass or hydronephrosis visualized. Bladder: Appears normal for degree of bladder distention. IMPRESSION: No acute findings.  No hydronephrosis. Electronically Signed   By: Rolm Baptise M.D.   On: 12/10/2018 02:06    Impression: DECOMPENSATED CIRRHOSIS-could be related to NASH versus iron overload MELDna score is 27  Renal impairment, reduced GFR since starting diuretics in 10/2018  Hyponatremia Abnormal liver enzymes( AST higher than ALT, no history of alcohol use or abuse in the past), elevated ALP Persistent leukocytosis Thrombocytopenia  Plan: Discontinue Lasix and start spironolactone 50 mg a day, monitor renal function, if renal function worsens , may need IV albumin. Monitor daily weights. Continue lactulose 15 g 3 times a day Repeat iron panel and ferritin, if remains elevated consider liver biopsy( ANA+ve), although the course of disease progression may not change with liver biopsy, but if this is related to iron overload, patient may benefit from phlebotomy. Poor prgonosis, not a liver  transplant candidate.    LOS: 0 days   Ronnette Juniper, MD  12/11/2018, 8:36 AM  Pager [phone] If no answer or after 5 PM call [phone]

## 2018-12-12 ENCOUNTER — Encounter (HOSPITAL_COMMUNITY): Payer: Self-pay | Admitting: Diagnostic Radiology

## 2018-12-12 ENCOUNTER — Inpatient Hospital Stay (HOSPITAL_COMMUNITY): Payer: Medicare Other

## 2018-12-12 DIAGNOSIS — K7210 Chronic hepatic failure without coma: Secondary | ICD-10-CM

## 2018-12-12 HISTORY — PX: IR TRANSCATHETER BX: IMG713

## 2018-12-12 HISTORY — PX: IR US GUIDE VASC ACCESS RIGHT: IMG2390

## 2018-12-12 HISTORY — PX: IR VENOGRAM HEPATIC W HEMODYNAMIC EVALUATION: IMG692

## 2018-12-12 LAB — BASIC METABOLIC PANEL
Anion gap: 8 (ref 5–15)
BUN: 38 mg/dL — ABNORMAL HIGH (ref 8–23)
CALCIUM: 8.5 mg/dL — AB (ref 8.9–10.3)
CO2: 30 mmol/L (ref 22–32)
Chloride: 91 mmol/L — ABNORMAL LOW (ref 98–111)
Creatinine, Ser: 1.36 mg/dL — ABNORMAL HIGH (ref 0.44–1.00)
GFR calc Af Amer: 40 mL/min — ABNORMAL LOW (ref >60)
GFR calc non Af Amer: 34 mL/min — ABNORMAL LOW (ref >60)
Glucose, Bld: 105 mg/dL — ABNORMAL HIGH (ref 70–99)
Potassium: 4.4 mmol/L (ref 3.5–5.1)
Sodium: 129 mmol/L — ABNORMAL LOW (ref 135–145)

## 2018-12-12 LAB — CBC WITH DIFFERENTIAL/PLATELET
Abs Immature Granulocytes: 0.2 10*3/uL — ABNORMAL HIGH (ref 0.00–0.07)
Basophils Absolute: 0.7 10*3/uL — ABNORMAL HIGH (ref 0.0–0.1)
Basophils Relative: 3 %
EOS ABS: 0.2 10*3/uL (ref 0.0–0.5)
Eosinophils Relative: 1 %
HCT: 31.7 % — ABNORMAL LOW (ref 36.0–46.0)
Hemoglobin: 11.3 g/dL — ABNORMAL LOW (ref 12.0–15.0)
LYMPHS ABS: 1.6 10*3/uL (ref 0.7–4.0)
Lymphocytes Relative: 7 %
MCH: 34 pg (ref 26.0–34.0)
MCHC: 35.6 g/dL (ref 30.0–36.0)
MCV: 95.5 fL (ref 80.0–100.0)
Monocytes Absolute: 0.9 10*3/uL (ref 0.1–1.0)
Monocytes Relative: 4 %
Myelocytes: 1 %
Neutro Abs: 19 10*3/uL — ABNORMAL HIGH (ref 1.7–7.7)
Neutrophils Relative %: 84 %
Platelets: 92 10*3/uL — ABNORMAL LOW (ref 150–400)
RBC: 3.32 MIL/uL — ABNORMAL LOW (ref 3.87–5.11)
RDW: 17.4 % — ABNORMAL HIGH (ref 11.5–15.5)
WBC: 22.6 10*3/uL — ABNORMAL HIGH (ref 4.0–10.5)
nRBC: 0 % (ref 0.0–0.2)
nRBC: 0 /100 WBC

## 2018-12-12 LAB — PROTIME-INR
INR: 1.67
Prothrombin Time: 19.5 seconds — ABNORMAL HIGH (ref 11.4–15.2)

## 2018-12-12 MED ORDER — LIDOCAINE HCL 1 % IJ SOLN
INTRAMUSCULAR | Status: AC
  Filled 2018-12-12: qty 20

## 2018-12-12 MED ORDER — FENTANYL CITRATE (PF) 100 MCG/2ML IJ SOLN
INTRAMUSCULAR | Status: AC
  Filled 2018-12-12: qty 2

## 2018-12-12 MED ORDER — MIDAZOLAM HCL 2 MG/2ML IJ SOLN
INTRAMUSCULAR | Status: AC
  Filled 2018-12-12: qty 2

## 2018-12-12 MED ORDER — IOPAMIDOL (ISOVUE-300) INJECTION 61%
INTRAVENOUS | Status: AC
  Administered 2018-12-12: 10 mL
  Filled 2018-12-12: qty 50

## 2018-12-12 MED ORDER — FUROSEMIDE 10 MG/ML IJ SOLN
40.0000 mg | Freq: Once | INTRAMUSCULAR | Status: AC
  Administered 2018-12-12: 40 mg via INTRAVENOUS
  Filled 2018-12-12: qty 4

## 2018-12-12 MED ORDER — MIDAZOLAM HCL 2 MG/2ML IJ SOLN
INTRAMUSCULAR | Status: AC | PRN
  Administered 2018-12-12: 0.5 mg via INTRAVENOUS

## 2018-12-12 MED ORDER — LIDOCAINE HCL 1 % IJ SOLN
INTRAMUSCULAR | Status: AC | PRN
  Administered 2018-12-12: 5 mL

## 2018-12-12 NOTE — Progress Notes (Signed)
Referring Physician(s): Dr. Therisa Doyne  Supervising Physician: Markus Daft  Patient Status:  [facility] - In-pt  Chief Complaint: Decompensated liver failure  Subjective: Resting comfortably in bed.  Hungry.  Ready for biopsy today.  Allergies: Codeine and Darvocet [propoxyphene n-acetaminophen]  Medications: Prior to Admission medications   Medication Sig Start Date End Date Taking? Authorizing Provider  Acetaminophen (TYLENOL ARTHRITIS EXT RELIEF PO) Take 1 tablet by mouth as needed (pain).    Yes [provider]  ciprofloxacin (CIPRO) 500 MG tablet Take 500 mg by mouth 2 (two) times daily. 12/04/18  Yes [provider]  lactulose (CHRONULAC) 10 GM/15ML solution Take 30 mLs (20 g total) by mouth 3 (three) times daily. Patient taking differently: Take 15 g by mouth 3 (three) times daily.  11/27/18  Yes Clegg, Amy D, NP  LORazepam (ATIVAN) 0.5 MG tablet Take 1 tablet (0.5 mg total) by mouth daily as needed for anxiety. 11/21/18  Yes Nita Sells, MD  spironolactone (ALDACTONE) 25 MG tablet Take 1 tablet (25 mg total) by mouth daily. 11/13/18  Yes Patrecia Pour, MD  torsemide (DEMADEX) 20 MG tablet Take 1 tablet (20 mg total) by mouth daily. 11/25/18  Yes Clegg, Amy D, NP  Vitamin D, Ergocalciferol, (DRISDOL) 1.25 MG (50000 UT) CAPS capsule TAKE 1 CAPSULE ONCE A WEEK Patient taking differently: Take 50,000 Units by mouth every 7 (seven) days. Saturday 11/04/18  Yes Volanda Napoleon, MD     Vital Signs: BP (!) 122/46 (BP Location: Right Arm)   Pulse 88   Temp 97.9 F (36.6 C) (Oral)   Resp 20   Ht 5' 3"  (1.6 m)   Wt 155 lb (70.3 kg)   SpO2 95%   BMI 27.46 kg/m   Physical Exam  NAD, comfortable in bed Neck: supple, full ROM Abdomen: NT/ND   Imaging: Dg Chest 2 View  Result Date: 12/09/2018 CLINICAL DATA:  Elevated ammonia levels, weakness, cough EXAM: CHEST - 2 VIEW COMPARISON:  11/19/2018 FINDINGS: The heart size and mediastinal contours are within normal  limits. Both lungs are clear. The visualized skeletal structures are unremarkable. IMPRESSION: No active cardiopulmonary disease. Electronically Signed   By: Kathreen Devoid   On: 12/09/2018 15:15   US Renal  Result Date: 12/10/2018 CLINICAL DATA:  Acute kidney injury EXAM: RENAL / URINARY TRACT ULTRASOUND COMPLETE COMPARISON:  CT 02/08/2016 FINDINGS: Right Kidney: Renal measurements: 8.5 x 5.0 x 4.0 cm = volume: 89 mL . Echogenicity within normal limits. No mass or hydronephrosis visualized. Left Kidney: Renal measurements: 10.2 x 5.1 x 4.7 cm = volume: 125 mL. Echogenicity within normal limits. No mass or hydronephrosis visualized. Bladder: Appears normal for degree of bladder distention. IMPRESSION: No acute findings.  No hydronephrosis. Electronically Signed   By: Rolm Baptise M.D.   On: 12/10/2018 02:06    Labs:  CBC: Recent Labs    11/21/18 0534 12/09/18 1150 12/10/18 0655 12/12/18 0532  WBC 22.3* 24.8* 20.5* 22.6*  HGB 12.9 12.5 11.0* 11.3*  HCT 36.4 35.7* 30.9* 31.7*  PLT 126* 106* 95* 92*    COAGS: Recent Labs    11/15/18 1000 11/19/18 1501 12/11/18 1509 12/12/18 0532  INR 1.46 1.38 1.67 1.67    BMP: Recent Labs    11/25/18 1411 12/09/18 1150 12/10/18 0449 12/12/18 0532  NA 131* 130* 127* 129*  K 4.4 5.0 4.4 4.4  CL 93* 92* 90* 91*  CO2 25 26 24 30   GLUCOSE 149* 113* 88 105*  BUN 28*  35* 38* 38*  CALCIUM 9.1 8.6* 8.6* 8.5*  CREATININE 1.24* 1.73* 1.53* 1.36*  GFRNONAA 38* 26* 30* 34*  GFRAA 45* 30* 35* 40*    LIVER FUNCTION TESTS: Recent Labs    11/19/18 1501 11/20/18 0300 11/21/18 0534 12/09/18 1150  BILITOT 5.5* 4.8* 5.7* 7.0*  AST 191* 165* 176* 202*  ALT 68* 60* 65* 79*  ALKPHOS 269* 218* 240* 268*  PROT 7.0 6.1* 6.2* 6.0*  ALBUMIN 2.9* 2.4* 2.6* 2.3*    Assessment and Plan: Acute decompensated liver failure Patient planning for liver biopsy today in IR.  Repeat INR was stable at 1.67 this AM.    Discussed case and labwork with Dr.  Anselm Pancoast.  OK to proceed but will change to transjugular approach given ascites on imaging.   PA to bedside to discuss with patient.   Risks and benefits of liver biopsy was discussed with the patient and/or patient's family including, but not limited to bleeding, infection, damage to adjacent structures or low yield requiring additional tests.  All of the questions were answered and there is agreement to proceed via transjugular approach.  Consent updated, signed and in chart.  Electronically Signed: Docia Barrier, PA 12/12/2018, 11:17 AM   I spent a total of 15 Minutes at the the patient's bedside AND on the patient's hospital floor or unit, greater than 50% of which was counseling/coordinating care for nonalcoholic liver cirrhosis.

## 2018-12-12 NOTE — Procedures (Signed)
Interventional Radiology Procedure:   Indications: Decompensated cirrhosis  Procedure: Transjugular liver biopsy with pressures  Findings: 4 cores obtained.  Hepatic vein pressure gradient greater than 20.  Complications: None     EBL: Minimal  Plan: Bedrest today.     Xian Alves R. Anselm Pancoast, MD  Pager: [phone]

## 2018-12-12 NOTE — Progress Notes (Addendum)
Subjective: The patient was seen and examined at bedside. She complains of weakness as she has not eaten anything post midnight and is awaiting a liver biopsy. We do not have daily weight performed today as patient feels too weak to stand up. She has been having multiple loose bowel movements with the use of lactulose and denies confusion.  Objective: Vital signs in last 24 hours: Temp:  [97.5 F (36.4 C)-98.1 F (36.7 C)] 97.9 F (36.6 C) (02/13 0820) Pulse Rate:  [88-102] 88 (02/13 0820) Resp:  [17-20] 20 (02/13 0820) BP: (102-128)/(40-79) 122/46 (02/13 0820) SpO2:  [93 %-98 %] 95 % (02/13 0820) Weight change:  Last BM Date: 12/11/18  TI:[id number], frail, appears her stated age GENERAL:icteric, mild pallor ABDOMEN:soft, nondistended, nontender, normoactive bowel sounds, no shifting dullness of fluid thrill EXTREMITIES:changes of chronic lymphedema, no obvious pitting edema noted  Lab Results: Results for orders placed or performed during the hospital encounter of 12/09/18 (from the past 48 hour(s))  Iron and TIBC     Status: Abnormal   Collection Time: 12/11/18  9:58 AM  Result Value Ref Range   Iron 206 (H) 28 - 170 ug/dL   TIBC 230 (L) 250 - 450 ug/dL   Saturation Ratios 90 (H) 10.4 - 31.8 %   UIBC 24 ug/dL    Comment: Performed at [facility], [address] [place], [place] [address]  Ferritin     Status: Abnormal   Collection Time: 12/11/18  9:58 AM  Result Value Ref Range   Ferritin 1,427 (H) 11 - 307 ng/mL    Comment: Performed at [facility], [place] [address], [place], [place] [address]  Protime-INR     Status: Abnormal   Collection Time: 12/11/18  3:09 PM  Result Value Ref Range   Prothrombin Time 19.5 (H) 11.4 - 15.2 seconds   INR 1.67     Comment: Performed at [facility] [address] [place], [place] [address]  Basic metabolic panel     Status: Abnormal   Collection Time: 12/12/18  5:32 AM  Result Value Ref Range   Sodium 129 (L) 135  - 145 mmol/L   Potassium 4.4 3.5 - 5.1 mmol/L    Comment: SLIGHT HEMOLYSIS   Chloride 91 (L) 98 - 111 mmol/L   CO2 30 22 - 32 mmol/L   Glucose, Bld 105 (H) 70 - 99 mg/dL   BUN 38 (H) 8 - 23 mg/dL   Creatinine, Ser 1.36 (H) 0.44 - 1.00 mg/dL   Calcium 8.5 (L) 8.9 - 10.3 mg/dL   GFR calc non Af Amer 34 (L) >60 mL/min   GFR calc Af Amer 40 (L) >60 mL/min   Anion gap 8 5 - 15    Comment: Performed at [facility] [address], [place], [place] [address]  CBC with Differential/Platelet     Status: Abnormal   Collection Time: 12/12/18  5:32 AM  Result Value Ref Range   WBC 22.6 (H) 4.0 - 10.5 K/uL   RBC 3.32 (L) 3.87 - 5.11 MIL/uL   Hemoglobin 11.3 (L) 12.0 - 15.0 g/dL   HCT 31.7 (L) 36.0 - 46.0 %   MCV 95.5 80.0 - 100.0 fL   MCH 34.0 26.0 - 34.0 pg   MCHC 35.6 30.0 - 36.0 g/dL   RDW 17.4 (H) 11.5 - 15.5 %   Platelets 92 (L) 150 - 400 K/uL    Comment: REPEATED TO VERIFY Immature Platelet Fraction may  be clinically indicated, consider ordering this additional test [id number] CONSISTENT WITH PREVIOUS RESULT    nRBC 0.0 0.0 - 0.2 %   Neutrophils Relative % 84 %   Neutro Abs 19.0 (H) 1.7 - 7.7 K/uL   Lymphocytes Relative 7 %   Lymphs Abs 1.6 0.7 - 4.0 K/uL   Monocytes Relative 4 %   Monocytes Absolute 0.9 0.1 - 1.0 K/uL   Eosinophils Relative 1 %   Eosinophils Absolute 0.2 0.0 - 0.5 K/uL   Basophils Relative 3 %   Basophils Absolute 0.7 (H) 0.0 - 0.1 K/uL   nRBC 0 0 /100 WBC   Myelocytes 1 %   Abs Immature Granulocytes 0.20 (H) 0.00 - 0.07 K/uL   Burr Cells PRESENT    Polychromasia PRESENT     Comment: Performed at [facility], [place] [address], [place], [place] [address]  Protime-INR     Status: Abnormal   Collection Time: 12/12/18  5:32 AM  Result Value Ref Range   Prothrombin Time 19.5 (H) 11.4 - 15.2 seconds   INR 1.67     Comment: Performed at [facility] [address] [place],  [address]    Studies/Results: No results  found.  Medications: I have reviewed the patient's current medications.  Assessment: Decompensated cirrhosis could be related to [facility] versus iron overload MELDNa of 27 History of confusion and hepatic encephalopathy Renal impairment, GFR was 34( 30 yesterday) urine output for 480 mL in 24 hours from 12/10/2020 to 12/11/2018 Hyponatremia Leukocytosis, likely from underlying CLL  Plan: Liver biopsy planned today Can be resumed on regular diet thereafter, no need for sodium restriction(she has hyponatremia) HFE gene mutation results pending Continue lactulose 15 g 3 times a day and spironolactone 50 mg daily from now Possible discharge tomorrow morning Poor prognosis, not a liver transplant candidate   Ronnette Juniper 12/12/2018, 8:36 AM   Pager [phone] If no answer or after 5 PM call [phone]

## 2018-12-12 NOTE — Clinical Social Work Note (Signed)
Clinical Social Work Assessment  Patient Details  Name: Karen Richard MRN: [id number] Date of Birth: 03-04-1927  Date of referral:  12/12/18               Reason for consult:  Facility Placement                Permission sought to share information with:  Facility Sport and exercise psychologist, Family Supports Permission granted to share information::  Yes, Verbal Permission Granted  Name::     Veterinary surgeon::  [facility]  Relationship::  Spouse  Contact Information:     Housing/Transportation Living arrangements for the past 2 months:  [place] of Information:  Patient, Scientist, water quality, Spouse Patient Interpreter Needed:  None Criminal Activity/Legal Involvement Pertinent to Current Situation/Hospitalization:  No - Comment as needed Significant Relationships:  Spouse Lives with:  Self, Spouse Do you feel safe going back to the place where you live?  Yes Need for family participation in patient care:  Yes (Comment)  Care giving concerns:  Patient from [facility] living, but is being recommended for SNF at discharge. Patient and husband would like answers on what is wrong with the patient before they are ready for her to discharge.   Social Worker assessment / plan:  CSW met with patient and husband at bedside to discuss discharge plan. Patient and husband upset, thinking that the patient was being discharged because CSW was in to talk about the plan with them. CSW explained that planning for discharge really begins at admission and it's better to know what the plan is before they're ready to leave the hospital so it's not an emergency at the last minute, and patient and husband were understanding. Patient and husband still indicated that they were hopeful that there would be an answer on what's wrong with the patient before she's discharged back to [facility], and CSW empathized with their concerns of not knowing what's wrong with the patient. CSW  discussed with them going to rehab at [facility], and they are in agreement.  Employment status:  Retired Nurse, adult PT Recommendations:  [place] / Referral to community resources:  [place]  Patient/Family's Response to care:  Patient and husband agreeable to SNF at [facility].  Patient/Family's Understanding of and Emotional Response to Diagnosis, Current Treatment, and Prognosis:  Patient and husband initially upset about discussion, thinking that the patient was being told that she was being discharged, but then calmed down when reassured that patient was not quite ready yet but just wanted to begin the discussion. Patient and husband appreciative that the discussion was begun early so that they knew that the plan was, but adamant that they wanted answers before they'd be agreeable to leaving the hospital.   Emotional Assessment Appearance:  Appears stated age Attitude/Demeanor/Rapport:  Engaged Affect (typically observed):  Pleasant Orientation:  Oriented to Self, Oriented to Place, Oriented to  Time, Oriented to Situation Alcohol / Substance use:  Not Applicable Psych involvement (Current and /or in the community):  No (Comment)  Discharge Needs  Concerns to be addressed:  Care Coordination Readmission within the last 30 days:  No Current discharge risk:  Physical Impairment, Dependent with Mobility Barriers to Discharge:  Continued Medical Work up, [place], [place] 12/12/2018, 12:18 PM

## 2018-12-12 NOTE — NC FL2 (Signed)
[place] LEVEL OF CARE SCREENING TOOL     IDENTIFICATION  Patient Name: Karen Richard Birthdate: [birth year] Sex: female Admission Date (Current Location): 12/09/2018  [facility] and [place] Number:  Herbalist and Address:  [facility], [place] [address] [place], [place] [address]      Provider Number: [id number]  Attending Physician Name and Address:  Tawni Millers  Relative Name and Phone Number:       Current Level of Care: Hospital Recommended Level of Care: [place] Prior Approval Number:    Date Approved/Denied:   PASRR Number: [id number]  Discharge Plan: SNF    Current Diagnoses: Patient Active Problem List   Diagnosis Date Noted  . Liver failure ([place]) 12/11/2018  . Poor tolerance for ambulation 12/10/2018  . Fall at home, initial encounter 12/10/2018  . Volume overload 12/09/2018  . Acute encephalopathy 11/19/2018  . Ascites   . CHF exacerbation ([place]) 11/07/2018  . Acute exacerbation of CHF (congestive heart failure) ([place]) 11/04/2018  . Edema of upper extremity 11/04/2018  . Intertrigo 11/04/2018  . Leukocytosis 11/04/2018  . Hypoalbuminemia 11/04/2018  . Elevated LFTs 11/04/2018  . Anxiety 11/04/2018  . Chronic back pain 11/04/2018  . Anasarca 09/05/2017  . Cirrhosis of liver without ascites ([place]) 02/09/2017  . LBBB (left bundle branch block) 03/31/2016  . Lymphedema 03/31/2016  . Peripheral edema 02/06/2013  . Back pain, thoracic 05/14/2012  . CLL (chronic lymphocytic leukemia) ([place]) 02/19/2012  . Urinary frequency 01/11/2011  . RUQ abdominal pain 01/11/2011  . ARTHRITIS, CERVICAL SPINE 06/22/2010  . DISTURBANCE OF SKIN SENSATION 06/22/2010  . HYPOGLYCEMIA, UNSPECIFIED 02/08/2010  . IRREGULAR HEART RATE 02/01/2010  . ABNORMAL INVOLUNTARY MOVEMENTS 02/01/2010  . CHEST DISCOMFORT 02/01/2010  . INSOMNIA 07/22/2009  . OTHER MALAISE AND FATIGUE 07/22/2009  . HEAT  INTOLERANCE 07/22/2009  . VITAMIN D DEFICIENCY 02/11/2009  . Malignant neoplasm of female breast ([place]) 12/04/2008  . ANXIETY, SITUATIONAL 07/22/2008  . NOCTURIA 07/22/2008  . Elevated blood pressure reading without diagnosis of hypertension 07/22/2008  . SYMPTOM, COUGH 03/11/2007  . ONYCHOMYCOSIS 12/27/2006  . HYPERLIPIDEMIA 12/27/2006  . RESTLESS LEGS SYNDROME 12/27/2006  . SEBORRHEIC KERATOSIS, NOS 12/27/2006  . DJD, UNSPECIFIED 12/27/2006  . OSTEOARTHRITIS, MULTI SITES 12/27/2006    Orientation RESPIRATION BLADDER Height & Weight     Self, Time, Situation, Place  Normal Incontinent Weight: 155 lb (70.3 kg) Height:  5' 3"  (160 cm)  BEHAVIORAL SYMPTOMS/MOOD NEUROLOGICAL BOWEL NUTRITION STATUS      Incontinent Diet(see DC summary)  AMBULATORY STATUS COMMUNICATION OF NEEDS Skin   Limited Assist Verbally Normal                       Personal Care Assistance Level of Assistance  Bathing, Feeding, Dressing Bathing Assistance: Limited assistance Feeding assistance: Independent Dressing Assistance: Limited assistance     Functional Limitations Info  Sight, Hearing, Speech Sight Info: Impaired Hearing Info: Impaired Speech Info: Adequate    SPECIAL CARE FACTORS FREQUENCY  PT (By licensed PT), OT (By licensed OT)     PT Frequency: 5x/wk OT Frequency: 5x/wk            Contractures Contractures Info: Not present    Additional Factors Info  Code Status, Allergies Code Status Info: DNR Allergies Info: Codeine, Darvocet Propoxyphene N-acetaminophen           Current Medications (12/12/2018):  This is the current hospital active medication list Current  Facility-Administered Medications  Medication Dose Route Frequency Provider Last Rate Last Dose  . heparin injection 5,000 Units  5,000 Units Subcutaneous Q8H Matthews, Kacie Sue-Ellen, PA      . lactulose (CHRONULAC) 10 GM/15ML solution 15 g  15 g Oral TID Shela Leff, MD   15 g at 12/11/18 2217  .  spironolactone (ALDACTONE) tablet 50 mg  50 mg Oral Daily Ronnette Juniper, MD   50 mg at 12/11/18 1022     Discharge Medications: Please see discharge summary for a list of discharge medications.  Relevant Imaging Results:  Relevant Lab Results:   Additional Information SS#: [id number]  Geralynn Ochs, LCSW

## 2018-12-12 NOTE — Progress Notes (Signed)
PROGRESS NOTE    Karen Richard  BSW:[id number] DOB: 1926-12-15 DOA: 12/09/2018 PCP: Caren Macadam, MD    Brief Narrative:  85 year old female who presented with altered mental status.  She does have significant past medical history for nonalcoholic liver cirrhosis, CLL, heart failure, dyslipidemia and neuromuscular disorder.  Patient reported to have confusion and increased falls over the last 4 weeks, associated with generalized weakness and lower extremity edema.  On her initial physical examination blood pressure 125/35, heart rate 102, respiratory rate 14, temperature 97.8 F, oxygen saturation 95%.  She was awake and alert, heart S1-S2 present and rhythmic, her lungs were clear to auscultation bilaterally, abdomen nontender, positive lower extremity edema 4+ bilaterally lower extremities.  Patient was admitted to the hospital working diagnosis of acute encephalopathy due to acute decompensation of chronic liver failure.    Assessment & Plan:   Principal Problem:   Volume overload Active Problems:   CLL (chronic lymphocytic leukemia) (HCC)   Cirrhosis of liver without ascites (HCC)   Acute encephalopathy   Poor tolerance for ambulation   Fall at home, initial encounter   Liver failure ([place])  1. Acute decompensation of chronic liver failure. Patient continue to be significantly edematous, will add one dose of IV furosemide and continue with spironolactone. No evidence of significant ascites on exam, but patient has a protuberant abdomen. Will follow with liver biopsy due to elevated ferritin, possible hemochromatosis. Patient continue to be disorientated but not agitates, question about baseline. INR has been stable at 1,67. Follow with physical therapy recommendations, patient will need snf at discharge. Her calculated MELD score is 27 with 19,6% estimated mortality in 3 months. Will consult nutrition.   2. AKI on CKD stage 3 with hyponatremia. Today's serum cr is 1,36 with K  at 4,4 and serum bicarbonate at 30. Will continue with spironolactone , and one dose of IV furosemide today.   3. History of CLL. Seems to be stable with wbc at 22,6 with Hb at 11.3 and platelets at 92.    4. Deconditioning. Severe deconditioning, patient will need snf at discharge.   DVT prophylaxis: enoxaparin   Code Status: DNR Family Communication: I spoke with patient's husband at the bedside and all questions were addressed.  Disposition Plan/ discharge barriers: pending clinical improvement, liver biopsy.     Body mass index is 27.46 kg/m. Malnutrition Type:      Malnutrition Characteristics:      Nutrition Interventions:     RN Pressure Injury Documentation:     Consultants:   GI   IR  Procedures:   Liver biopsy.   Antimicrobials:       Subjective: Patient continue to feel very weak and deconditioned, episodic confusion, persistent lower extremity edema. No nausea or vomiting, no chest pain or dyspnea. Patient has been NPO since midnight.   Objective: Vitals:   12/11/18 2000 12/11/18 2356 12/12/18 0340 12/12/18 0820  BP: 102/79 (!) 124/40 (!) 111/44 (!) 122/46  Pulse: (!) 102 98 90 88  Resp: 17 17 18 20   Temp: 97.8 F (36.6 C) 98.1 F (36.7 C) 98 F (36.7 C) 97.9 F (36.6 C)  TempSrc: Oral Oral Oral Oral  SpO2: 97% 93% 95% 95%  Weight:      Height:        Intake/Output Summary (Last 24 hours) at 12/12/2018 1146 Last data filed at 12/11/2018 2030 Gross per 24 hour  Intake -  Output 200 ml  Net -200 ml   [employer]  12/10/18 0228  Weight: 70.3 kg    Examination:   General: deconditioned and ill looking appearing  Neurology: Awake and alert, non focal  E ENT: positive pallor, positive icterus, oral mucosa dry.  Cardiovascular: No JVD. S1-S2 present, rhythmic, no gallops, rubs, or murmurs. No lower extremity edema. Pulmonary: decreased breath sounds bilaterally at bases, adequate air movement, no wheezing, rhonchi or  rales. Gastrointestinal. Abdomen protuberant with no significant ascites, no organomegaly, non tender, no rebound or guarding Skin. No rashes Musculoskeletal: no joint deformities     Data Reviewed: I have personally reviewed following labs and imaging studies  CBC: Recent Labs  Lab 12/09/18 1150 12/09/18 1325 12/10/18 0655 12/12/18 0532  WBC 24.8*  --  20.5* 22.6*  NEUTROABS  --  19.6*  --  19.0*  HGB 12.5  --  11.0* 11.3*  HCT 35.7*  --  30.9* 31.7*  MCV 96.7  --  94.2 95.5  PLT 106*  --  95* 92*   Basic Metabolic Panel: Recent Labs  Lab 12/09/18 1150 12/10/18 0449 12/12/18 0532  NA 130* 127* 129*  K 5.0 4.4 4.4  CL 92* 90* 91*  CO2 26 24 30   GLUCOSE 113* 88 105*  BUN 35* 38* 38*  CREATININE 1.73* 1.53* 1.36*  CALCIUM 8.6* 8.6* 8.5*   GFR: Estimated Creatinine Clearance: 26.4 mL/min (A) (by C-G formula based on SCr of 1.36 mg/dL (H)). Liver Function Tests: Recent Labs  Lab 12/09/18 1150  AST 202*  ALT 79*  ALKPHOS 268*  BILITOT 7.0*  PROT 6.0*  ALBUMIN 2.3*   No results for input(s): LIPASE, AMYLASE in the last 168 hours. Recent Labs  Lab 12/09/18 1200  AMMONIA 21   Coagulation Profile: Recent Labs  Lab 12/11/18 1509 12/12/18 0532  INR 1.67 1.67   Cardiac Enzymes: No results for input(s): CKTOTAL, CKMB, CKMBINDEX, TROPONINI in the last 168 hours. BNP (last 3 results) No results for input(s): PROBNP in the last 8760 hours. HbA1C: No results for input(s): HGBA1C in the last 72 hours. CBG: No results for input(s): GLUCAP in the last 168 hours. Lipid Profile: No results for input(s): CHOL, HDL, LDLCALC, TRIG, CHOLHDL, LDLDIRECT in the last 72 hours. Thyroid Function Tests: No results for input(s): TSH, T4TOTAL, FREET4, T3FREE, THYROIDAB in the last 72 hours. Anemia Panel: Recent Labs    12/11/18 0958  FERRITIN 1,427*  TIBC 230*  IRON 206*      Radiology Studies: I have reviewed all of the imaging during this hospital visit  personally     Scheduled Meds: . heparin  5,000 Units Subcutaneous Q8H  . lactulose  15 g Oral TID  . spironolactone  50 mg Oral Daily   Continuous Infusions:   LOS: 1 day        Mauricio [name], MD

## 2018-12-13 LAB — BASIC METABOLIC PANEL
Anion gap: 13 (ref 5–15)
BUN: 33 mg/dL — ABNORMAL HIGH (ref 8–23)
CO2: 27 mmol/L (ref 22–32)
Calcium: 9.1 mg/dL (ref 8.9–10.3)
Chloride: 91 mmol/L — ABNORMAL LOW (ref 98–111)
Creatinine, Ser: 1.31 mg/dL — ABNORMAL HIGH (ref 0.44–1.00)
GFR calc Af Amer: 42 mL/min — ABNORMAL LOW (ref >60)
GFR calc non Af Amer: 36 mL/min — ABNORMAL LOW (ref >60)
GLUCOSE: 153 mg/dL — AB (ref 70–99)
Potassium: 4.4 mmol/L (ref 3.5–5.1)
Sodium: 131 mmol/L — ABNORMAL LOW (ref 135–145)

## 2018-12-13 LAB — PATHOLOGIST SMEAR REVIEW

## 2018-12-13 MED ORDER — ENSURE ENLIVE PO LIQD
237.0000 mL | Freq: Two times a day (BID) | ORAL | Status: DC
  Administered 2018-12-13: 237 mL via ORAL
  Filled 2018-12-13: qty 237

## 2018-12-13 MED ORDER — ONDANSETRON HCL 4 MG/2ML IJ SOLN
4.0000 mg | INTRAMUSCULAR | Status: DC | PRN
  Administered 2018-12-13: 4 mg via INTRAVENOUS
  Filled 2018-12-13: qty 2

## 2018-12-13 NOTE — Progress Notes (Signed)
PROGRESS NOTE    Karen Richard  EML:[id number] DOB: 06/10/1927 DOA: 12/09/2018 PCP: Caren Macadam, MD    Brief Narrative:  85 year old female who presented with altered mental status.She does have significant past medical history for nonalcoholic liver cirrhosis, CLL, heart failure, dyslipidemia and neuromuscular disorder. Patient reported to have confusion and increased falls over the last 4 weeks,associated with generalized weakness and lower extremity edema. On her initial physical examination blood pressure 125/35, heart rate 102, respiratory rate 14, temperature 97.8 F, oxygen saturation 95%.She was awake and alert, heart S1-S2 present and rhythmic, her lungs were clear to auscultation bilaterally, abdomen nontender, positive lower extremity edema 4+ bilaterally lower extremities.  Patient was admitted to the hospital working diagnosis of acute encephalopathy due to acute decompensation of chronic liver failure.   Assessment & Plan:   Principal Problem:   Volume overload Active Problems:   CLL (chronic lymphocytic leukemia) (HCC)   Cirrhosis of liver without ascites (HCC)   Acute encephalopathy   Poor tolerance for ambulation   Fall at home, initial encounter   Liver failure ([place])   1. Acute decompensation of chronic liver failure/calculated MELD score is 27 with 19,6% estimated mortality in 3 months. . Improved volume status, no significant ascites per physical examination, continue spironolactone for diuresis. Patient continue to have episodic confusion, but no asterixis, or delirium features, continue to be alert and orientated. Apparently continue to have 2 to 3 bowel movements per day. Her husband is at the bedside and I explained him in detail patient's poor prognosis related to advance liver disease and likely continue to deteriorate. Continue to follow biopsy report.   2. AKI on CKD stage 3 with hyponatremia. Stable renal function with serum cr at 1,31, with  K at 4,4, will continue diuresis with aldactone. Follow on renal panel in am.   3. History of CLL. Stable leukocytosis. Follow on liver biopsy, leukemia may be associated with elevated ferritin.   4. Deconditioning. Patient continue to have severe deconditioning, plan for snf at discharge.  DVT prophylaxis:enoxaparin Code Status:DNR Family Communication:I spoke with patient'shusbandat the bedside and all questions were addressed.  Disposition Plan/ discharge barriers:pending clinical improvement, liver biopsy results, Plan for SNF.   Body mass index is 27.46 kg/m. Malnutrition Type:      Malnutrition Characteristics:      Nutrition Interventions:     RN Pressure Injury Documentation:     Consultants:   GI   Procedures:     Antimicrobials:       Subjective: Patient has been confused and disorientated, positive nausea, but no vomiting, no dyspnea or chest pain. Positive abdominal distention. Very weak and deconditioned. Patient's mentation has been declining for the last 6 weeks.   Objective: Vitals:   12/12/18 1959 12/12/18 2336 12/13/18 0422 12/13/18 0754  BP: (!) 120/55 (!) 106/56 (!) 118/48 (!) 115/43  Pulse: 91 88 95 91  Resp: 17 18 17 18   Temp: 97.9 F (36.6 C) 97.8 F (36.6 C) 97.6 F (36.4 C) 97.8 F (36.6 C)  TempSrc: Oral Oral Oral Oral  SpO2: 93% 97% 92% 95%  Weight:      Height:        Intake/Output Summary (Last 24 hours) at 12/13/2018 1022 Last data filed at 12/13/2018 0813 Gross per 24 hour  Intake 120 ml  Output 600 ml  Net -480 ml   [employer]   12/10/18 0228  Weight: 70.3 kg    Examination:   General: deconditioned  Neurology: Awake  and alert, non focal  E ENT: positive pallor, and icterus, oral mucosa moist Cardiovascular: No JVD. S1-S2 present, rhythmic, no gallops, rubs, or murmurs. Trace lower extremity edema. Pulmonary: vesicular breath sounds bilaterally, adequate air movement, no wheezing, rhonchi  or rales. Gastrointestinal. Abdomen protuberant with no significant ascites,  no organomegaly, non tender, no rebound or guarding Skin. No rashes Musculoskeletal: no joint deformities     Data Reviewed: I have personally reviewed following labs and imaging studies  CBC: Recent Labs  Lab 12/09/18 1150 12/09/18 1325 12/10/18 0655 12/12/18 0532  WBC 24.8*  --  20.5* 22.6*  NEUTROABS  --  19.6*  --  19.0*  HGB 12.5  --  11.0* 11.3*  HCT 35.7*  --  30.9* 31.7*  MCV 96.7  --  94.2 95.5  PLT 106*  --  95* 92*   Basic Metabolic Panel: Recent Labs  Lab 12/09/18 1150 12/10/18 0449 12/12/18 0532  NA 130* 127* 129*  K 5.0 4.4 4.4  CL 92* 90* 91*  CO2 26 24 30   GLUCOSE 113* 88 105*  BUN 35* 38* 38*  CREATININE 1.73* 1.53* 1.36*  CALCIUM 8.6* 8.6* 8.5*   GFR: Estimated Creatinine Clearance: 26.4 mL/min (A) (by C-G formula based on SCr of 1.36 mg/dL (H)). Liver Function Tests: Recent Labs  Lab 12/09/18 1150  AST 202*  ALT 79*  ALKPHOS 268*  BILITOT 7.0*  PROT 6.0*  ALBUMIN 2.3*   No results for input(s): LIPASE, AMYLASE in the last 168 hours. Recent Labs  Lab 12/09/18 1200  AMMONIA 21   Coagulation Profile: Recent Labs  Lab 12/11/18 1509 12/12/18 0532  INR 1.67 1.67   Cardiac Enzymes: No results for input(s): CKTOTAL, CKMB, CKMBINDEX, TROPONINI in the last 168 hours. BNP (last 3 results) No results for input(s): PROBNP in the last 8760 hours. HbA1C: No results for input(s): HGBA1C in the last 72 hours. CBG: No results for input(s): GLUCAP in the last 168 hours. Lipid Profile: No results for input(s): CHOL, HDL, LDLCALC, TRIG, CHOLHDL, LDLDIRECT in the last 72 hours. Thyroid Function Tests: No results for input(s): TSH, T4TOTAL, FREET4, T3FREE, THYROIDAB in the last 72 hours. Anemia Panel: Recent Labs    12/11/18 0958  FERRITIN 1,427*  TIBC 230*  IRON 206*      Radiology Studies: I have reviewed all of the imaging during this hospital visit  personally     Scheduled Meds: . heparin  5,000 Units Subcutaneous Q8H  . lactulose  15 g Oral TID  . spironolactone  50 mg Oral Daily   Continuous Infusions:   LOS: 2 days        Mauricio [name], MD

## 2018-12-13 NOTE — Plan of Care (Signed)

## 2018-12-13 NOTE — Progress Notes (Signed)
CSW contacted [facility] admissions this morning to verify that they are taking the patient back into SNF when she is stable to return, and that they have started authorization for her. Left a voicemail, awaiting a call back.  CSW to follow.  Laveda Abbe, [place] Clinical Social Worker [phone]

## 2018-12-13 NOTE — Progress Notes (Signed)
Physical Therapy Treatment Patient Details Name: Karen Richard MRN: [id number] DOB: [birth year] Today's Date: 12/13/2018    History of Present Illness Pt is an 85 y/o female admitted secondary to falls at home and found to be in volume overload. PMH including but not limited to nonalcoholic liver cirrhosis, CLL, heart failure, dyslipidemia and neuromuscular disorder.     PT Comments    Pt making limited progress with functional mobility. She continuously reports that she is very weak and needs more time to build up her strength (?self limiting). Pt's MD entering during session. Pt would continue to benefit from skilled physical therapy services at this time while admitted and after d/c to address the below listed limitations in order to improve overall safety and independence with functional mobility.    Follow Up Recommendations  SNF     Equipment Recommendations  None recommended by PT    Recommendations for Other Services       Precautions / Restrictions Precautions Precautions: Fall Restrictions Weight Bearing Restrictions: No    Mobility  Bed Mobility Overal bed mobility: Needs Assistance Bed Mobility: Supine to Sit     Supine to sit: Min guard     General bed mobility comments: HOB elevated, min guard for safety  Transfers Overall transfer level: Needs assistance Equipment used: Rolling walker (2 wheeled) Transfers: Sit to/from Stand Sit to Stand: Min guard         General transfer comment: cueing for safe hand placement, min guard for safety as pt with reported dizziness initially that quickly dissipated  Ambulation/Gait Ambulation/Gait assistance: Min guard Gait Distance (Feet): 15 Feet Assistive device: Rolling walker (2 wheeled) Gait Pattern/deviations: Step-through pattern;Decreased step length - left;Decreased step length - right;Decreased stride length;Shuffle Gait velocity: decreased   General Gait Details: pt with very slow gait with RW,  mild instability but no overt LOB; however, pt significantly limited secondary to fatigue and weakness   Stairs             Wheelchair Mobility    Modified Rankin (Stroke Patients Only)       Balance Overall balance assessment: Needs assistance Sitting-balance support: Feet supported Sitting balance-Leahy Scale: Good     Standing balance support: Bilateral upper extremity supported Standing balance-Leahy Scale: Poor                              Cognition Arousal/Alertness: Awake/alert Behavior During Therapy: Flat affect Overall Cognitive Status: Impaired/Different from baseline Area of Impairment: Safety/judgement;Problem solving                         Safety/Judgement: Decreased awareness of safety;Decreased awareness of deficits   Problem Solving: Decreased initiation;Requires verbal cues        Exercises      General Comments        Pertinent Vitals/Pain Pain Assessment: No/denies pain    Home Living                      Prior Function            PT Goals (current goals can now be found in the care plan section) Acute Rehab PT Goals PT Goal Formulation: With patient Time For Goal Achievement: 12/25/18 Potential to Achieve Goals: Fair Progress towards PT goals: Progressing toward goals    Frequency    Min 2X/week      PT Plan Current  plan remains appropriate    Co-evaluation              AM-PAC PT "6 Clicks" Mobility   Outcome Measure  Help needed turning from your back to your side while in a flat bed without using bedrails?: None Help needed moving from lying on your back to sitting on the side of a flat bed without using bedrails?: None Help needed moving to and from a bed to a chair (including a wheelchair)?: A Little Help needed standing up from a chair using your arms (e.g., wheelchair or bedside chair)?: A Little Help needed to walk in hospital room?: A Lot Help needed climbing 3-5  steps with a railing? : Total 6 Click Score: 17    End of Session Equipment Utilized During Treatment: Gait belt Activity Tolerance: Patient limited by fatigue Patient left: in chair;with call bell/phone within reach;with chair alarm set Nurse Communication: Mobility status PT Visit Diagnosis: Other abnormalities of gait and mobility (R26.89);Muscle weakness (generalized) (M62.81)     Time: 8756-4332 PT Time Calculation (min) (ACUTE ONLY): 10 min  Charges:  $Therapeutic Activity: 8-22 mins                     Sherie Don, [place], DPT  Acute Rehabilitation Services Pager [phone] Office [place] 12/13/2018, 1:30 PM

## 2018-12-13 NOTE — Progress Notes (Signed)
Patient unable to tolerate ensure and lactulose at this time, she is having emesis after these items. MD paged for antienemic.  Ave Filter, RN

## 2018-12-13 NOTE — Care Management Important Message (Signed)
Important Message  Patient Details  Name: Karen Richard MRN: [id number] Date of Birth: [birth year]   Medicare Important Message Given:  Yes    Magdelena Kinsella [address]ine Circle 12/13/2018, 3:55 PM

## 2018-12-13 NOTE — Progress Notes (Signed)
Subjective: The patient was seen and examined at bedside, sitting comfortably on a bedside chair.  Objective: Vital signs in last 24 hours: Temp:  [97.4 F (36.3 C)-98 F (36.7 C)] 97.8 F (36.6 C) (02/14 0754) Pulse Rate:  [82-95] 91 (02/14 0754) Resp:  [10-19] 18 (02/14 0754) BP: (106-183)/(35-104) 115/43 (02/14 0754) SpO2:  [92 %-100 %] 95 % (02/14 0754) Weight change:  Last BM Date: 12/11/18  WU:[id number], mild pallor GENERAL:not in distress, no asterixis ABDOMEN:soft, nondistended, nontender, no shifting dullness or fluid thrill EXTREMITIES:chronic skin changes from lymphedema along with mild pitting pedal edema bilaterally  Lab Results: Results for orders placed or performed during the hospital encounter of 12/09/18 (from the past 48 hour(s))  Iron and TIBC     Status: Abnormal   Collection Time: 12/11/18  9:58 AM  Result Value Ref Range   Iron 206 (H) 28 - 170 ug/dL   TIBC 230 (L) 250 - 450 ug/dL   Saturation Ratios 90 (H) 10.4 - 31.8 %   UIBC 24 ug/dL    Comment: Performed at [facility], [address] [place], [place] [address]  Ferritin     Status: Abnormal   Collection Time: 12/11/18  9:58 AM  Result Value Ref Range   Ferritin 1,427 (H) 11 - 307 ng/mL    Comment: Performed at [facility], [place] [address] [place], [place] [address]  Protime-INR     Status: Abnormal   Collection Time: 12/11/18  3:09 PM  Result Value Ref Range   Prothrombin Time 19.5 (H) 11.4 - 15.2 seconds   INR 1.67     Comment: Performed at [facility] [address], [place], [place] [address]  Basic metabolic panel     Status: Abnormal   Collection Time: 12/12/18  5:32 AM  Result Value Ref Range   Sodium 129 (L) 135 - 145 mmol/L   Potassium 4.4 3.5 - 5.1 mmol/L    Comment: SLIGHT HEMOLYSIS   Chloride 91 (L) 98 - 111 mmol/L   CO2 30 22 - 32 mmol/L   Glucose, Bld 105 (H) 70 - 99 mg/dL   BUN 38 (H) 8 - 23 mg/dL   Creatinine, Ser 1.36 (H) 0.44 - 1.00 mg/dL    Calcium 8.5 (L) 8.9 - 10.3 mg/dL   GFR calc non Af Amer 34 (L) >60 mL/min   GFR calc Af Amer 40 (L) >60 mL/min   Anion gap 8 5 - 15    Comment: Performed at [facility] [address], [place],  [address]  CBC with Differential/Platelet     Status: Abnormal   Collection Time: 12/12/18  5:32 AM  Result Value Ref Range   WBC 22.6 (H) 4.0 - 10.5 K/uL   RBC 3.32 (L) 3.87 - 5.11 MIL/uL   Hemoglobin 11.3 (L) 12.0 - 15.0 g/dL   HCT 31.7 (L) 36.0 - 46.0 %   MCV 95.5 80.0 - 100.0 fL   MCH 34.0 26.0 - 34.0 pg   MCHC 35.6 30.0 - 36.0 g/dL   RDW 17.4 (H) 11.5 - 15.5 %   Platelets 92 (L) 150 - 400 K/uL    Comment: REPEATED TO VERIFY Immature Platelet Fraction may be clinically indicated, consider ordering this additional test [id number] CONSISTENT WITH PREVIOUS RESULT    nRBC 0.0 0.0 - 0.2 %   Neutrophils Relative % 84 %   Neutro Abs 19.0 (H) 1.7 - 7.7 K/uL   Lymphocytes Relative 7 %  Lymphs Abs 1.6 0.7 - 4.0 K/uL   Monocytes Relative 4 %   Monocytes Absolute 0.9 0.1 - 1.0 K/uL   Eosinophils Relative 1 %   Eosinophils Absolute 0.2 0.0 - 0.5 K/uL   Basophils Relative 3 %   Basophils Absolute 0.7 (H) 0.0 - 0.1 K/uL   nRBC 0 0 /100 WBC   Myelocytes 1 %   Abs Immature Granulocytes 0.20 (H) 0.00 - 0.07 K/uL   Burr Cells PRESENT    Polychromasia PRESENT     Comment: Performed at [facility], [place] [address] [place], [place] [address]  Protime-INR     Status: Abnormal   Collection Time: 12/12/18  5:32 AM  Result Value Ref Range   Prothrombin Time 19.5 (H) 11.4 - 15.2 seconds   INR 1.67     Comment: Performed at [facility] [address], [place], [place] [address]  Pathologist smear review     Status: None   Collection Time: 12/12/18  5:32 AM  Result Value Ref Range   Path Review      Consistent with history of Chronic Lymphocytic Leukemia.    Comment: Reviewed by Marlynn Perking. Melina Copa, M.D. 12/12/2018. Performed at [facility], [place] [address]  [place], [place] [address]     Studies/Results: Ir Venogram Hepatic W Hemodynamic Evaluation  Result Date: 12/12/2018 INDICATION: 85 year old with decompensated cirrhosis. Liver biopsy requested to evaluate for autoimmune disease versus Nash versus iron overload. Patient has a slightly elevated INR and has extensive perihepatic ascites. Plan for transjugular liver biopsy with pressures. EXAM: TRANSJUGULAR LIVER BIOPSY WITH PRESSURES HEPATIC VENOGRAPHY ULTRASOUND GUIDANCE FOR VASCULAR ACCESS MEDICATIONS: None. ANESTHESIA/SEDATION: Versed 0.5 mg The patient's level of consciousness and vital signs were monitored continuously by radiology nursing throughout the procedure under my direct supervision. FLUOROSCOPY TIME:  Fluoroscopy Time: 16 minutes, 24 seconds, 51 mGy COMPLICATIONS: None immediate. PROCEDURE: Informed written consent was obtained from the patient after a thorough discussion of the procedural risks, benefits and alternatives. All questions were addressed. Maximal Sterile Barrier Technique was utilized including caps, mask, sterile gowns, sterile gloves, sterile drape, hand hygiene and skin antiseptic. A timeout was performed prior to the initiation of the procedure. Right neck was prepped and draped in sterile fashion. Ultrasound confirmed a patent right internal jugular vein. Ultrasound image was saved for documentation. Neck was anesthetized with 1% lidocaine. 21 gauge needle directed into the right internal jugular vein with ultrasound guidance. Micropuncture dilator set was placed. Bentson wire was advanced into the SVC and IVC. The tract was dilated to accommodate a 10 [place] sheath. MPA catheter was used to cannulate a right hepatic vein and hepatic venography was performed. Series of wedged and free hepatic vein pressures were obtained. The transjugular biopsy kit was advanced into the 10 [place] vascular sheath over a stiff Amplatz wire. It was very difficult to advance the transjugular biopsy kit  into the hepatic venous system due to the small size of the liver. Eventually, the 10 [place] vascular sheath and transjugular biopsy kit were advanced into the right hepatic vein over a stiff Amplatz wire. Four transjugular core biopsies were obtained. Specimens placed in formalin. The transjugular biopsy kit was removed. The vascular sheath was removed with manual compression. Bandage placed over the puncture site. FINDINGS: Wedged hepatic venography demonstrated parenchymal blushing with possible reflux into the portal venous system. Free and wedged hepatic pressures were obtained. Unable to get good waveforms during the hepatic vein pressure measurements. Hepatic venous  pressure gradient appeared to be markedly elevated measuring roughly 20 but not clear if this is an accurate measurement. Four adequate core specimens were obtained. IMPRESSION: Transjugular liver biopsy.  Four core biopsies were obtained. Hepatic venous pressure gradient appears to be markedly elevated and suggestive for portal hypertension but not clear if these measurement are accurate. Electronically Signed   By: Markus Daft M.D.   On: 12/12/2018 18:20   Ir Transcatheter Bx  Result Date: 12/12/2018 INDICATION: 85 year old with decompensated cirrhosis. Liver biopsy requested to evaluate for autoimmune disease versus Nash versus iron overload. Patient has a slightly elevated INR and has extensive perihepatic ascites. Plan for transjugular liver biopsy with pressures. EXAM: TRANSJUGULAR LIVER BIOPSY WITH PRESSURES HEPATIC VENOGRAPHY ULTRASOUND GUIDANCE FOR VASCULAR ACCESS MEDICATIONS: None. ANESTHESIA/SEDATION: Versed 0.5 mg The patient's level of consciousness and vital signs were monitored continuously by radiology nursing throughout the procedure under my direct supervision. FLUOROSCOPY TIME:  Fluoroscopy Time: 16 minutes, 24 seconds, 51 mGy COMPLICATIONS: None immediate. PROCEDURE: Informed written consent was obtained from the patient  after a thorough discussion of the procedural risks, benefits and alternatives. All questions were addressed. Maximal Sterile Barrier Technique was utilized including caps, mask, sterile gowns, sterile gloves, sterile drape, hand hygiene and skin antiseptic. A timeout was performed prior to the initiation of the procedure. Right neck was prepped and draped in sterile fashion. Ultrasound confirmed a patent right internal jugular vein. Ultrasound image was saved for documentation. Neck was anesthetized with 1% lidocaine. 21 gauge needle directed into the right internal jugular vein with ultrasound guidance. Micropuncture dilator set was placed. Bentson wire was advanced into the SVC and IVC. The tract was dilated to accommodate a 10 [place] sheath. MPA catheter was used to cannulate a right hepatic vein and hepatic venography was performed. Series of wedged and free hepatic vein pressures were obtained. The transjugular biopsy kit was advanced into the 10 [place] vascular sheath over a stiff Amplatz wire. It was very difficult to advance the transjugular biopsy kit into the hepatic venous system due to the small size of the liver. Eventually, the 10 [place] vascular sheath and transjugular biopsy kit were advanced into the right hepatic vein over a stiff Amplatz wire. Four transjugular core biopsies were obtained. Specimens placed in formalin. The transjugular biopsy kit was removed. The vascular sheath was removed with manual compression. Bandage placed over the puncture site. FINDINGS: Wedged hepatic venography demonstrated parenchymal blushing with possible reflux into the portal venous system. Free and wedged hepatic pressures were obtained. Unable to get good waveforms during the hepatic vein pressure measurements. Hepatic venous pressure gradient appeared to be markedly elevated measuring roughly 20 but not clear if this is an accurate measurement. Four adequate core specimens were obtained. IMPRESSION:  Transjugular liver biopsy.  Four core biopsies were obtained. Hepatic venous pressure gradient appears to be markedly elevated and suggestive for portal hypertension but not clear if these measurement are accurate. Electronically Signed   By: Markus Daft M.D.   On: 12/12/2018 18:20   Ir US Guide Vasc Access Right  Result Date: 12/12/2018 INDICATION: 85 year old with decompensated cirrhosis. Liver biopsy requested to evaluate for autoimmune disease versus Nash versus iron overload. Patient has a slightly elevated INR and has extensive perihepatic ascites. Plan for transjugular liver biopsy with pressures. EXAM: TRANSJUGULAR LIVER BIOPSY WITH PRESSURES HEPATIC VENOGRAPHY ULTRASOUND GUIDANCE FOR VASCULAR ACCESS MEDICATIONS: None. ANESTHESIA/SEDATION: Versed 0.5 mg The patient's level of consciousness and vital signs were monitored continuously by radiology nursing throughout the procedure  under my direct supervision. FLUOROSCOPY TIME:  Fluoroscopy Time: 16 minutes, 24 seconds, 51 mGy COMPLICATIONS: None immediate. PROCEDURE: Informed written consent was obtained from the patient after a thorough discussion of the procedural risks, benefits and alternatives. All questions were addressed. Maximal Sterile Barrier Technique was utilized including caps, mask, sterile gowns, sterile gloves, sterile drape, hand hygiene and skin antiseptic. A timeout was performed prior to the initiation of the procedure. Right neck was prepped and draped in sterile fashion. Ultrasound confirmed a patent right internal jugular vein. Ultrasound image was saved for documentation. Neck was anesthetized with 1% lidocaine. 21 gauge needle directed into the right internal jugular vein with ultrasound guidance. Micropuncture dilator set was placed. Bentson wire was advanced into the SVC and IVC. The tract was dilated to accommodate a 10 [place] sheath. MPA catheter was used to cannulate a right hepatic vein and hepatic venography was performed.  Series of wedged and free hepatic vein pressures were obtained. The transjugular biopsy kit was advanced into the 10 [place] vascular sheath over a stiff Amplatz wire. It was very difficult to advance the transjugular biopsy kit into the hepatic venous system due to the small size of the liver. Eventually, the 10 [place] vascular sheath and transjugular biopsy kit were advanced into the right hepatic vein over a stiff Amplatz wire. Four transjugular core biopsies were obtained. Specimens placed in formalin. The transjugular biopsy kit was removed. The vascular sheath was removed with manual compression. Bandage placed over the puncture site. FINDINGS: Wedged hepatic venography demonstrated parenchymal blushing with possible reflux into the portal venous system. Free and wedged hepatic pressures were obtained. Unable to get good waveforms during the hepatic vein pressure measurements. Hepatic venous pressure gradient appeared to be markedly elevated measuring roughly 20 but not clear if this is an accurate measurement. Four adequate core specimens were obtained. IMPRESSION: Transjugular liver biopsy.  Four core biopsies were obtained. Hepatic venous pressure gradient appears to be markedly elevated and suggestive for portal hypertension but not clear if these measurement are accurate. Electronically Signed   By: Markus Daft M.D.   On: 12/12/2018 18:20    Medications: I have reviewed the patient's current medications.  Assessment: Decompensated cirrhosis related to any end-stage versus secondary iron overload Transjugular liver biopsy performed yesterday, results pending  History of hepatic encephalopathy, currently oriented to time place and person, intermittently confused( states they wrapped a plastic bag around her head during liver biopsy yesterday)  Ascites, no evidence of volume overload currently, maintained on spironolactone 50 mg daily Hyponatremia, impaired renal function with GFR of 34  CLL,  persistently elevated leukocytosis,thrombocytopenia  Plan: Not much to add to current treatment Continue spironolactone 50 mg a day and lactulose 15 g 3 times a day and monitor renal function Liver biopsy may be followed as outpatient. Okay to discharge back to long-term facility when approved by physical therapy Recommend follow-up with me in the office in 2 weeks post discharge Patient is not a liver transplant candidate, elevated MELD sodium score, poor prognosis   Ronnette Juniper 12/13/2018, 9:29 AM   Pager [phone] If no answer or after 5 PM call [phone]

## 2018-12-13 NOTE — Progress Notes (Signed)
Initial Nutrition Assessment  DOCUMENTATION CODES:   Not applicable  INTERVENTION:  Provide Ensure Enlive po BID (mixed with ice cream), each supplement provides 350 kcal and 20 grams of protein.  Encourage adequate PO intake.   NUTRITION DIAGNOSIS:   Increased nutrient needs related to chronic illness(cirrhosis) as evidenced by estimated needs.  GOAL:   Patient will meet greater than or equal to 90% of their needs  MONITOR:   PO intake, Labs, Supplement acceptance, Weight trends, I & O's, Skin  REASON FOR ASSESSMENT:   Consult Assessment of nutrition requirement/status  ASSESSMENT:   85 year old female who presented with altered mental status.  She does have significant past medical history for nonalcoholic liver cirrhosis, CLL, heart failure, dyslipidemia and neuromuscular disorder. Patient was admitted to the hospital working diagnosis of acute encephalopathy due to acute decompensation of chronic liver failure.  [place] confirms a large amount of perihepatic ascites.  Husband at bedside reports pt with poor appetite and intake over the past 6 weeks and would only take bites of food at meals. Pt does consume vanilla flavor Boost Plus shakes 1-2 times daily at home. Pt only consumed some apple slices at breakfast this AM. Pt reports weight loss recently, however related to fluid status as pt has been volume overloaded. Husband has been encouraging pt po intake at meals. Pt agreeable to nutritional supplements, however requests ice cream to be mixed in it. RD to order. Labs and medications reviewed.   NUTRITION - FOCUSED PHYSICAL EXAM: Mild depletion likely related to natural aging process.    Most Recent Value  Orbital Region  Mild depletion  Upper Arm Region  No depletion  Thoracic and Lumbar Region  No depletion  Buccal Region  No depletion  Temple Region  Mild depletion  Clavicle Bone Region  Mild depletion  Clavicle and Acromion Bone Region  Mild depletion  Scapular Bone  Region  Unable to assess  Dorsal Hand  No depletion  Patellar Region  No depletion  Anterior Thigh Region  No depletion  Posterior Calf Region  No depletion  Edema (RD Assessment)  Moderate  Hair  Reviewed  Eyes  Reviewed  Mouth  Reviewed  Skin  Reviewed  Nails  Reviewed       Diet Order:   Diet Order            Diet regular Room service appropriate? Yes; Fluid consistency: Thin  Diet effective now              EDUCATION NEEDS:   Not appropriate for education at this time  Skin:  Skin Assessment: Reviewed RN Assessment  Last BM:  2/12  Height:   Ht Readings from Last 1 Encounters:  12/10/18 5' 3"  (1.6 m)    Weight:   Wt Readings from Last 1 Encounters:  12/10/18 70.3 kg    Ideal Body Weight:  52.27 kg  BMI:  Body mass index is 27.46 kg/m.  Estimated Nutritional Needs:   Kcal:  1550-1750  Protein:  80-90 grams  Fluid:  Per MD    Corrin Parker, MS, RD, LDN Pager # [phone] After hours/ weekend pager # [phone]

## 2018-12-13 NOTE — Progress Notes (Signed)
Patient verbalized that she changed her mind and no longer wanted medication for nausea. She stated that after she threw up her Ensure drink, she felt better. She no longer wants to drink Ensure. Will continue to monitor.

## 2018-12-14 ENCOUNTER — Inpatient Hospital Stay (HOSPITAL_COMMUNITY): Payer: Medicare Other

## 2018-12-14 LAB — BASIC METABOLIC PANEL
Anion gap: 8 (ref 5–15)
BUN: 32 mg/dL — ABNORMAL HIGH (ref 8–23)
CALCIUM: 8.8 mg/dL — AB (ref 8.9–10.3)
CO2: 28 mmol/L (ref 22–32)
Chloride: 95 mmol/L — ABNORMAL LOW (ref 98–111)
Creatinine, Ser: 1.22 mg/dL — ABNORMAL HIGH (ref 0.44–1.00)
GFR calc Af Amer: 45 mL/min — ABNORMAL LOW (ref >60)
GFR, EST NON AFRICAN AMERICAN: 39 mL/min — AB (ref >60)
Glucose, Bld: 103 mg/dL — ABNORMAL HIGH (ref 70–99)
Potassium: 5.3 mmol/L — ABNORMAL HIGH (ref 3.5–5.1)
Sodium: 131 mmol/L — ABNORMAL LOW (ref 135–145)

## 2018-12-14 MED ORDER — FUROSEMIDE 40 MG PO TABS
40.0000 mg | ORAL_TABLET | Freq: Every day | ORAL | Status: DC
  Administered 2018-12-14 – 2018-12-15 (×2): 40 mg via ORAL
  Filled 2018-12-14 (×2): qty 1

## 2018-12-14 NOTE — Progress Notes (Addendum)
PROGRESS NOTE    Karen Richard  WGN:[id number] DOB: 03/22/1927 DOA: 12/09/2018 PCP: Caren Macadam, MD    Brief Narrative:  85 year old female who presented with altered mental status.She does have significant past medical history for nonalcoholic liver cirrhosis, CLL, heart failure, dyslipidemia and neuromuscular disorder. Patient reported to have confusion and increased falls over the last 4 weeks,associated with generalized weakness and lower extremity edema. On her initial physical examination blood pressure 125/35, heart rate 102, respiratory rate 14, temperature 97.8 F, oxygen saturation 95%.She was awake and alert, heart S1-S2 present and rhythmic, her lungs were clear to auscultation bilaterally, abdomen nontender, positive lower extremity edema 4+ bilaterally lower extremities.  Patient was admitted to the hospital working diagnosis of acute encephalopathy due to acute decompensation of chronic liver failure.   Assessment & Plan:   Principal Problem:   Volume overload Active Problems:   CLL (chronic lymphocytic leukemia) (HCC)   Cirrhosis of liver without ascites (HCC)   Acute encephalopathy   Poor tolerance for ambulation   Fall at home, initial encounter   Liver failure ([place])  1. Acute decompensation of chronic liver failure/calculated MELD score is 27 with 19,6% estimated mortality in 3 months. . This am patient with persistent nausea, her abdomen is more distended, possible ascites. Will request US guided paracentesis, and will follow on fluid analysis. Patient with hyperkalemia, will change spironolactone for furosemide. Patient is very weak and deconditioned, poor prognosis. Continue lactulose tid for now. Pending liver biopsy. Continue nutrition supplementation.   2. AKI on CKD stage 3 with hyponatremia. Renal function with serum cr at 1,22 with K at 5,3. Worsening hyperkalemia, will discontinue spironolactone and will start patient on furosemide for  diuresis,    3. History of CLL. Will need outpatient follow up.   4. Deconditioning.Continue to be very weak and deconditioned, will need snf at discharge.  DVT prophylaxis:enoxaparin Code Status:DNR Family Communication:I spoke with patient'shusbandat the bedside and all questions were addressed.  Disposition Plan/ discharge barriers: Plan for SNF.   Body mass index is 29.41 kg/m. Malnutrition Type:  Nutrition Problem: Increased nutrient needs Etiology: chronic illness(cirrhosis)   Malnutrition Characteristics:  Signs/Symptoms: estimated needs   Nutrition Interventions:  Interventions: Ensure Enlive (each supplement provides 350kcal and 20 grams of protein)  RN Pressure Injury Documentation:     Consultants:   GI   Procedures:     Antimicrobials:       Subjective: Patient has been reporting persistent nausea, no frank vomiting but significant decrease in po intake, continue to be weak and deconditioned.   Objective: Vitals:   12/14/18 0040 12/14/18 0500 12/14/18 0511 12/14/18 0800  BP: (!) 133/47  (!) 127/38 (!) 130/47  Pulse: 95  98 93  Resp: 18  18 18   Temp: 97.7 F (36.5 C)  98.7 F (37.1 C) 98.4 F (36.9 C)  TempSrc: Oral  Oral Oral  SpO2: 95%  94% 93%  Weight:  75.3 kg    Height:        Intake/Output Summary (Last 24 hours) at 12/14/2018 7846 Last data filed at 12/13/2018 1700 Gross per 24 hour  Intake 600 ml  Output 800 ml  Net -200 ml   Filed Weights   12/10/18 0228 12/14/18 0500  Weight: 70.3 kg 75.3 kg    Examination:   General: deconditioned  Neurology: Awake and alert, non focal  E ENT: positive pallor, positive icterus, oral mucosa dry.  Cardiovascular: No JVD. S1-S2 present, rhythmic, no gallops, rubs, or murmurs. +/++  bilateral lower extremity edema. Pulmonary: decreased breath sounds bilaterally at bases, poor air movement, no wheezing, rhonchi or rales. Gastrointestinal. Abdomen distended, more than  yesterday, positive ascites,  no organomegaly, non tender, no rebound or guarding Skin. No rashes Musculoskeletal: no joint deformities     Data Reviewed: I have personally reviewed following labs and imaging studies  CBC: Recent Labs  Lab 12/09/18 1150 12/09/18 1325 12/10/18 0655 12/12/18 0532  WBC 24.8*  --  20.5* 22.6*  NEUTROABS  --  19.6*  --  19.0*  HGB 12.5  --  11.0* 11.3*  HCT 35.7*  --  30.9* 31.7*  MCV 96.7  --  94.2 95.5  PLT 106*  --  95* 92*   Basic Metabolic Panel: Recent Labs  Lab 12/09/18 1150 12/10/18 0449 12/12/18 0532 12/13/18 1038 12/14/18 0643  NA 130* 127* 129* 131* 131*  K 5.0 4.4 4.4 4.4 5.3*  CL 92* 90* 91* 91* 95*  CO2 26 24 30 27 28   GLUCOSE 113* 88 105* 153* 103*  BUN 35* 38* 38* 33* 32*  CREATININE 1.73* 1.53* 1.36* 1.31* 1.22*  CALCIUM 8.6* 8.6* 8.5* 9.1 8.8*   GFR: Estimated Creatinine Clearance: 30.4 mL/min (A) (by C-G formula based on SCr of 1.22 mg/dL (H)). Liver Function Tests: Recent Labs  Lab 12/09/18 1150  AST 202*  ALT 79*  ALKPHOS 268*  BILITOT 7.0*  PROT 6.0*  ALBUMIN 2.3*   No results for input(s): LIPASE, AMYLASE in the last 168 hours. Recent Labs  Lab 12/09/18 1200  AMMONIA 21   Coagulation Profile: Recent Labs  Lab 12/11/18 1509 12/12/18 0532  INR 1.67 1.67   Cardiac Enzymes: No results for input(s): CKTOTAL, CKMB, CKMBINDEX, TROPONINI in the last 168 hours. BNP (last 3 results) No results for input(s): PROBNP in the last 8760 hours. HbA1C: No results for input(s): HGBA1C in the last 72 hours. CBG: No results for input(s): GLUCAP in the last 168 hours. Lipid Profile: No results for input(s): CHOL, HDL, LDLCALC, TRIG, CHOLHDL, LDLDIRECT in the last 72 hours. Thyroid Function Tests: No results for input(s): TSH, T4TOTAL, FREET4, T3FREE, THYROIDAB in the last 72 hours. Anemia Panel: Recent Labs    12/11/18 0958  FERRITIN 1,427*  TIBC 230*  IRON 206*      Radiology Studies: I have  reviewed all of the imaging during this hospital visit personally     Scheduled Meds: . feeding supplement (ENSURE ENLIVE)  237 mL Oral BID BM  . heparin  5,000 Units Subcutaneous Q8H  . lactulose  15 g Oral TID  . spironolactone  50 mg Oral Daily   Continuous Infusions:   LOS: 3 days        Karen Richard [name], MD

## 2018-12-15 LAB — BASIC METABOLIC PANEL
Anion gap: 12 (ref 5–15)
BUN: 37 mg/dL — AB (ref 8–23)
CO2: 26 mmol/L (ref 22–32)
CREATININE: 1.4 mg/dL — AB (ref 0.44–1.00)
Calcium: 8.8 mg/dL — ABNORMAL LOW (ref 8.9–10.3)
Chloride: 91 mmol/L — ABNORMAL LOW (ref 98–111)
GFR calc Af Amer: 39 mL/min — ABNORMAL LOW (ref >60)
GFR calc non Af Amer: 33 mL/min — ABNORMAL LOW (ref >60)
Glucose, Bld: 83 mg/dL (ref 70–99)
Potassium: 4.7 mmol/L (ref 3.5–5.1)
Sodium: 129 mmol/L — ABNORMAL LOW (ref 135–145)

## 2018-12-15 NOTE — Progress Notes (Signed)
PROGRESS NOTE    Karen Richard  OZD:[id number] DOB: February 26, 1927 DOA: 12/09/2018 PCP: Caren Macadam, MD    Brief Narrative:  85 year old female who presented with altered mental status.She does have significant past medical history for nonalcoholic liver cirrhosis, CLL, heart failure, dyslipidemia and neuromuscular disorder. Patient reported to have confusion and increased falls over the last 4 weeks,associated with generalized weakness and lower extremity edema. On her initial physical examination blood pressure 125/35, heart rate 102, respiratory rate 14, temperature 97.8 F, oxygen saturation 95%.She was awake and alert, heart S1-S2 present and rhythmic, her lungs were clear to auscultation bilaterally, abdomen nontender, positive lower extremity edema 4+ bilaterally lower extremities.  Patient was admitted to the hospital working diagnosis of acute encephalopathy due to acute decompensation of chronic liver failure.   Assessment & Plan:   Principal Problem:   Volume overload Active Problems:   CLL (chronic lymphocytic leukemia) (HCC)   Cirrhosis of liver without ascites (HCC)   Acute encephalopathy   Poor tolerance for ambulation   Fall at home, initial encounter   Liver failure ([place])   1. Acute decompensation of chronic liver failure/calculated MELD score is 27 with 19,6% estimated mortality in 3 months..patient continue to have poor oral intake, has been refusing to take lactulose. She was able to eat breakfast this am. No signs of worsening confusion, US of the abdomen with no ascites. [place] pending liver biopsy, high ferritin with possible hemochromatosis, but more likely NASH.   2. AKI on CKD stage 3 with hyponatremia. Renal function with serum cr at 1,40 from 1,22, K down to 4,7, will continue to follow renal panel in am, will hold on furosemide for now. Patient with very poor oral intake. Serum Na down to 129 this am.   3. History of CLL. Cell count has been  stable. Will need outpatient follow up.    4. Deconditioning.Plan for discharge to snf in 24 to 21 H.  DVT prophylaxis:enoxaparin Code Status:DNR Family Communication:I spoke with patient'shusbandat the bedside and all questions were addressed.  Disposition Plan/ discharge barriers: Plan for SNF.  Body mass index is 28.82 kg/m. Malnutrition Type:  Nutrition Problem: Increased nutrient needs Etiology: chronic illness(cirrhosis)   Malnutrition Characteristics:  Signs/Symptoms: estimated needs   Nutrition Interventions:  Interventions: Ensure Enlive (each supplement provides 350kcal and 20 grams of protein)  RN Pressure Injury Documentation:     Consultants:   GI   Procedures:     Antimicrobials:       Subjective: Patient is feeling better but still very weak and deconditioned, her appetite has improved this am, with no further nausea or vomiting. US of the abdomen with no significant ascites.   Objective: Vitals:   12/14/18 2358 12/15/18 0108 12/15/18 0434 12/15/18 0735  BP: (!) 118/40 (!) 116/58 (!) 126/45 (!) 112/45  Pulse: 94 88 90 91  Resp: 17 16 18 16   Temp: 98.4 F (36.9 C)  98.4 F (36.9 C) (!) 97.5 F (36.4 C)  TempSrc: Oral  Oral Oral  SpO2: 92%  96% 94%  Weight:   73.8 kg   Height:        Intake/Output Summary (Last 24 hours) at 12/15/2018 0922 Last data filed at 12/15/2018 0845 Gross per 24 hour  Intake 240 ml  Output -  Net 240 ml   Filed Weights   12/10/18 0228 12/14/18 0500 12/15/18 0434  Weight: 70.3 kg 75.3 kg 73.8 kg    Examination:   General: deconditioned and ill looking appearing  Neurology: Awake and alert, non focal  E ENT: positive pallor, no icterus, oral mucosa moist Cardiovascular: No JVD. S1-S2 present, rhythmic, no gallops, rubs, or murmurs. No lower extremity edema. Pulmonary: positive breath sounds bilaterally, adequate air movement, no wheezing, rhonchi or rales. Gastrointestinal. Abdomen  protuberant with no organomegaly, non tender, no rebound or guarding Skin. No rashes Musculoskeletal: no joint deformities     Data Reviewed: I have personally reviewed following labs and imaging studies  CBC: Recent Labs  Lab 12/09/18 1150 12/09/18 1325 12/10/18 0655 12/12/18 0532  WBC 24.8*  --  20.5* 22.6*  NEUTROABS  --  19.6*  --  19.0*  HGB 12.5  --  11.0* 11.3*  HCT 35.7*  --  30.9* 31.7*  MCV 96.7  --  94.2 95.5  PLT 106*  --  95* 92*   Basic Metabolic Panel: Recent Labs  Lab 12/09/18 1150 12/10/18 0449 12/12/18 0532 12/13/18 1038 12/14/18 0643  NA 130* 127* 129* 131* 131*  K 5.0 4.4 4.4 4.4 5.3*  CL 92* 90* 91* 91* 95*  CO2 26 24 30 27 28   GLUCOSE 113* 88 105* 153* 103*  BUN 35* 38* 38* 33* 32*  CREATININE 1.73* 1.53* 1.36* 1.31* 1.22*  CALCIUM 8.6* 8.6* 8.5* 9.1 8.8*   GFR: Estimated Creatinine Clearance: 30.1 mL/min (A) (by C-G formula based on SCr of 1.22 mg/dL (H)). Liver Function Tests: Recent Labs  Lab 12/09/18 1150  AST 202*  ALT 79*  ALKPHOS 268*  BILITOT 7.0*  PROT 6.0*  ALBUMIN 2.3*   No results for input(s): LIPASE, AMYLASE in the last 168 hours. Recent Labs  Lab 12/09/18 1200  AMMONIA 21   Coagulation Profile: Recent Labs  Lab 12/11/18 1509 12/12/18 0532  INR 1.67 1.67   Cardiac Enzymes: No results for input(s): CKTOTAL, CKMB, CKMBINDEX, TROPONINI in the last 168 hours. BNP (last 3 results) No results for input(s): PROBNP in the last 8760 hours. HbA1C: No results for input(s): HGBA1C in the last 72 hours. CBG: No results for input(s): GLUCAP in the last 168 hours. Lipid Profile: No results for input(s): CHOL, HDL, LDLCALC, TRIG, CHOLHDL, LDLDIRECT in the last 72 hours. Thyroid Function Tests: No results for input(s): TSH, T4TOTAL, FREET4, T3FREE, THYROIDAB in the last 72 hours. Anemia Panel: No results for input(s): VITAMINB12, FOLATE, FERRITIN, TIBC, IRON, RETICCTPCT in the last 72 hours.    Radiology Studies: I  have reviewed all of the imaging during this hospital visit personally     Scheduled Meds: . feeding supplement (ENSURE ENLIVE)  237 mL Oral BID BM  . furosemide  40 mg Oral Daily  . heparin  5,000 Units Subcutaneous Q8H  . lactulose  15 g Oral TID   Continuous Infusions:   LOS: 4 days        Orrin Yurkovich [name], MD

## 2018-12-16 LAB — BASIC METABOLIC PANEL
Anion gap: 10 (ref 5–15)
BUN: 34 mg/dL — ABNORMAL HIGH (ref 8–23)
CALCIUM: 8.5 mg/dL — AB (ref 8.9–10.3)
CO2: 26 mmol/L (ref 22–32)
CREATININE: 1.39 mg/dL — AB (ref 0.44–1.00)
Chloride: 91 mmol/L — ABNORMAL LOW (ref 98–111)
GFR calc Af Amer: 39 mL/min — ABNORMAL LOW (ref >60)
GFR calc non Af Amer: 34 mL/min — ABNORMAL LOW (ref >60)
Glucose, Bld: 199 mg/dL — ABNORMAL HIGH (ref 70–99)
Potassium: 4.7 mmol/L (ref 3.5–5.1)
Sodium: 127 mmol/L — ABNORMAL LOW (ref 135–145)

## 2018-12-16 MED ORDER — ALPRAZOLAM 0.5 MG PO TABS
1.0000 mg | ORAL_TABLET | Freq: Three times a day (TID) | ORAL | Status: DC | PRN
  Filled 2018-12-16: qty 2

## 2018-12-16 NOTE — Progress Notes (Signed)
PROGRESS NOTE    Karen Richard  QPR:[id number] DOB: Jul 11, 1927 DOA: 12/09/2018 PCP: Caren Macadam, MD    Brief Narrative:  85 year old female who presented with altered mental status.She does have significant past medical history for nonalcoholic liver cirrhosis, CLL, heart failure, dyslipidemia and neuromuscular disorder. Patient reported to have confusion and increased falls over the last 4 weeks,associated with generalized weakness and lower extremity edema. On her initial physical examination blood pressure 125/35, heart rate 102, respiratory rate 14, temperature 97.8 F, oxygen saturation 95%.She was awake and alert, heart S1-S2 present and rhythmic, her lungs were clear to auscultation bilaterally, abdomen nontender, positive lower extremity edema 4+ bilaterally lower extremities.  Patient was admitted to the hospital working diagnosis of acute encephalopathy due to acute decompensation of chronic liver failure   Assessment & Plan:   Principal Problem:   Volume overload Active Problems:   CLL (chronic lymphocytic leukemia) (HCC)   Cirrhosis of liver without ascites (HCC)   Acute encephalopathy   Poor tolerance for ambulation   Fall at home, initial encounter   Liver failure ([place])   1. Acute decompensation of chronic liver failure/calculated MELD score is 27 with 19,6% estimated mortality in 3 months.Still pending liver biopsy. Patient continue to have very poor oral intake, her serum cr has been worsening. She is confused but no clinical signs of hepatic encephalopathy like asterixis and somnolence. Will hold on lactulose for now.   2. AKI on CKD stage 3 with hyponatremia.Her renal function has been worsening as yesterday, will hold on lactulose and furosemide for now, will follow on Na and cr in am. Patient continue to have very poor oral intake.   3. History of CLL.will need outpatient follow up.   4. Deconditioning.Plan for discharge to snf in 24, if  renal function and electrolytes stable.   DVT prophylaxis:enoxaparin Code Status:DNR Family Communication:I spoke with patient'shusbandat the bedside and all questions were addressed.  Disposition Plan/ discharge barriers: Plan for SNF.  Body mass index is 29.02 kg/m. Malnutrition Type:  Nutrition Problem: Increased nutrient needs Etiology: chronic illness(cirrhosis)   Malnutrition Characteristics:  Signs/Symptoms: estimated needs   Nutrition Interventions:  Interventions: Ensure Enlive (each supplement provides 350kcal and 20 grams of protein)  RN Pressure Injury Documentation:     Consultants:     Procedures:     Antimicrobials:       Subjective: Patient this am is very agitated and upset, has been refusing medications, and continue to have very poor oral intake. Has confusion and disorientation, that seem to be her baseline, her husband is at the bedside.   Objective: Vitals:   12/16/18 0330 12/16/18 0334 12/16/18 0819 12/16/18 1100  BP: (!) 115/45  (!) 121/45 (!) 119/43  Pulse: 91  87 84  Resp: 18  18 18   Temp: 97.9 F (36.6 C)  98.1 F (36.7 C) 97.6 F (36.4 C)  TempSrc: Oral  Oral Oral  SpO2: 97%  95% 98%  Weight:  74.3 kg    Height:        Intake/Output Summary (Last 24 hours) at 12/16/2018 1429 Last data filed at 12/15/2018 1800 Gross per 24 hour  Intake 240 ml  Output -  Net 240 ml   Filed Weights   12/14/18 0500 12/15/18 0434 12/16/18 0334  Weight: 75.3 kg 73.8 kg 74.3 kg    Examination:   General: deconditioned  Neurology: Awake and alert, non focal  E ENT: positive pallor, with positive icterus, oral mucosa moist Cardiovascular: No JVD.  S1-S2 present, rhythmic, no gallops, rubs, or murmurs. Trace lower extremity edema. Pulmonary: positive breath sounds bilaterally, adequate air movement, no wheezing, rhonchi or rales. Gastrointestinal. Abdomen protuberant with no organomegaly, non tender, no rebound or  guarding Skin. No rashes Musculoskeletal: no joint deformities     Data Reviewed: I have personally reviewed following labs and imaging studies  CBC: Recent Labs  Lab 12/10/18 0655 12/12/18 0532  WBC 20.5* 22.6*  NEUTROABS  --  19.0*  HGB 11.0* 11.3*  HCT 30.9* 31.7*  MCV 94.2 95.5  PLT 95* 92*   Basic Metabolic Panel: Recent Labs  Lab 12/10/18 0449 12/12/18 0532 12/13/18 1038 12/14/18 0643 12/15/18 0748  NA 127* 129* 131* 131* 129*  K 4.4 4.4 4.4 5.3* 4.7  CL 90* 91* 91* 95* 91*  CO2 24 30 27 28 26   GLUCOSE 88 105* 153* 103* 83  BUN 38* 38* 33* 32* 37*  CREATININE 1.53* 1.36* 1.31* 1.22* 1.40*  CALCIUM 8.6* 8.5* 9.1 8.8* 8.8*   GFR: Estimated Creatinine Clearance: 26.3 mL/min (A) (by C-G formula based on SCr of 1.4 mg/dL (H)). Liver Function Tests: No results for input(s): AST, ALT, ALKPHOS, BILITOT, PROT, ALBUMIN in the last 168 hours. No results for input(s): LIPASE, AMYLASE in the last 168 hours. No results for input(s): AMMONIA in the last 168 hours. Coagulation Profile: Recent Labs  Lab 12/11/18 1509 12/12/18 0532  INR 1.67 1.67   Cardiac Enzymes: No results for input(s): CKTOTAL, CKMB, CKMBINDEX, TROPONINI in the last 168 hours. BNP (last 3 results) No results for input(s): PROBNP in the last 8760 hours. HbA1C: No results for input(s): HGBA1C in the last 72 hours. CBG: No results for input(s): GLUCAP in the last 168 hours. Lipid Profile: No results for input(s): CHOL, HDL, LDLCALC, TRIG, CHOLHDL, LDLDIRECT in the last 72 hours. Thyroid Function Tests: No results for input(s): TSH, T4TOTAL, FREET4, T3FREE, THYROIDAB in the last 72 hours. Anemia Panel: No results for input(s): VITAMINB12, FOLATE, FERRITIN, TIBC, IRON, RETICCTPCT in the last 72 hours.    Radiology Studies: I have reviewed all of the imaging during this hospital visit personally     Scheduled Meds: . feeding supplement (ENSURE ENLIVE)  237 mL Oral BID BM  . heparin  5,000  Units Subcutaneous Q8H   Continuous Infusions:   LOS: 5 days         [name], MD

## 2018-12-16 NOTE — Progress Notes (Signed)
PT Cancellation Note  Patient Details Name: Karen Richard MRN: [id number] DOB: 06/30/1927   Cancelled Treatment:    Reason Eval/Treat Not Completed: Patient declined, no reason specified Pt declined working with PT today; "have you ever been frustrated?" "I was supposed to be out of here yesterday after church and I am still here." Reports having diarrhea and not wanting to get up. Will follow.   Marguarite Arbour A Tennyson Wacha 12/16/2018, 2:33 PM Wray Kearns, PT, DPT Acute Rehabilitation Services Pager [phone] Office [phone]

## 2018-12-16 NOTE — Progress Notes (Signed)
CSW following for discharge plan. CSW received call back from Admissions at [facility] over the weekend that they are prepared to take the patient back to SNF when stable. CSW met with patient's husband at the bedside to update him, and patient was very confused, saying that she going home today. Patient's husband appreciative of update.   CSW to follow.  Laveda Abbe, [place] Clinical Social Worker [phone]

## 2018-12-17 LAB — BASIC METABOLIC PANEL
Anion gap: 9 (ref 5–15)
BUN: 29 mg/dL — ABNORMAL HIGH (ref 8–23)
CO2: 29 mmol/L (ref 22–32)
Calcium: 8.5 mg/dL — ABNORMAL LOW (ref 8.9–10.3)
Chloride: 91 mmol/L — ABNORMAL LOW (ref 98–111)
Creatinine, Ser: 1.24 mg/dL — ABNORMAL HIGH (ref 0.44–1.00)
GFR calc Af Amer: 45 mL/min — ABNORMAL LOW (ref >60)
GFR calc non Af Amer: 38 mL/min — ABNORMAL LOW (ref >60)
Glucose, Bld: 108 mg/dL — ABNORMAL HIGH (ref 70–99)
POTASSIUM: 4.5 mmol/L (ref 3.5–5.1)
Sodium: 129 mmol/L — ABNORMAL LOW (ref 135–145)

## 2018-12-17 MED ORDER — LACTULOSE 10 GM/15ML PO SOLN: 10.0000 g | Freq: Two times a day (BID) | ORAL | 0 refills | Status: AC

## 2018-12-17 MED ORDER — LACTULOSE 10 GM/15ML PO SOLN
10.0000 g | Freq: Two times a day (BID) | ORAL | Status: DC
  Administered 2018-12-17: 10 g via ORAL
  Filled 2018-12-17: qty 30

## 2018-12-17 MED ORDER — ENSURE ENLIVE PO LIQD: 237.0000 mL | Freq: Two times a day (BID) | ORAL | 12 refills | Status: AC

## 2018-12-17 MED ORDER — LORAZEPAM 0.5 MG PO TABS: 0.5000 mg | ORAL_TABLET | Freq: Every day | ORAL | 0 refills | Status: AC | PRN

## 2018-12-17 NOTE — Progress Notes (Signed)
Discharge to: [facility] Anticipated discharge date: 12/17/18 Family notified: Yes, at bedside Transportation by: Family car  Report #: [phone], [facility]  [place] signing off.  Laveda Abbe LCSW [phone]

## 2018-12-17 NOTE — Care Management Important Message (Signed)
Important Message  Patient Details  Name: Karen Richard MRN: [id number] Date of Birth: 1927/09/13   Medicare Important Message Given:  Yes    Barb Merino Terrence Pizana 12/17/2018, 12:16 PM

## 2018-12-17 NOTE — Discharge Summary (Addendum)
Physician Discharge Summary  OCIA SIMEK NFA:[id number] DOB: 1927-09-17 DOA: 12/09/2018  PCP: Caren Macadam, MD  Admit date: 12/09/2018 Discharge date: 12/17/2018  Admitted From: Home  Disposition:  SNF   Recommendations for Outpatient Follow-up and new medication changes:  1. Follow up with Dr. Mannie Stabile in 7 days.  2. Liver biopsy is still pending. 3. Patient will take lactulose bid and to further titrate to 2 to 3 bowel movements per day. 4. Continue diuretic therapy with torsemide. Holding spironolactone due to hyperkalemia.   Home Health: Na   Equipment/Devices: Na    Discharge Condition: stable  CODE STATUS: DNR   Diet recommendation:  Regular.   Brief/Interim Summary: 85 year old female who presented with altered mental status.She does have significant past medical history for nonalcoholic liver cirrhosis, CLL, heart failure, dyslipidemia and neuromuscular disorder. Patient reported to have confusion and increased falls over the last 4 weeks,associated with generalized weakness and lower extremity edema. On her initial physical examination blood pressure 125/35, heart rate 102, respiratory rate 14, temperature 97.8 F, oxygen saturation 95%.She was awake and alert, heart S1-S2 present and rhythmic, her lungs were clear to auscultation bilaterally, abdomen nontender, positive lower extremity edema 4+ bilaterally lower extremities.  Sodium 130, potassium 5.0, chloride 92, bicarb 26, glucose 113, BUN 35, creatinine 1.73, AST 202, ALT 79, total bilirubin 7.0, white count 24.8, hemoglobin 12.5, hematocrit 35.7, platelets 106, urinalysis was negative for infection, 100 protein, osmolarity 647, sodium 204.  Her chest radiograph had no infiltrates.  Her EKG was sinus rhythm, left axis deviation, left bundle branch block, positive PACs.  Patient was admitted to the hospital working diagnosis of acute encephalopathy due to acute decompensation of chronic liver failure.  1.  Acute  decompensation of chronic liver failure (steatohepatitis/Nash)., calculated MELD score 27 with 19.6% estimated mortality in 3 months.  Patient was admitted to the medical ward, she was placed on remote telemetry monitoring, received aggressive diuresis with IV furosemide with improvement in her volume status.  She also was placed on lactulose for her encephalopathy.  Further work-up revealed a ferritin of 1,427, transferrin saturation 90, serum iron 206, she underwent a liver biopsy to rule out hemochromatosis.  She underwent abdominal ultrasonography, no significant ascitic fluid found.  Pending report.  Patient has been instructed to take lactulose twice daily, and further titrate to 2-3 bowel movements daily.  Her extra volume seems to be more likely related to her liver disease than heart failure, acute heart failure decompensation ruled out.  2.  Acute kidney injury chronic disease stage III with hyponatremia.  Patient received diuresis with improvement of kidney function, discharge creatinine 1.4, sodium has been stable between 129 and 131.  Patient will continue taking torsemide at discharge, hold on potassium supplements due to risk of hyperkalemia.   3.  Chronic lymphocytic leukemia.  Stage a/stage I.  Her cell count has been stable.  Follow-up with oncology clinic as an outpatient.  4.  Deconditioning.  Patient was seen by physical therapy, patient continued to be very weak, she will be discharged to skilled nursing facility.  5. Calorie protein malnutrition. (unspeified), continue nutritional supplements.   6. Chronic and stable diastolic heart failure. Patient had no exacerbation, her volume overload was given from liver failure and not from heart failure.   Discharge Diagnoses:  Principal Problem:   Volume overload Active Problems:   CLL (chronic lymphocytic leukemia) (HCC)   Cirrhosis of liver without ascites (HCC)   Acute encephalopathy   Poor tolerance for  ambulation   Fall at  home, initial encounter   Liver failure [facility])    Discharge Instructions   Allergies as of 12/17/2018      Reactions   Codeine Nausea Only   Darvocet [propoxyphene N-acetaminophen] Nausea Only      Medication List    STOP taking these medications   ciprofloxacin 500 MG tablet Commonly known as:  CIPRO   spironolactone 25 MG tablet Commonly known as:  ALDACTONE     TAKE these medications   feeding supplement (ENSURE ENLIVE) Liqd Take 237 mLs by mouth 2 (two) times daily between meals.   lactulose 10 GM/15ML solution Commonly known as:  CHRONULAC Take 15 mLs (10 g total) by mouth 2 (two) times daily for 30 days. Please titrate to 2 to 3 bowel movements per day. What changed:    how much to take  when to take this  additional instructions   LORazepam 0.5 MG tablet Commonly known as:  ATIVAN Take 1 tablet (0.5 mg total) by mouth daily as needed for anxiety.   torsemide 20 MG tablet Commonly known as:  DEMADEX Take 1 tablet (20 mg total) by mouth daily.   TYLENOL ARTHRITIS EXT RELIEF PO Take 1 tablet by mouth as needed (pain).   Vitamin D (Ergocalciferol) 1.25 MG (50000 UT) Caps capsule Commonly known as:  DRISDOL TAKE 1 CAPSULE ONCE A WEEK What changed:  See the new instructions.       Allergies  Allergen Reactions  . Codeine Nausea Only  . Darvocet [Propoxyphene N-Acetaminophen] Nausea Only    Consultations:  GI   Procedures/Studies: Dg Chest 2 View  Result Date: 12/09/2018 CLINICAL DATA:  Elevated ammonia levels, weakness, cough EXAM: CHEST - 2 VIEW COMPARISON:  11/19/2018 FINDINGS: The heart size and mediastinal contours are within normal limits. Both lungs are clear. The visualized skeletal structures are unremarkable. IMPRESSION: No active cardiopulmonary disease. Electronically Signed   By: Kathreen Devoid   On: 12/09/2018 15:15   Dg Chest 2 View  Result Date: 11/19/2018 CLINICAL DATA:  Overall weakness since yesterday. EXAM: CHEST - 2 VIEW  COMPARISON:  11/15/2018 FINDINGS: The heart size and mediastinal contours are within normal limits. Nonaneurysmal atherosclerotic aorta. Both lungs are clear. The visualized skeletal structures are unremarkable. IMPRESSION: No active cardiopulmonary disease.  Aortic atherosclerosis. Electronically Signed   By: Ashley Royalty M.D.   On: 11/19/2018 16:17   Ct Head Wo Contrast  Result Date: 11/19/2018 CLINICAL DATA:  Increased confusion.  Fall 4 days ago. EXAM: CT HEAD WITHOUT CONTRAST TECHNIQUE: Contiguous axial images were obtained from the base of the skull through the vertex without intravenous contrast. COMPARISON:  11/15/2018 FINDINGS: Brain: Mild global atrophy is appropriate to age. Mild chronic ischemic changes in the periventricular white matter. There is no mass effect, midline shift, or acute hemorrhage. Vascular: No hyperdense vessel or unexpected calcification. Skull: The cranium is intact. Sinuses/Orbits: Orbits are within normal limits. Visualized paranasal sinuses and mastoid air cells are clear. Other: Noncontributory. IMPRESSION: No acute intracranial pathology. Electronically Signed   By: Marybelle Killings M.D.   On: 11/19/2018 19:53   Ir Venogram Hepatic W Hemodynamic Evaluation  Result Date: 12/12/2018 INDICATION: 85 year old with decompensated cirrhosis. Liver biopsy requested to evaluate for autoimmune disease versus Nash versus iron overload. Patient has a slightly elevated INR and has extensive perihepatic ascites. Plan for transjugular liver biopsy with pressures. EXAM: TRANSJUGULAR LIVER BIOPSY WITH PRESSURES HEPATIC VENOGRAPHY ULTRASOUND GUIDANCE FOR VASCULAR ACCESS MEDICATIONS: None. ANESTHESIA/SEDATION: Versed 0.5 mg  The patient's level of consciousness and vital signs were monitored continuously by radiology nursing throughout the procedure under my direct supervision. FLUOROSCOPY TIME:  Fluoroscopy Time: 16 minutes, 24 seconds, 51 mGy COMPLICATIONS: None immediate. PROCEDURE: Informed  written consent was obtained from the patient after a thorough discussion of the procedural risks, benefits and alternatives. All questions were addressed. Maximal Sterile Barrier Technique was utilized including caps, mask, sterile gowns, sterile gloves, sterile drape, hand hygiene and skin antiseptic. A timeout was performed prior to the initiation of the procedure. Right neck was prepped and draped in sterile fashion. Ultrasound confirmed a patent right internal jugular vein. Ultrasound image was saved for documentation. Neck was anesthetized with 1% lidocaine. 21 gauge needle directed into the right internal jugular vein with ultrasound guidance. Micropuncture dilator set was placed. Bentson wire was advanced into the SVC and IVC. The tract was dilated to accommodate a 10 [place] sheath. MPA catheter was used to cannulate a right hepatic vein and hepatic venography was performed. Series of wedged and free hepatic vein pressures were obtained. The transjugular biopsy kit was advanced into the 10 [place] vascular sheath over a stiff Amplatz wire. It was very difficult to advance the transjugular biopsy kit into the hepatic venous system due to the small size of the liver. Eventually, the 10 [place] vascular sheath and transjugular biopsy kit were advanced into the right hepatic vein over a stiff Amplatz wire. Four transjugular core biopsies were obtained. Specimens placed in formalin. The transjugular biopsy kit was removed. The vascular sheath was removed with manual compression. Bandage placed over the puncture site. FINDINGS: Wedged hepatic venography demonstrated parenchymal blushing with possible reflux into the portal venous system. Free and wedged hepatic pressures were obtained. Unable to get good waveforms during the hepatic vein pressure measurements. Hepatic venous pressure gradient appeared to be markedly elevated measuring roughly 20 but not clear if this is an accurate measurement. Four adequate core  specimens were obtained. IMPRESSION: Transjugular liver biopsy.  Four core biopsies were obtained. Hepatic venous pressure gradient appears to be markedly elevated and suggestive for portal hypertension but not clear if these measurement are accurate. Electronically Signed   By: Markus Daft M.D.   On: 12/12/2018 18:20   Ir Transcatheter Bx  Result Date: 12/12/2018 INDICATION: 85 year old with decompensated cirrhosis. Liver biopsy requested to evaluate for autoimmune disease versus Nash versus iron overload. Patient has a slightly elevated INR and has extensive perihepatic ascites. Plan for transjugular liver biopsy with pressures. EXAM: TRANSJUGULAR LIVER BIOPSY WITH PRESSURES HEPATIC VENOGRAPHY ULTRASOUND GUIDANCE FOR VASCULAR ACCESS MEDICATIONS: None. ANESTHESIA/SEDATION: Versed 0.5 mg The patient's level of consciousness and vital signs were monitored continuously by radiology nursing throughout the procedure under my direct supervision. FLUOROSCOPY TIME:  Fluoroscopy Time: 16 minutes, 24 seconds, 51 mGy COMPLICATIONS: None immediate. PROCEDURE: Informed written consent was obtained from the patient after a thorough discussion of the procedural risks, benefits and alternatives. All questions were addressed. Maximal Sterile Barrier Technique was utilized including caps, mask, sterile gowns, sterile gloves, sterile drape, hand hygiene and skin antiseptic. A timeout was performed prior to the initiation of the procedure. Right neck was prepped and draped in sterile fashion. Ultrasound confirmed a patent right internal jugular vein. Ultrasound image was saved for documentation. Neck was anesthetized with 1% lidocaine. 21 gauge needle directed into the right internal jugular vein with ultrasound guidance. Micropuncture dilator set was placed. Bentson wire was advanced into the SVC and IVC. The tract was dilated to accommodate a 10 [place]  sheath. MPA catheter was used to cannulate a right hepatic vein and hepatic  venography was performed. Series of wedged and free hepatic vein pressures were obtained. The transjugular biopsy kit was advanced into the 10 [place] vascular sheath over a stiff Amplatz wire. It was very difficult to advance the transjugular biopsy kit into the hepatic venous system due to the small size of the liver. Eventually, the 10 [place] vascular sheath and transjugular biopsy kit were advanced into the right hepatic vein over a stiff Amplatz wire. Four transjugular core biopsies were obtained. Specimens placed in formalin. The transjugular biopsy kit was removed. The vascular sheath was removed with manual compression. Bandage placed over the puncture site. FINDINGS: Wedged hepatic venography demonstrated parenchymal blushing with possible reflux into the portal venous system. Free and wedged hepatic pressures were obtained. Unable to get good waveforms during the hepatic vein pressure measurements. Hepatic venous pressure gradient appeared to be markedly elevated measuring roughly 20 but not clear if this is an accurate measurement. Four adequate core specimens were obtained. IMPRESSION: Transjugular liver biopsy.  Four core biopsies were obtained. Hepatic venous pressure gradient appears to be markedly elevated and suggestive for portal hypertension but not clear if these measurement are accurate. Electronically Signed   By: Markus Daft M.D.   On: 12/12/2018 18:20   US Renal  Result Date: 12/10/2018 CLINICAL DATA:  Acute kidney injury EXAM: RENAL / URINARY TRACT ULTRASOUND COMPLETE COMPARISON:  CT 02/08/2016 FINDINGS: Right Kidney: Renal measurements: 8.5 x 5.0 x 4.0 cm = volume: 89 mL . Echogenicity within normal limits. No mass or hydronephrosis visualized. Left Kidney: Renal measurements: 10.2 x 5.1 x 4.7 cm = volume: 125 mL. Echogenicity within normal limits. No mass or hydronephrosis visualized. Bladder: Appears normal for degree of bladder distention. IMPRESSION: No acute findings.  No  hydronephrosis. Electronically Signed   By: Rolm Baptise M.D.   On: 12/10/2018 02:06   US Abdomen Limited  Result Date: 12/14/2018 CLINICAL DATA:  Abdominal distention. Please perform ascites search ultrasound and ultrasound-guided paracentesis as indicated. EXAM: LIMITED ABDOMEN ULTRASOUND FOR ASCITES TECHNIQUE: Limited ultrasound survey for ascites was performed in all four abdominal quadrants. COMPARISON:  Ascites search ultrasound-11/09/2018 FINDINGS: Sonographic evaluation of the abdomen demonstrates a trace amount of perihepatic fluid, too small to allow for safe ultrasound-guided paracentesis. No paracentesis attempted. IMPRESSION: Trace amount of perihepatic ascites, too small to allow for safe ultrasound-guided paracentesis. No paracentesis attempted Electronically Signed   By: Sandi Mariscal M.D.   On: 12/14/2018 12:28   Ir US Guide Vasc Access Right  Result Date: 12/12/2018 INDICATION: 85 year old with decompensated cirrhosis. Liver biopsy requested to evaluate for autoimmune disease versus Nash versus iron overload. Patient has a slightly elevated INR and has extensive perihepatic ascites. Plan for transjugular liver biopsy with pressures. EXAM: TRANSJUGULAR LIVER BIOPSY WITH PRESSURES HEPATIC VENOGRAPHY ULTRASOUND GUIDANCE FOR VASCULAR ACCESS MEDICATIONS: None. ANESTHESIA/SEDATION: Versed 0.5 mg The patient's level of consciousness and vital signs were monitored continuously by radiology nursing throughout the procedure under my direct supervision. FLUOROSCOPY TIME:  Fluoroscopy Time: 16 minutes, 24 seconds, 51 mGy COMPLICATIONS: None immediate. PROCEDURE: Informed written consent was obtained from the patient after a thorough discussion of the procedural risks, benefits and alternatives. All questions were addressed. Maximal Sterile Barrier Technique was utilized including caps, mask, sterile gowns, sterile gloves, sterile drape, hand hygiene and skin antiseptic. A timeout was performed prior to  the initiation of the procedure. Right neck was prepped and draped in sterile fashion. Ultrasound confirmed  a patent right internal jugular vein. Ultrasound image was saved for documentation. Neck was anesthetized with 1% lidocaine. 21 gauge needle directed into the right internal jugular vein with ultrasound guidance. Micropuncture dilator set was placed. Bentson wire was advanced into the SVC and IVC. The tract was dilated to accommodate a 10 [place] sheath. MPA catheter was used to cannulate a right hepatic vein and hepatic venography was performed. Series of wedged and free hepatic vein pressures were obtained. The transjugular biopsy kit was advanced into the 10 [place] vascular sheath over a stiff Amplatz wire. It was very difficult to advance the transjugular biopsy kit into the hepatic venous system due to the small size of the liver. Eventually, the 10 [place] vascular sheath and transjugular biopsy kit were advanced into the right hepatic vein over a stiff Amplatz wire. Four transjugular core biopsies were obtained. Specimens placed in formalin. The transjugular biopsy kit was removed. The vascular sheath was removed with manual compression. Bandage placed over the puncture site. FINDINGS: Wedged hepatic venography demonstrated parenchymal blushing with possible reflux into the portal venous system. Free and wedged hepatic pressures were obtained. Unable to get good waveforms during the hepatic vein pressure measurements. Hepatic venous pressure gradient appeared to be markedly elevated measuring roughly 20 but not clear if this is an accurate measurement. Four adequate core specimens were obtained. IMPRESSION: Transjugular liver biopsy.  Four core biopsies were obtained. Hepatic venous pressure gradient appears to be markedly elevated and suggestive for portal hypertension but not clear if these measurement are accurate. Electronically Signed   By: Markus Daft M.D.   On: 12/12/2018 18:20        Subjective: Patient is feeling better, dyspnea and lower extremity edema have improved, continue to have poor appetite and generalized weakness.   Discharge Exam: Vitals:   12/17/18 0309 12/17/18 0805  BP: (!) 113/40 (!) 118/50  Pulse: 82 93  Resp: 20 18  Temp: 98.2 F (36.8 C)   SpO2: 97% 96%   Vitals:   12/16/18 2004 12/16/18 2346 12/17/18 0309 12/17/18 0805  BP: (!) 123/49 (!) 126/41 (!) 113/40 (!) 118/50  Pulse: 85 81 82 93  Resp: 18 18 20 18   Temp: 97.9 F (36.6 C) 98 F (36.7 C) 98.2 F (36.8 C)   TempSrc: Oral Oral Oral   SpO2: 96% 96% 97% 96%  Weight:   73.9 kg   Height:        General: Not in pain or dyspnea.  Neurology: Awake and alert, non focal  E ENT: mild pallor, no icterus, oral mucosa moist Cardiovascular: No JVD. S1-S2 present, rhythmic, no gallops, rubs, or murmurs. ++ non pitting lower extremity edema. Pulmonary:  Positive breath sounds bilaterally, adequate air movement, no wheezing, rhonchi or rales. Gastrointestinal. Abdomen protuberant no organomegaly, non tender, no rebound or guarding Skin. No rashes Musculoskeletal: no joint deformities   The results of significant diagnostics from this hospitalization (including imaging, microbiology, ancillary and laboratory) are listed below for reference.     Microbiology: No results found for this or any previous visit (from the past 240 hour(s)).   Labs: BNP (last 3 results) Recent Labs    11/04/18 1520 11/19/18 1503 12/09/18 1325  BNP 97.5 52.0 875.6*   Basic Metabolic Panel: Recent Labs  Lab 12/13/18 1038 12/14/18 0643 12/15/18 0748 12/16/18 1424 12/17/18 0533  NA 131* 131* 129* 127* 129*  K 4.4 5.3* 4.7 4.7 4.5  CL 91* 95* 91* 91* 91*  CO2 27 28 26  26  29  GLUCOSE 153* 103* 83 199* 108*  BUN 33* 32* 37* 34* 29*  CREATININE 1.31* 1.22* 1.40* 1.39* 1.24*  CALCIUM 9.1 8.8* 8.8* 8.5* 8.5*   Liver Function Tests: No results for input(s): AST, ALT, ALKPHOS, BILITOT, PROT,  ALBUMIN in the last 168 hours. No results for input(s): LIPASE, AMYLASE in the last 168 hours. No results for input(s): AMMONIA in the last 168 hours. CBC: Recent Labs  Lab 12/12/18 0532  WBC 22.6*  NEUTROABS 19.0*  HGB 11.3*  HCT 31.7*  MCV 95.5  PLT 92*   Cardiac Enzymes: No results for input(s): CKTOTAL, CKMB, CKMBINDEX, TROPONINI in the last 168 hours. BNP: Invalid input(s): POCBNP CBG: No results for input(s): GLUCAP in the last 168 hours. D-Dimer No results for input(s): DDIMER in the last 72 hours. Hgb A1c No results for input(s): HGBA1C in the last 72 hours. Lipid Profile No results for input(s): CHOL, HDL, LDLCALC, TRIG, CHOLHDL, LDLDIRECT in the last 72 hours. Thyroid function studies No results for input(s): TSH, T4TOTAL, T3FREE, THYROIDAB in the last 72 hours.  Invalid input(s): FREET3 Anemia work up No results for input(s): VITAMINB12, FOLATE, FERRITIN, TIBC, IRON, RETICCTPCT in the last 72 hours. Urinalysis    Component Value Date/Time   COLORURINE YELLOW 12/09/2018 1731   APPEARANCEUR HAZY (A) 12/09/2018 1731   LABSPEC 1.017 12/09/2018 1731   PHURINE 6.0 12/09/2018 1731   GLUCOSEU 50 (A) 12/09/2018 1731   HGBUR SMALL (A) 12/09/2018 1731   HGBUR negative 02/01/2010 1012   BILIRUBINUR NEGATIVE 12/09/2018 1731   BILIRUBINUR neg 01/11/2011 1449   KETONESUR NEGATIVE 12/09/2018 1731   PROTEINUR 100 (A) 12/09/2018 1731   UROBILINOGEN 0.2 01/11/2011 1449   UROBILINOGEN 0.2 02/01/2010 1012   NITRITE NEGATIVE 12/09/2018 1731   LEUKOCYTESUR NEGATIVE 12/09/2018 1731   Sepsis Labs Invalid input(s): PROCALCITONIN,  WBC,  LACTICIDVEN Microbiology No results found for this or any previous visit (from the past 240 hour(s)).   Time coordinating discharge: 45 minutes  SIGNED:   Tawni Millers, MD  [facility] 12/17/2018, 8:38 AM

## 2018-12-17 NOTE — Progress Notes (Signed)
Report given to [facility]. Patient leaving unit via wheelchair. All information and education provided to patient and patient's husband to be given to facility. Pt leaving unit via wheelchair.

## 2018-12-18 ENCOUNTER — Encounter (HOSPITAL_COMMUNITY): Payer: Medicare Other | Admitting: Internal Medicine

## 2018-12-20 LAB — HEMOCHROMATOSIS DNA-PCR(C282Y,H63D)

## 2018-12-23 NOTE — Progress Notes (Signed)
I called Mr. Karen Richard to inform him about the liver biopsy results we have being waiting for during [name] hospitalization. He told me that the GI team already communicated the results to him. I answer all his questions.

## 2019-01-29 DEATH — deceased

## 2019-02-04 ENCOUNTER — Inpatient Hospital Stay: Admitting: Hematology & Oncology

## 2019-02-04 ENCOUNTER — Inpatient Hospital Stay

## 2019-11-08 IMAGING — US US ABDOMEN LIMITED
1 series · 14 of 25 positions shown · non-contrast
Comparison: Abdominal ultrasound 06/06/2016. CT of the abdomen
pelvis 02/08/2016.

CLINICAL DATA: Cirrhosis of the liver.  Abdominal distension.

EXAM:
ULTRASOUND ABDOMEN LIMITED RIGHT UPPER QUADRANT

[Series 1: us abdomen limited · 14 of 27 slices shown]
[im 1/27]
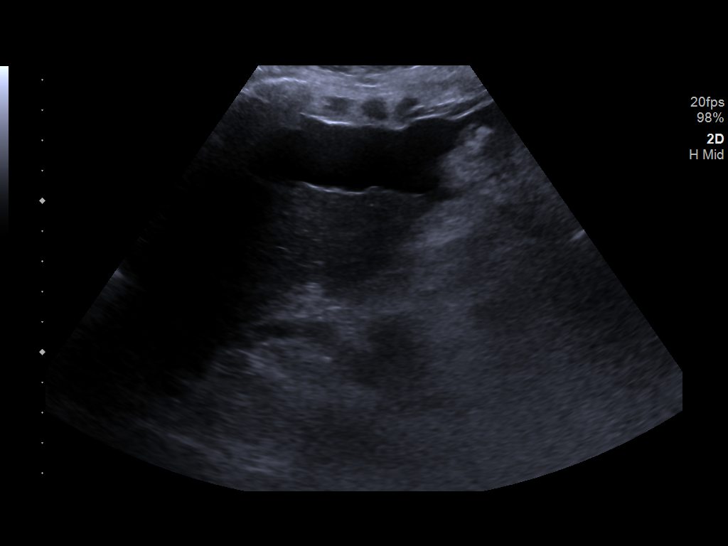
[im 3/27]
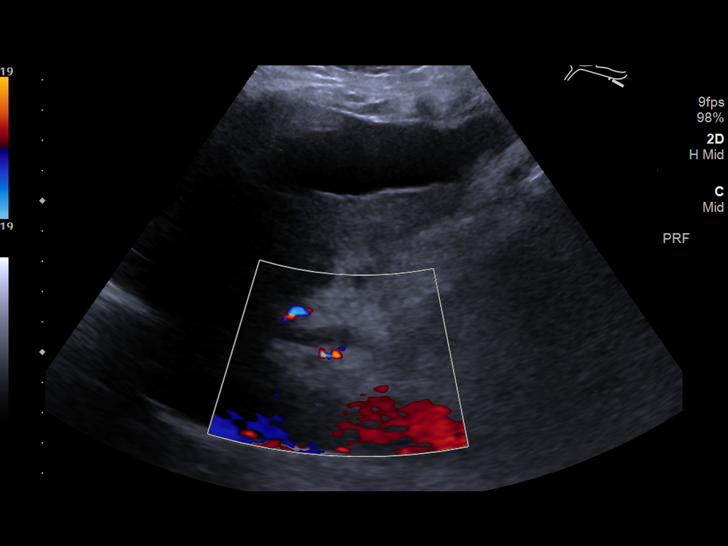
[im 5/27]
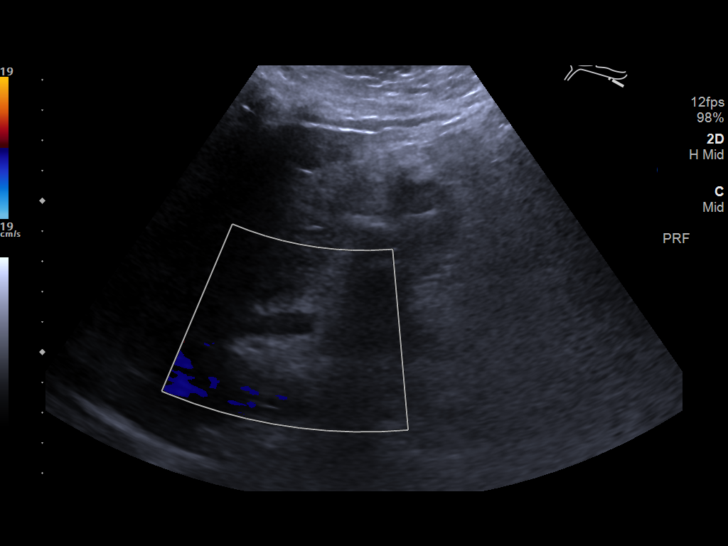
[im 7/27]
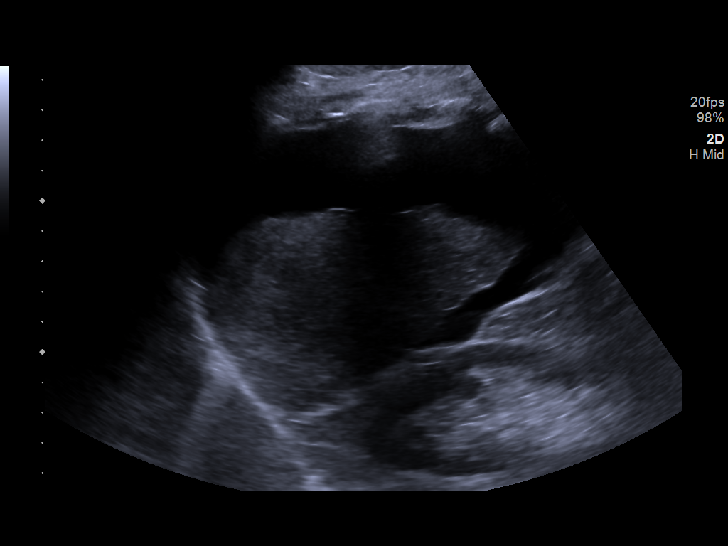
[im 9/27]
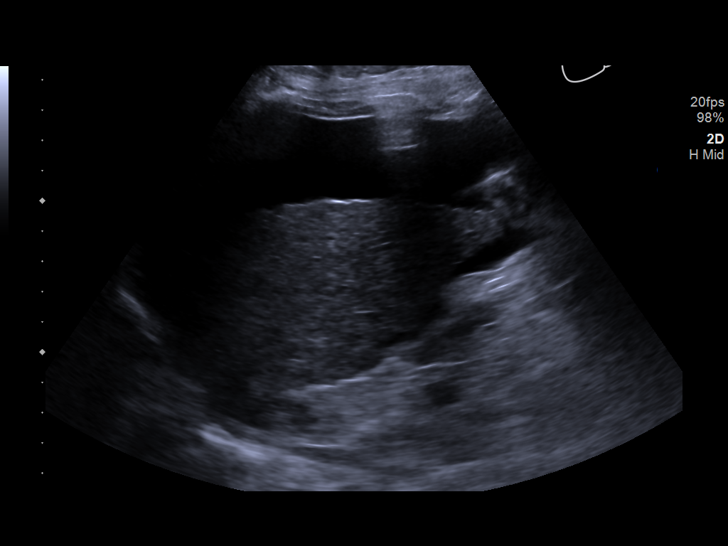
[im 10/27]
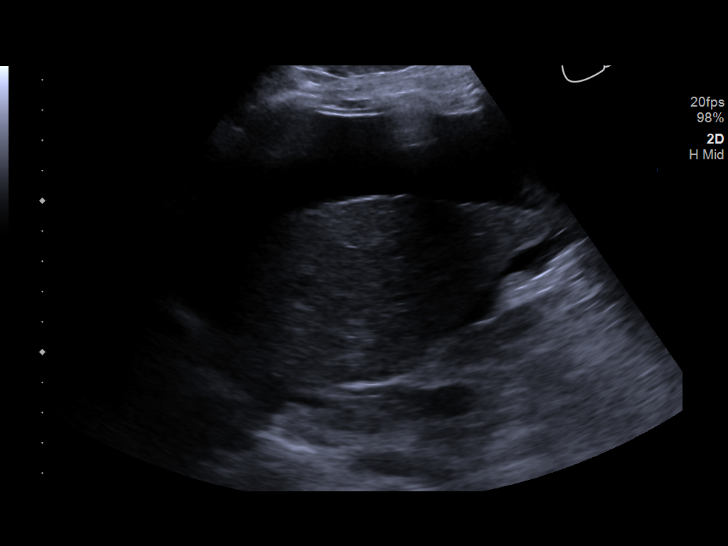
[im 12/27]
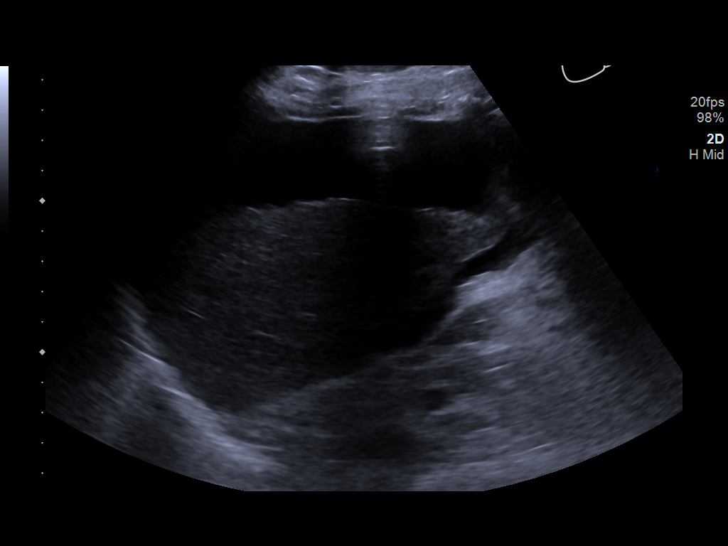
[im 15/27]
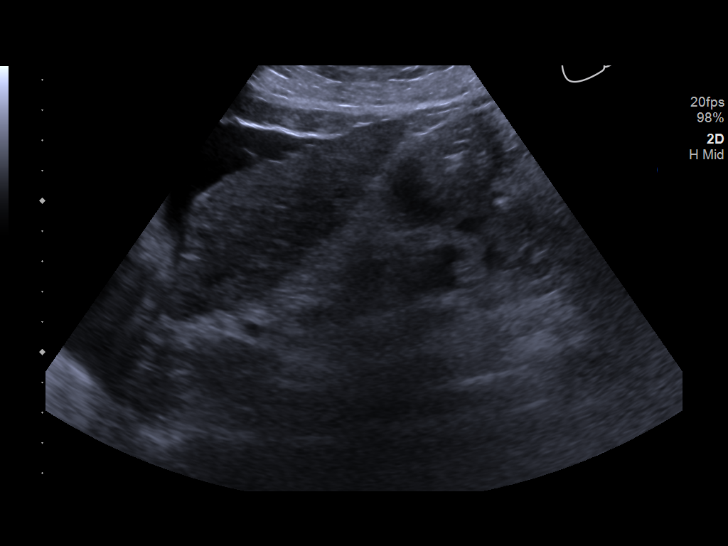
[im 17/27]
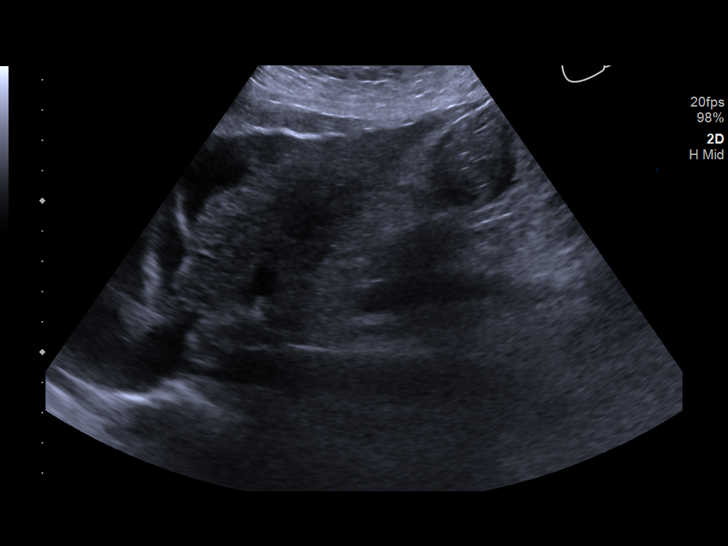
[im 18/27]
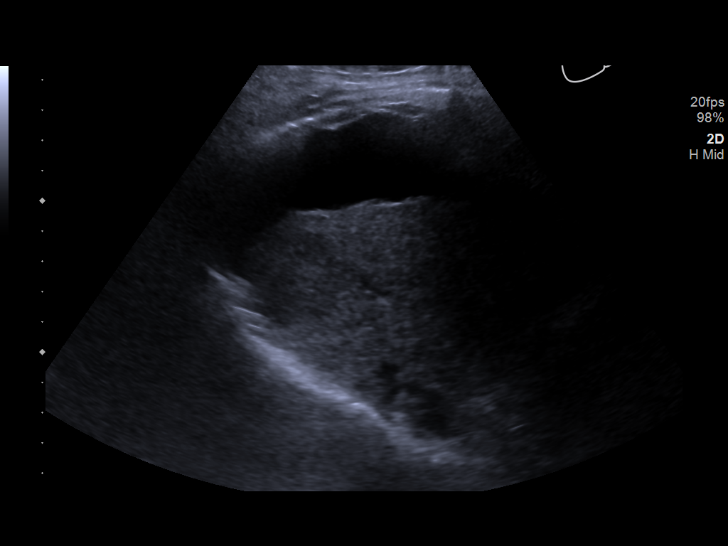
[im 20/27]
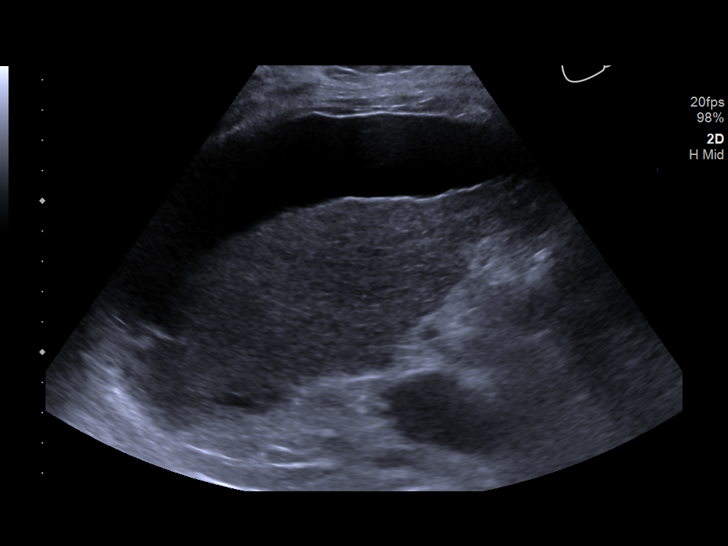
[im 22/27]
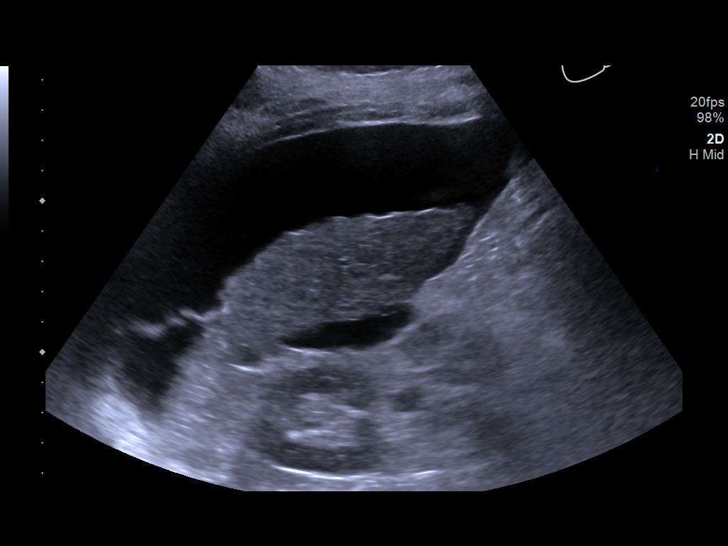
[im 24/27]
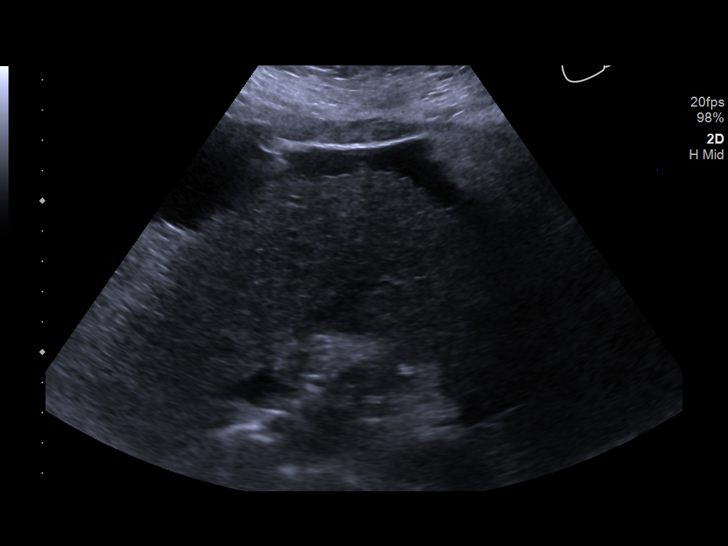
[im 27/27]
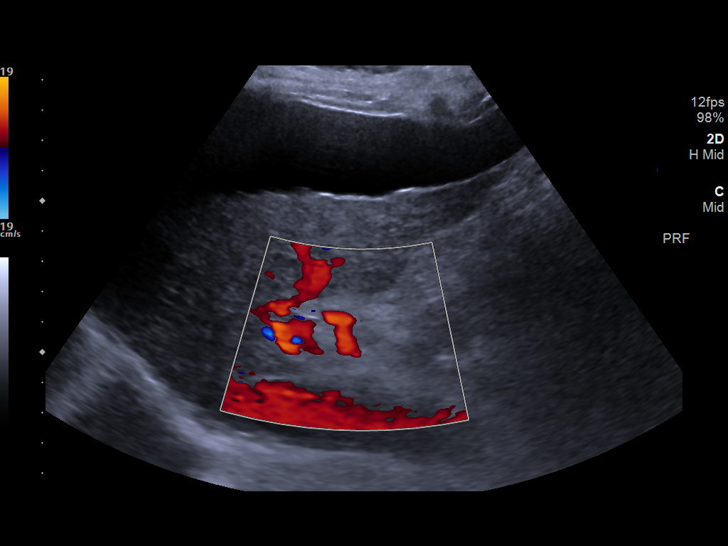

[14 of 25 positions shown; findings below may reference images not displayed]

FINDINGS: Gallbladder:

Cholecystectomy.

Common bile duct:

Diameter: 7.4 mm, within normal limits for age.

Liver:

The liver is shrunken with a nodular border. There is diffuse
increased echotexture. No discrete lesions are present. Portal vein
is patent on color Doppler imaging with normal direction of blood
flow towards the liver.

Moderate abdominal ascites is evident about the liver.
IMPRESSION: 1. Progressive changes of hepatic cirrhosis.
2. No discrete hepatic lesion.
3. Moderate abdominal ascites.

## 2019-12-11 IMAGING — XA IR TRANSCATHETER BIOPSY
1 series · 13 of 23 positions shown · IV contrast (IODINE)
Comparison: none

INDICATION: 89-year-old with decompensated cirrhosis. Liver biopsy requested to
evaluate for autoimmune disease versus Nash versus iron overload.
Patient has a slightly elevated INR and has extensive perihepatic
ascites. Plan for transjugular liver biopsy with pressures.

[Series 300: ir transcatheter bx · 13 of 23 slices shown]
[im 1/23]
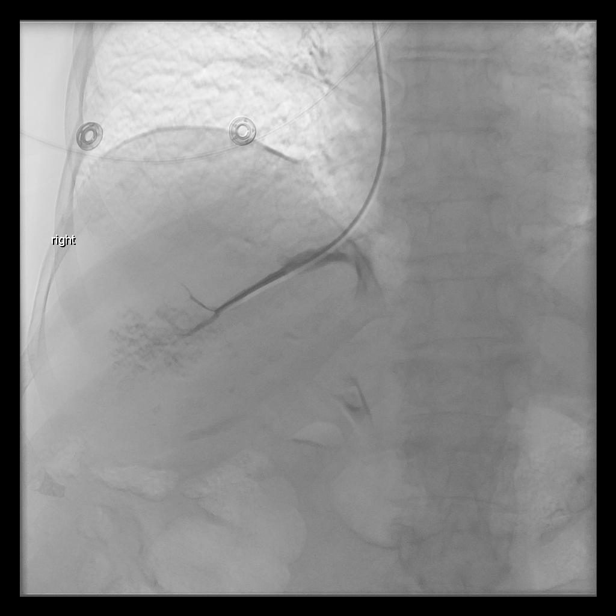
[im 3/23]
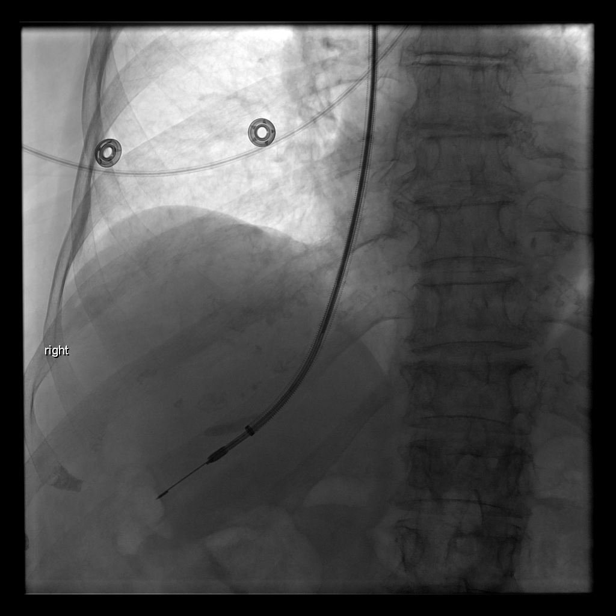
[im 5/23]
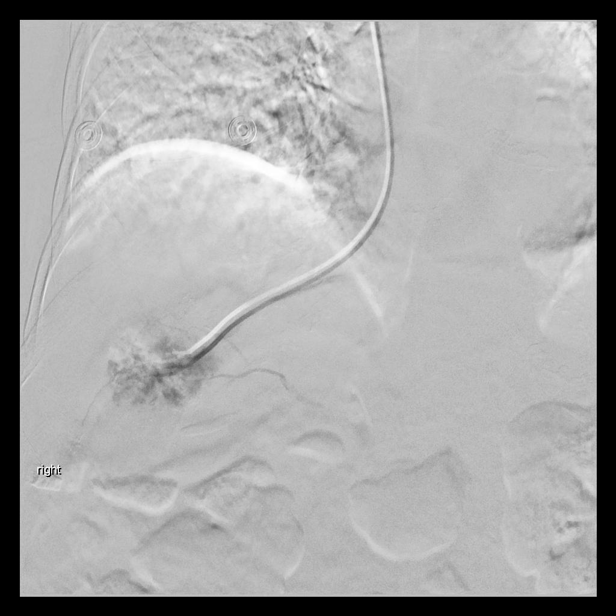
[im 7/23]
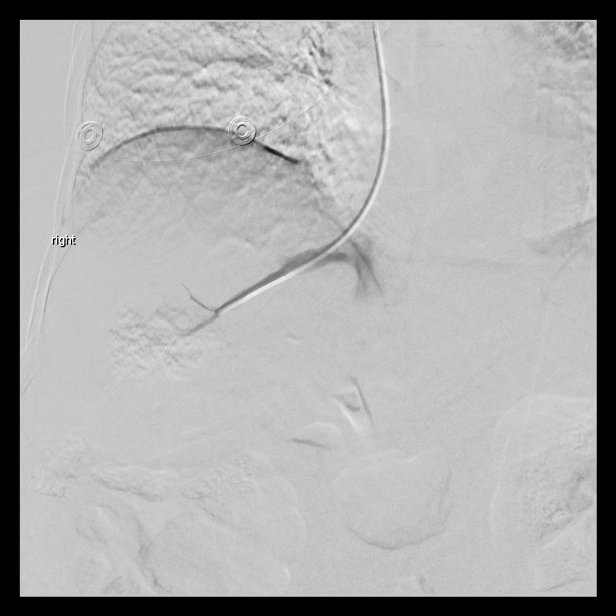
[im 8/23]
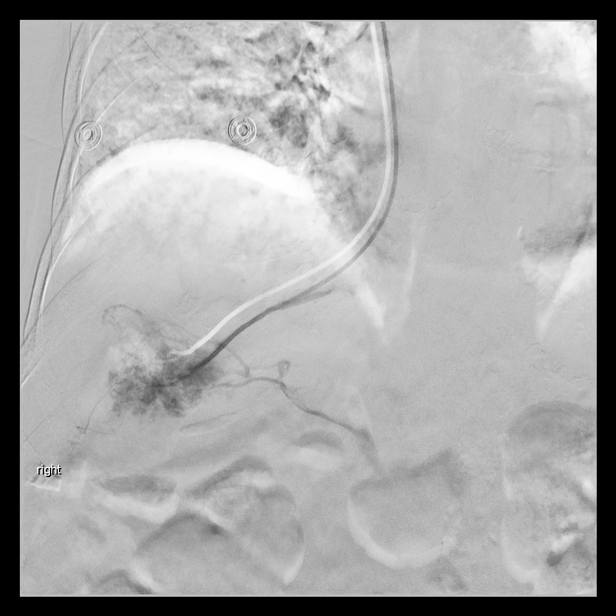
[im 10/23]
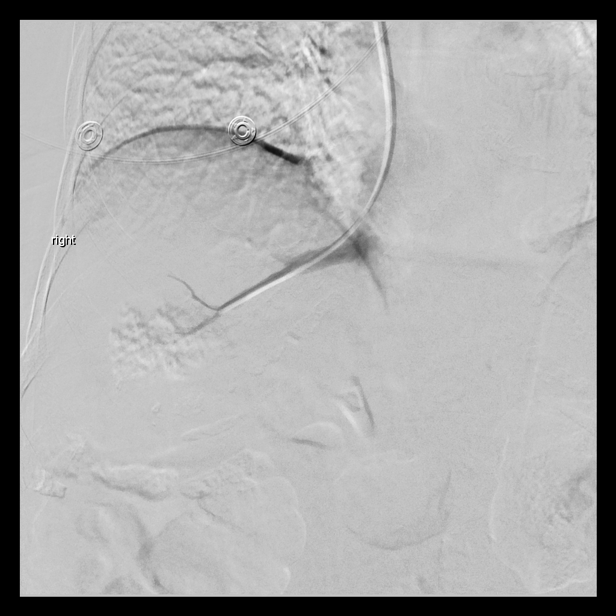
[im 12/23]
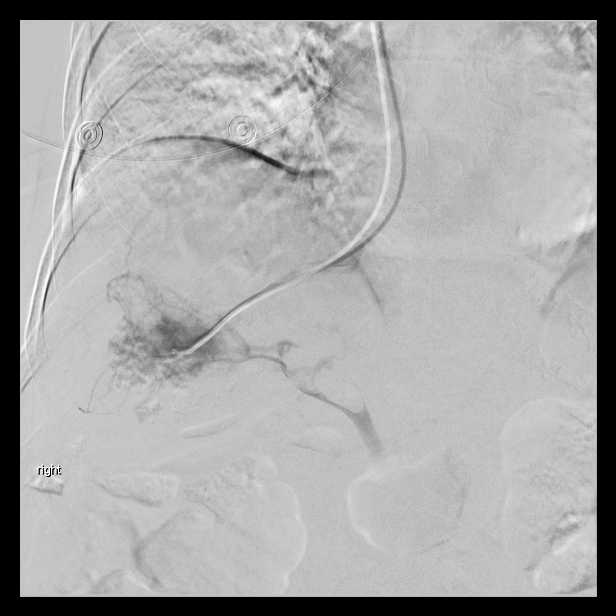
[im 14/23]
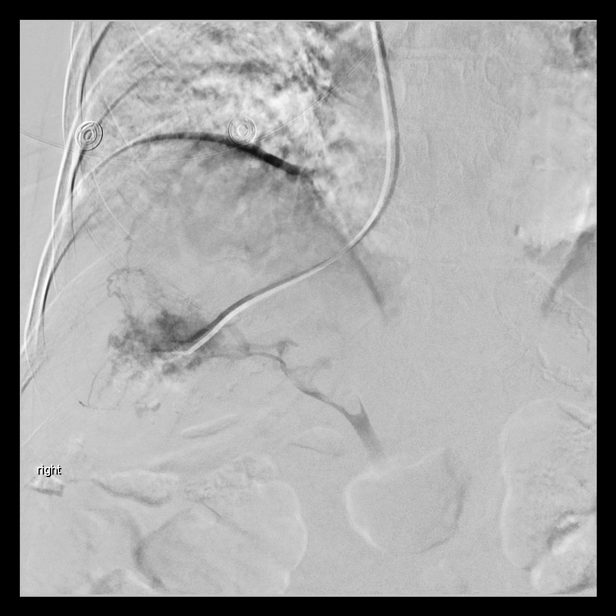
[im 16/23]
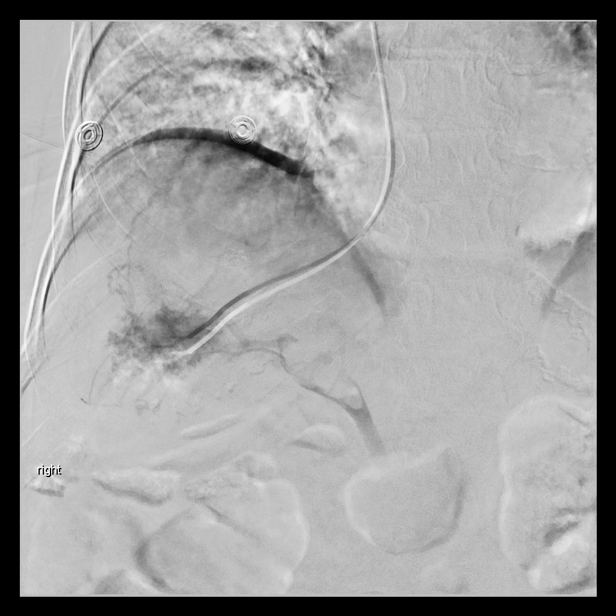
[im 17/23]
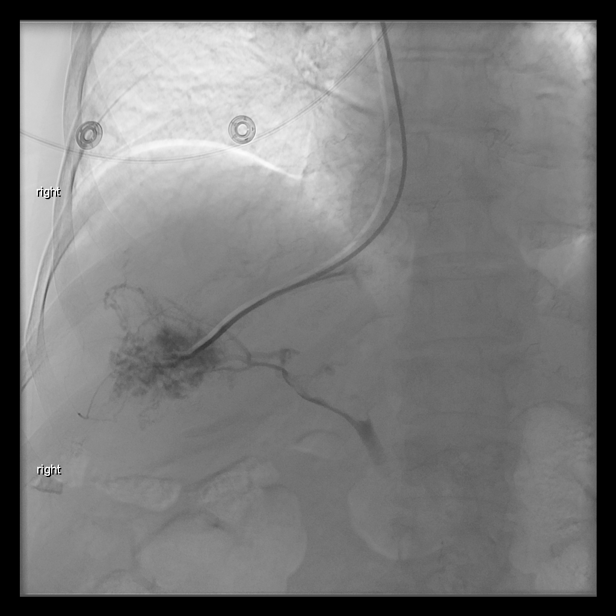
[im 19/23]
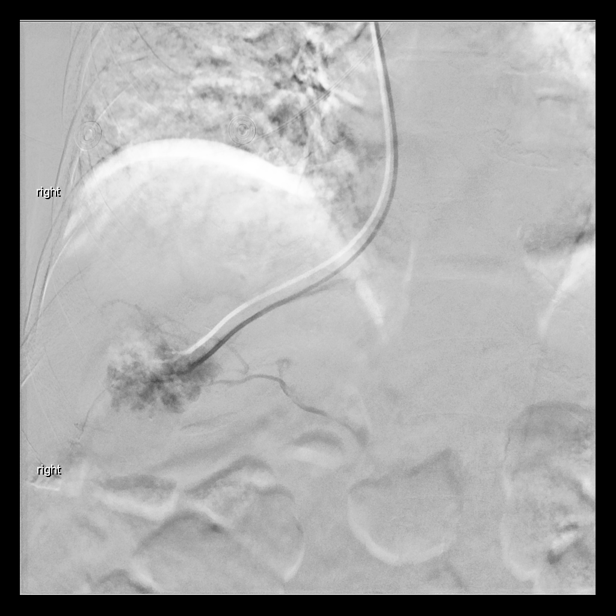
[im 21/23]
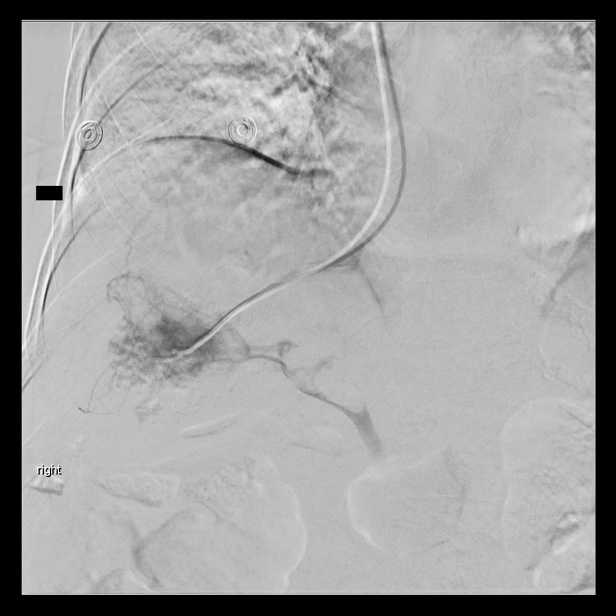
[im 23/23]
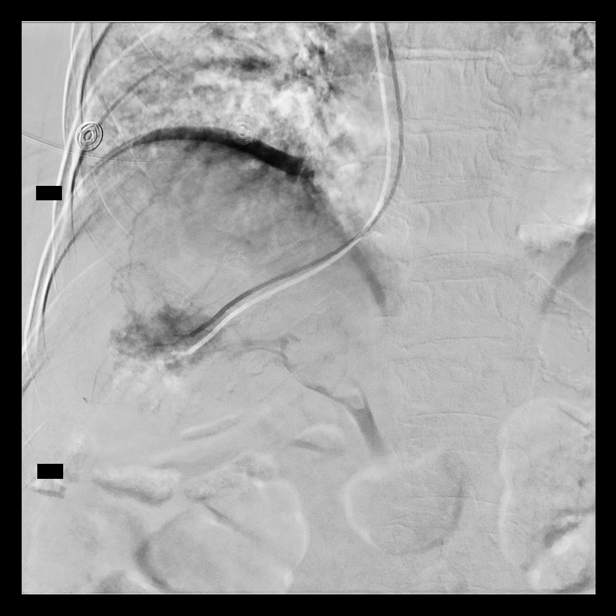

[13 of 23 positions shown; findings below may reference images not displayed]

EXAM:
TRANSJUGULAR LIVER BIOPSY WITH PRESSURES

HEPATIC VENOGRAPHY

ULTRASOUND GUIDANCE FOR VASCULAR ACCESS

MEDICATIONS:
None.

ANESTHESIA/SEDATION:
Versed 0.5 mg

The patient's level of consciousness and vital signs were monitored
continuously by radiology nursing throughout the procedure under my
direct supervision.

FLUOROSCOPY TIME:  Fluoroscopy Time: 16 minutes, 24 seconds, 51 mGy

COMPLICATIONS:
None immediate.

PROCEDURE:
Informed written consent was obtained from the patient after a
thorough discussion of the procedural risks, benefits and
alternatives. All questions were addressed. Maximal Sterile Barrier
Technique was utilized including caps, mask, sterile gowns, sterile
gloves, sterile drape, hand hygiene and skin antiseptic. A timeout
was performed prior to the initiation of the procedure.

Right neck was prepped and draped in sterile fashion. Ultrasound
confirmed a patent right internal jugular vein. Ultrasound image was
saved for documentation. Neck was anesthetized with 1% lidocaine. 21
gauge needle directed into the right internal jugular vein with
ultrasound guidance. Micropuncture dilator set was placed. Bentson
wire was advanced into the SVC and IVC. The tract was dilated to
accommodate a 10 French sheath. MPA catheter was used to cannulate a
right hepatic vein and hepatic venography was performed. Series of
wedged and free hepatic vein pressures were obtained. The
transjugular biopsy kit was advanced into the 10 French vascular
sheath over a stiff Amplatz wire. It was very difficult to advance
the transjugular biopsy kit into the hepatic venous system due to
the small size of the liver. Eventually, the 10 French vascular
sheath and transjugular biopsy kit were advanced into the right
hepatic vein over a stiff Amplatz wire. Four transjugular core
biopsies were obtained. Specimens placed in formalin. The
transjugular biopsy kit was removed. The vascular sheath was removed
with manual compression. Bandage placed over the puncture site.
FINDINGS: Wedged hepatic venography demonstrated parenchymal blushing with
possible reflux into the portal venous system. Free and wedged
hepatic pressures were obtained. Unable to get good waveforms during
the hepatic vein pressure measurements. Hepatic venous pressure
gradient appeared to be markedly elevated measuring roughly 20 but
not clear if this is an accurate measurement. Four adequate core
specimens were obtained.
IMPRESSION: Transjugular liver biopsy.  Four core biopsies were obtained.

Hepatic venous pressure gradient appears to be markedly elevated and
suggestive for portal hypertension but not clear if these
measurement are accurate.

## 2021-01-10 IMAGING — US US ABDOMEN LIMITED
1 series · 4 of 4 positions shown · non-contrast
Comparison: Abdominal ultrasound 11/09/2018

CLINICAL DATA: 89-year-old female with ascites. Evaluate for
paracentesis.

EXAM:
LIMITED ABDOMEN ULTRASOUND FOR ASCITES
TECHNIQUE: Limited ultrasound survey for ascites was performed in all four
abdominal quadrants.

[Series 1: us abdomen limited · 0.26mm/px · 4 of 4 slices shown]
[im 1/4]
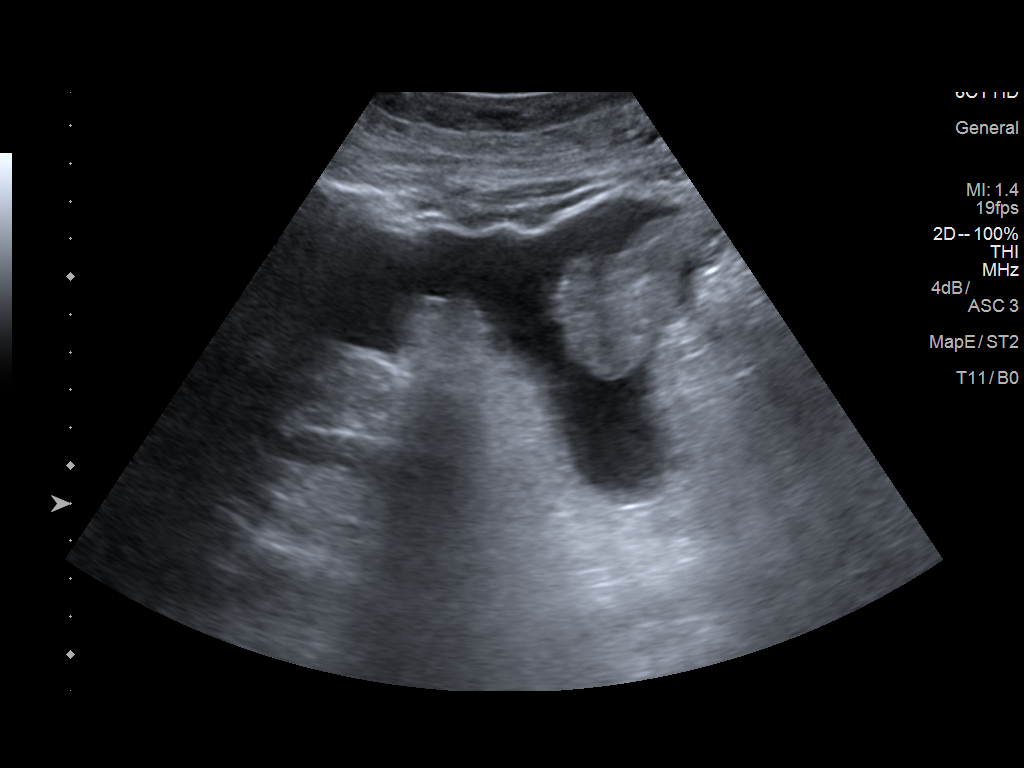
[im 2/4]
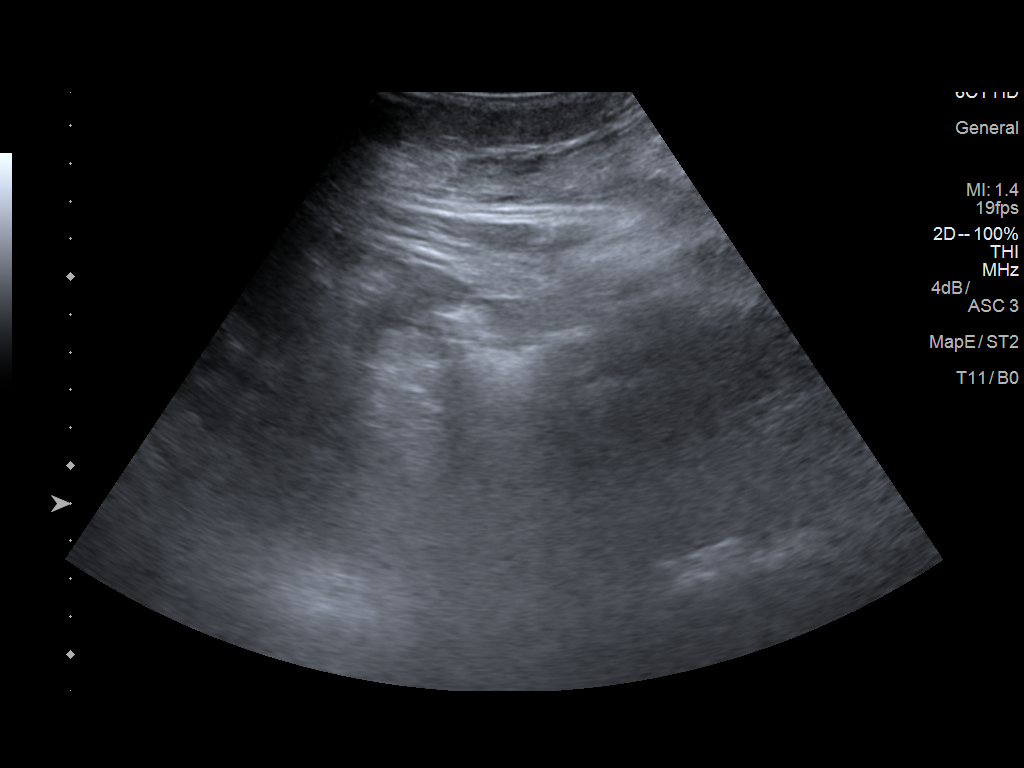
[im 3/4]
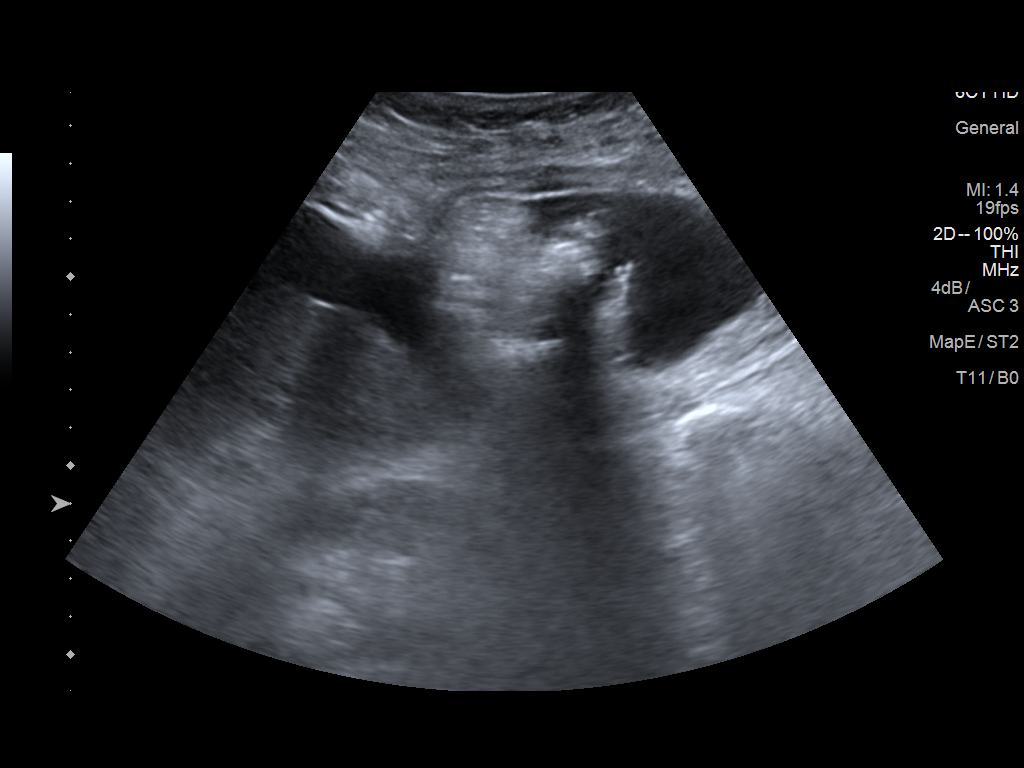
[im 4/4]
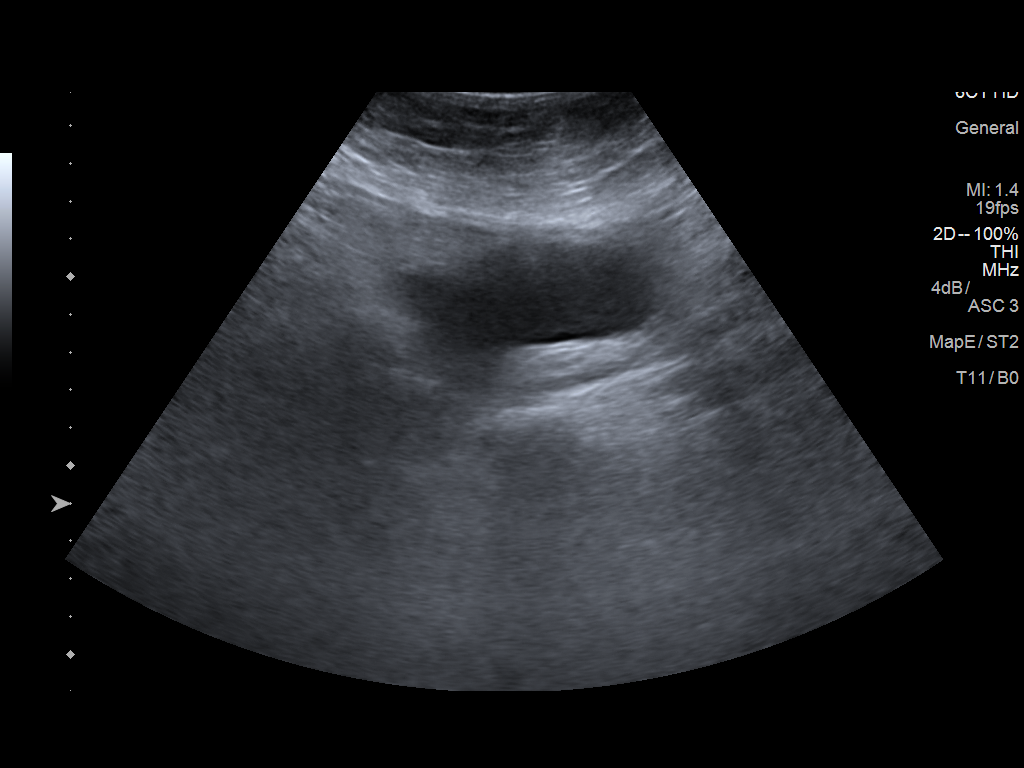

[4 of 4 positions shown; findings below may reference images not displayed]

FINDINGS: Sonographic interrogation of the 4 quadrants of the abdomen
demonstrates mild ascites. There is insufficient ascites for
paracentesis at this time.
IMPRESSION: Insufficient fluid for paracentesis.

## 2021-02-14 IMAGING — US US ABDOMEN LIMITED
1 series · 4 of 4 positions shown · non-contrast
Comparison: Ascites search ultrasound-11/09/2018

CLINICAL DATA: Abdominal distention. Please perform ascites search
ultrasound and ultrasound-guided paracentesis as indicated.

EXAM:
LIMITED ABDOMEN ULTRASOUND FOR ASCITES
TECHNIQUE: Limited ultrasound survey for ascites was performed in all four
abdominal quadrants.

[Series 1: us abdomen limited · 0.22mm/px · 4 of 4 slices shown]
[im 1/4]
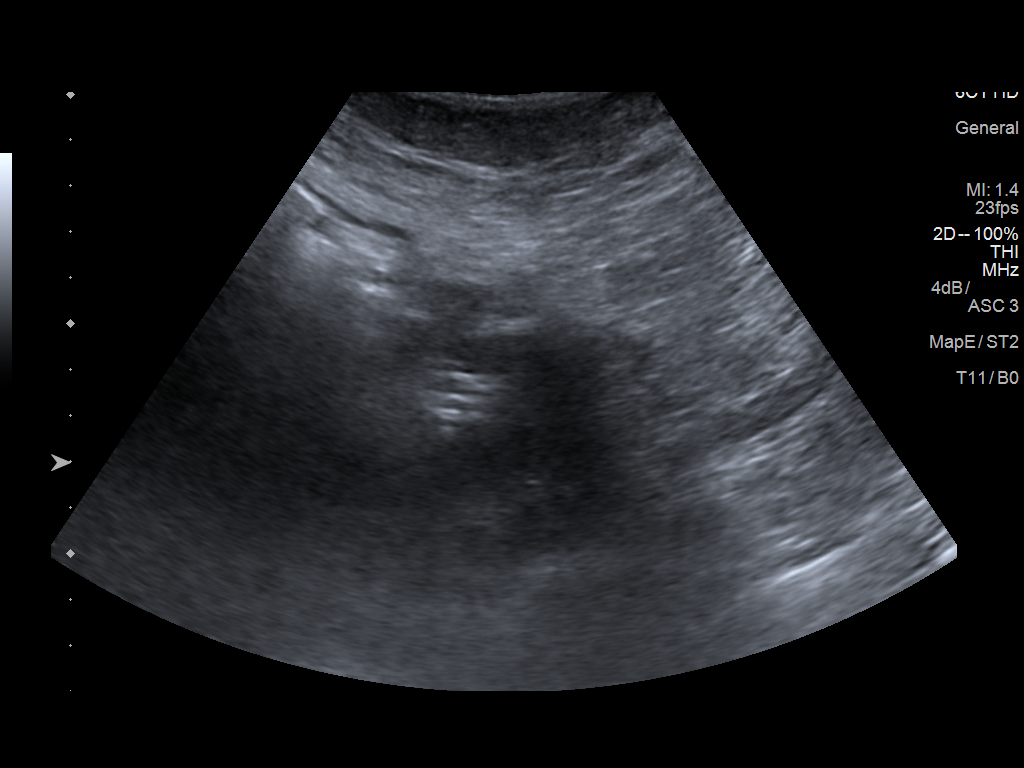
[im 2/4]
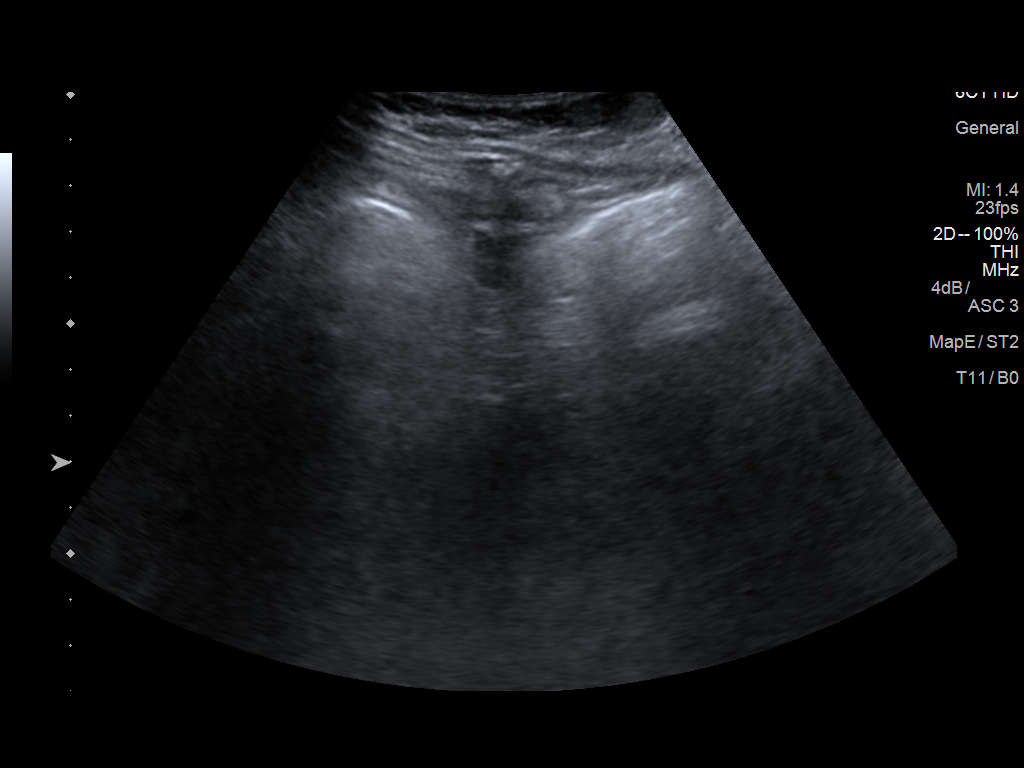
[im 3/4]
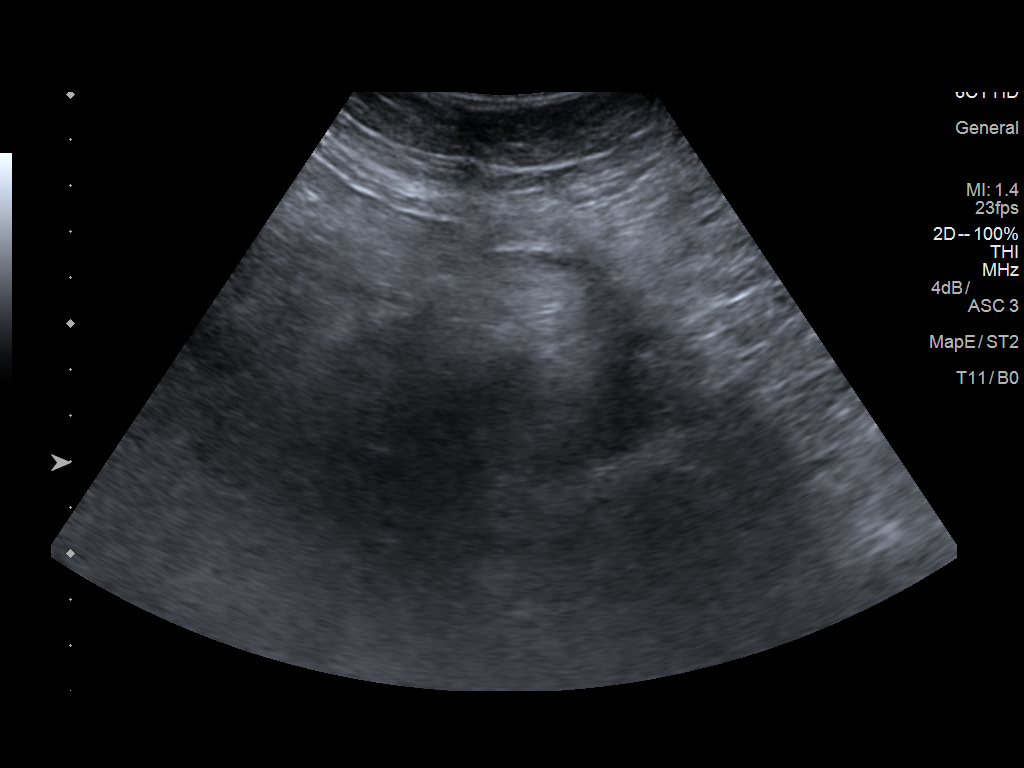
[im 4/4]
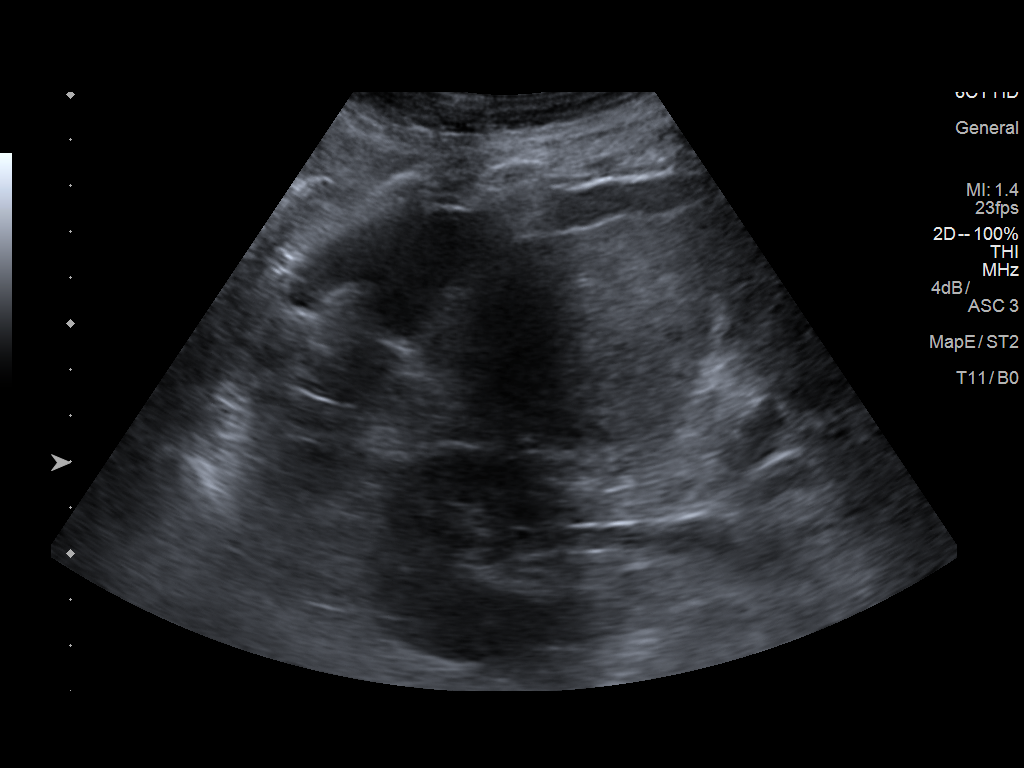

[4 of 4 positions shown; findings below may reference images not displayed]

FINDINGS: Sonographic evaluation of the abdomen demonstrates a trace amount of
perihepatic fluid, too small to allow for safe ultrasound-guided
paracentesis. No paracentesis attempted.
IMPRESSION: Trace amount of perihepatic ascites, too small to allow for safe
ultrasound-guided paracentesis. No paracentesis attempted
